# Patient Record
Sex: Female | Born: 1990 | Race: White | Hispanic: No | Marital: Single | Smoking: Never smoker
Health system: Southern US, Community
[De-identification: ages and names within clinical notes are randomized; demographics above are authoritative.]

## PROBLEM LIST (undated history)

## (undated) DIAGNOSIS — F419 Anxiety disorder, unspecified: Secondary | ICD-10-CM

## (undated) DIAGNOSIS — I10 Essential (primary) hypertension: Secondary | ICD-10-CM

## (undated) DIAGNOSIS — F32A Depression, unspecified: Secondary | ICD-10-CM

## (undated) DIAGNOSIS — R87629 Unspecified abnormal cytological findings in specimens from vagina: Secondary | ICD-10-CM

## (undated) DIAGNOSIS — E079 Disorder of thyroid, unspecified: Secondary | ICD-10-CM

## (undated) DIAGNOSIS — K219 Gastro-esophageal reflux disease without esophagitis: Secondary | ICD-10-CM

## (undated) HISTORY — DX: Depression, unspecified: F32.A

## (undated) HISTORY — PX: LEG SURGERY: SHX1003

## (undated) HISTORY — PX: CHOLECYSTECTOMY: SHX55

## (undated) HISTORY — DX: Unspecified abnormal cytological findings in specimens from vagina: R87.629

## (undated) HISTORY — DX: Disorder of thyroid, unspecified: E07.9

## (undated) HISTORY — PX: NO PAST SURGERIES: SHX2092

## (undated) HISTORY — DX: Anxiety disorder, unspecified: F41.9

## (undated) HISTORY — DX: Gastro-esophageal reflux disease without esophagitis: K21.9

---
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
## 2008-06-27 ENCOUNTER — Emergency Department (HOSPITAL_COMMUNITY): Admission: EM | Admit: 2008-06-27 | Discharge: 2008-06-27 | Payer: Self-pay | Admitting: Emergency Medicine

## 2009-02-10 ENCOUNTER — Other Ambulatory Visit: Admission: RE | Admit: 2009-02-10 | Discharge: 2009-02-10 | Payer: Self-pay | Admitting: Unknown Physician Specialty

## 2009-09-16 ENCOUNTER — Emergency Department (HOSPITAL_COMMUNITY): Admission: EM | Admit: 2009-09-16 | Discharge: 2009-09-16 | Payer: Self-pay | Admitting: Emergency Medicine

## 2009-12-19 ENCOUNTER — Emergency Department (HOSPITAL_COMMUNITY): Admission: EM | Admit: 2009-12-19 | Discharge: 2009-12-19 | Payer: Self-pay | Admitting: Emergency Medicine

## 2010-04-05 DIAGNOSIS — F909 Attention-deficit hyperactivity disorder, unspecified type: Secondary | ICD-10-CM | POA: Insufficient documentation

## 2010-04-05 DIAGNOSIS — F319 Bipolar disorder, unspecified: Secondary | ICD-10-CM | POA: Insufficient documentation

## 2010-04-06 LAB — URINALYSIS, ROUTINE W REFLEX MICROSCOPIC
Bilirubin Urine: NEGATIVE
Ketones, ur: NEGATIVE mg/dL
Nitrite: NEGATIVE
Specific Gravity, Urine: >1.03 — ABNORMAL HIGH (ref 1.005–1.030)
pH: 5.5 (ref 5.0–8.0)

## 2010-04-06 LAB — GLUCOSE, CAPILLARY: Glucose-Capillary: 87 mg/dL (ref 70–99)

## 2010-04-06 LAB — URINE MICROSCOPIC-ADD ON

## 2010-05-03 LAB — COMPREHENSIVE METABOLIC PANEL
BUN: 7 mg/dL (ref 6–23)
Chloride: 107 mEq/L (ref 96–112)
GFR calc non Af Amer: 55 mL/min — ABNORMAL LOW (ref >60)
Glucose, Bld: 126 mg/dL — ABNORMAL HIGH (ref 70–99)
Potassium: 3 mEq/L — ABNORMAL LOW (ref 3.5–5.1)
Total Bilirubin: 0.5 mg/dL (ref 0.3–1.2)

## 2010-05-03 LAB — URINALYSIS, ROUTINE W REFLEX MICROSCOPIC
Glucose, UA: NEGATIVE mg/dL
Nitrite: NEGATIVE
pH: 5.5 (ref 5.0–8.0)

## 2010-05-03 LAB — DIFFERENTIAL
Basophils Relative: 0 % (ref 0–1)
Eosinophils Relative: 1 % (ref 0–5)
Lymphs Abs: 1 10*3/uL (ref 0.7–4.0)
Monocytes Absolute: 0.3 10*3/uL (ref 0.1–1.0)
Monocytes Relative: 4 % (ref 3–12)

## 2010-05-03 LAB — CBC
Hemoglobin: 13.9 g/dL (ref 12.0–15.0)
Platelets: 210 10*3/uL (ref 150–400)
RBC: 4.61 MIL/uL (ref 3.87–5.11)
RDW: 13.1 % (ref 11.5–15.5)

## 2010-05-03 LAB — URINE MICROSCOPIC-ADD ON

## 2015-11-02 DIAGNOSIS — L989 Disorder of the skin and subcutaneous tissue, unspecified: Secondary | ICD-10-CM | POA: Insufficient documentation

## 2015-11-02 DIAGNOSIS — M722 Plantar fascial fibromatosis: Secondary | ICD-10-CM

## 2015-11-02 HISTORY — DX: Plantar fascial fibromatosis: M72.2

## 2017-05-01 DIAGNOSIS — E039 Hypothyroidism, unspecified: Secondary | ICD-10-CM | POA: Insufficient documentation

## 2017-11-18 ENCOUNTER — Emergency Department (HOSPITAL_COMMUNITY)
Admission: EM | Admit: 2017-11-18 | Discharge: 2017-11-18 | Disposition: A | Discharge status: Home / Self Care | Payer: Medicare Other | Attending: Emergency Medicine | Admitting: Emergency Medicine

## 2017-11-18 ENCOUNTER — Encounter (HOSPITAL_COMMUNITY): Payer: Self-pay | Admitting: *Deleted

## 2017-11-18 ENCOUNTER — Other Ambulatory Visit: Payer: Self-pay

## 2017-11-18 DIAGNOSIS — M5431 Sciatica, right side: Secondary | ICD-10-CM | POA: Insufficient documentation

## 2017-11-18 DIAGNOSIS — M545 Low back pain: Secondary | ICD-10-CM | POA: Diagnosis present

## 2017-11-18 MED ORDER — METHOCARBAMOL 500 MG PO TABS: 500.0000 mg | ORAL_TABLET | Freq: Two times a day (BID) | ORAL | 0 refills | Status: DC

## 2017-11-18 MED ORDER — DICLOFENAC SODIUM 50 MG PO TBEC: 50.0000 mg | DELAYED_RELEASE_TABLET | Freq: Two times a day (BID) | ORAL | 0 refills | Status: DC

## 2017-11-18 MED ORDER — PREDNISONE 10 MG PO TABS: ORAL_TABLET | ORAL | 0 refills | Status: DC

## 2017-11-18 NOTE — ED Triage Notes (Addendum)
Pt c/o lower back pain that radiates down into buttocks and legs. Pt c/o numbness and tingling in right leg. Symptoms started 4 weeks ago. Denies injury and urinary symptoms. Pt was seen at Urgent Care x 1 week ago with unknown diagnosis.

## 2017-11-18 NOTE — Discharge Instructions (Addendum)
Return if any problems.

## 2017-11-18 NOTE — ED Provider Notes (Signed)
[facility] EMERGENCY DEPARTMENT Provider Note   CSN: [id number] Arrival date & time: 11/18/17  1757     History   Chief Complaint Chief Complaint  Patient presents with  . Back Pain    HPI Madison Mcgrath is a 27 y.o. female.  The history is provided by the patient. No language interpreter was used.  Back Pain   This is a new problem. Episode onset: 4 weeks. The problem occurs constantly. The problem has been rapidly worsening. The pain is associated with no known injury. The pain is present in the lumbar spine. The pain does not radiate. The pain is moderate. She has tried nothing for the symptoms. The treatment provided moderate relief.    History reviewed. No pertinent past medical history.  There are no active problems to display for this patient.   Past Surgical History:  Procedure Laterality Date  . CHOLECYSTECTOMY       OB History   None      Home Medications    Prior to Admission medications   Not on File    Family History No family history on file.  Social History Social History   Tobacco Use  . Smoking status: Never Smoker  . Smokeless tobacco: Never Used  Substance Use Topics  . Alcohol use: Never    Frequency: Never  . Drug use: Never     Allergies   Patient has no known allergies.   Review of Systems Review of Systems  Musculoskeletal: Positive for back pain.  All other systems reviewed and are negative.    Physical Exam Updated Vital Signs BP (!) 147/90   Pulse (!) 107   Temp 98.3 F (36.8 C) (Oral)   Resp 18   Ht 5\' 5"  (1.651 m)   Wt (!) 151.5 kg   SpO2 98%   BMI 55.58 kg/m   Physical Exam  Constitutional: She appears well-developed and well-nourished.  Eyes: Pupils are equal, round, and reactive to light.  Neck: Normal range of motion.  Cardiovascular: Normal rate.  Pulmonary/Chest: Effort normal.  Abdominal: Soft.  Musculoskeletal: Normal range of motion.  Diffusely tender ls spine,  Pain with movement, dtr's  normal. Tender sciatic notch  Neurological: She is alert.  Skin: Skin is warm.  Psychiatric: She has a normal mood and affect.  Nursing note and vitals reviewed.    ED Treatments / Results  Labs (all labs ordered are listed, but only abnormal results are displayed) Labs Reviewed - No data to display  EKG None  Radiology No results found.  Procedures Procedures (including critical care time)  Medications Ordered in ED Medications - No data to display   Initial Impression / Assessment and Plan / ED Course  I have reviewed the triage vital signs and the nursing notes.  Pertinent labs & imaging results that were available during my care of the patient were reviewed by me and considered in my medical decision making (see chart for details).    MDm  I will try prednisone, robaxin and voltaren.  Pt advised to follow up if symptoms persist.   Final Clinical Impressions(s) / ED Diagnoses   Final diagnoses:  Sciatica of right side    ED Discharge Orders         Ordered    predniSONE (DELTASONE) 10 MG tablet     11/18/17 1905    methocarbamol (ROBAXIN) 500 MG tablet  2 times daily     11/18/17 1905    diclofenac (VOLTAREN) 50  MG EC tablet  2 times daily     11/18/17 1905        An After Visit Summary was printed and given to the patient.   Elson Areas, [place] 11/19/17 1536    Donnetta Hutching, MD 11/19/17 [id number]

## 2017-11-22 ENCOUNTER — Ambulatory Visit (INDEPENDENT_AMBULATORY_CARE_PROVIDER_SITE_OTHER): Payer: Medicare Other | Admitting: Obstetrics and Gynecology

## 2017-11-22 ENCOUNTER — Encounter: Payer: Self-pay | Admitting: Obstetrics and Gynecology

## 2017-11-22 DIAGNOSIS — F529 Unspecified sexual dysfunction not due to a substance or known physiological condition: Secondary | ICD-10-CM

## 2017-11-22 HISTORY — DX: Unspecified sexual dysfunction not due to a substance or known physiological condition: F52.9
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
## 2017-11-22 NOTE — Patient Instructions (Signed)

## 2018-07-31 ENCOUNTER — Other Ambulatory Visit: Payer: Self-pay

## 2018-07-31 ENCOUNTER — Emergency Department (HOSPITAL_COMMUNITY)
Admission: EM | Admit: 2018-07-31 | Discharge: 2018-08-01 | Disposition: A | Discharge status: Home / Self Care | Payer: 59 | Attending: Emergency Medicine | Admitting: Emergency Medicine

## 2018-07-31 ENCOUNTER — Encounter (HOSPITAL_COMMUNITY): Payer: Self-pay | Admitting: *Deleted

## 2018-07-31 DIAGNOSIS — Z79899 Other long term (current) drug therapy: Secondary | ICD-10-CM | POA: Diagnosis not present

## 2018-07-31 DIAGNOSIS — E039 Hypothyroidism, unspecified: Secondary | ICD-10-CM | POA: Diagnosis not present

## 2018-07-31 DIAGNOSIS — M791 Myalgia, unspecified site: Secondary | ICD-10-CM | POA: Insufficient documentation

## 2018-07-31 DIAGNOSIS — R531 Weakness: Secondary | ICD-10-CM | POA: Diagnosis present

## 2018-07-31 DIAGNOSIS — I10 Essential (primary) hypertension: Secondary | ICD-10-CM | POA: Diagnosis not present

## 2018-07-31 DIAGNOSIS — R079 Chest pain, unspecified: Secondary | ICD-10-CM | POA: Insufficient documentation

## 2018-07-31 HISTORY — DX: Essential (primary) hypertension: I10

## 2018-07-31 LAB — URINALYSIS, ROUTINE W REFLEX MICROSCOPIC
Bilirubin Urine: NEGATIVE
Glucose, UA: NEGATIVE mg/dL
Hgb urine dipstick: NEGATIVE
Ketones, ur: NEGATIVE mg/dL
Nitrite: NEGATIVE
Protein, ur: NEGATIVE mg/dL
Specific Gravity, Urine: 1.031 — ABNORMAL HIGH (ref 1.005–1.030)
pH: 5 (ref 5.0–8.0)

## 2018-07-31 LAB — CBC
HCT: 44.8 % (ref 36.0–46.0)
Hemoglobin: 13.9 g/dL (ref 12.0–15.0)
MCH: 26.4 pg (ref 26.0–34.0)
MCHC: 31 g/dL (ref 30.0–36.0)
MCV: 85.2 fL (ref 80.0–100.0)
Platelets: 280 10*3/uL (ref 150–400)
RBC: 5.26 MIL/uL — ABNORMAL HIGH (ref 3.87–5.11)
RDW: 13.7 % (ref 11.5–15.5)
WBC: 11.5 10*3/uL — ABNORMAL HIGH (ref 4.0–10.5)
nRBC: 0 % (ref 0.0–0.2)

## 2018-07-31 LAB — BASIC METABOLIC PANEL
Anion gap: 10 (ref 5–15)
BUN: 11 mg/dL (ref 6–20)
CO2: 22 mmol/L (ref 22–32)
Calcium: 9.2 mg/dL (ref 8.9–10.3)
Chloride: 108 mmol/L (ref 98–111)
Creatinine, Ser: 0.77 mg/dL (ref 0.44–1.00)
GFR calc Af Amer: >60 mL/min (ref >60)
GFR calc non Af Amer: >60 mL/min (ref >60)
Glucose, Bld: 123 mg/dL — ABNORMAL HIGH (ref 70–99)
Potassium: 3.9 mmol/L (ref 3.5–5.1)
Sodium: 140 mmol/L (ref 135–145)

## 2018-07-31 LAB — CK: Total CK: 98 U/L (ref 38–234)

## 2018-07-31 LAB — CBG MONITORING, ED: Glucose-Capillary: 116 mg/dL — ABNORMAL HIGH (ref 70–99)

## 2018-07-31 LAB — POC URINE PREG, ED: Preg Test, Ur: NEGATIVE

## 2018-07-31 MED ORDER — SODIUM CHLORIDE 0.9 % IV BOLUS
1000.0000 mL | Freq: Once | INTRAVENOUS | Status: AC
  Administered 2018-07-31: 1000 mL via INTRAVENOUS

## 2018-07-31 MED ORDER — SODIUM CHLORIDE 0.9% FLUSH: 3.0000 mL | Freq: Once | INTRAVENOUS | Status: DC

## 2018-07-31 NOTE — ED Triage Notes (Signed)
Pt states she has been moving into a new place for the last 2 days and she has not been feeling well; pt states the place she is living does not have air conditioning and she feels like she may be dehydrated; pt also c/o chest pain

## 2018-08-01 ENCOUNTER — Emergency Department (HOSPITAL_COMMUNITY): Payer: 59

## 2018-08-01 DIAGNOSIS — R531 Weakness: Secondary | ICD-10-CM | POA: Diagnosis not present

## 2018-08-01 LAB — TROPONIN I (HIGH SENSITIVITY)
Troponin I (High Sensitivity): <2 ng/L (ref <18)
Troponin I (High Sensitivity): <2 ng/L (ref <18)

## 2018-08-01 MED ORDER — DICLOFENAC SODIUM 75 MG PO TBEC: 75.0000 mg | DELAYED_RELEASE_TABLET | Freq: Two times a day (BID) | ORAL | 0 refills | Status: DC

## 2018-08-01 MED ORDER — KETOROLAC TROMETHAMINE 30 MG/ML IJ SOLN
15.0000 mg | Freq: Once | INTRAMUSCULAR | Status: AC
  Administered 2018-08-01: 15 mg via INTRAVENOUS
  Filled 2018-08-01: qty 1

## 2018-08-01 NOTE — ED Provider Notes (Signed)
[facility] EMERGENCY DEPARTMENT Provider Note   CSN: [id number] Arrival date & time: 07/31/18  1858    History   Chief Complaint Chief Complaint  Patient presents with  . Weakness    HPI Madison Mcgrath is a 28 y.o. female with a history of HTN and hypothyroidism presenting with suspicion of dehydration or heat illness. She is in the process of moving into a new home and has been more active than normal, having to go up and down flights of steps multiple times in a hot home that is not air conditioned.  This is more exercise than she is used to and feels dehydrated and weak.  She has been sweating all day and reports decreased urination but her urine has not been especially dark.  She also developed mild sternal pressure in her chest while trying to rest this evening. Her pain is not pleuritic. She denies sob, cough, fevers, no n/v.  She does report bilateral leg pain, probably from exertion.       Weakness Associated symptoms: arthralgias   Associated symptoms: no shortness of breath     Past Medical History:  Diagnosis Date  . Hypertension   . Thyroid disease     Patient Active Problem List   Diagnosis Date Noted  . 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 CHOLECYSTECTOMY       OB History    Gravida  0   Para  0   Term  0   Preterm  0   AB  0   Living  0     SAB  0   TAB  0   Ectopic  0   Multiple  0   Live Births  0            Home Medications    Prior to Admission medications   Medication Sig Start Date End Date Taking? Authorizing Provider  amLODipine (NORVASC) 5 MG tablet TAKE 1 TABLET BY MOUTH EVERY DAY 02/14/17   [provider]  diclofenac (VOLTAREN) 75 MG EC tablet Take 1 tablet (75 mg total) by mouth 2 (two) times daily. 08/01/18   Burgess AmorIdol, Tamsin Nader, PA-C  lamoTRIgine (LAMICTAL) 200 MG tablet TK 1 T PO  DAILY 11/10/17   [provider]  levothyroxine (SYNTHROID,  LEVOTHROID) 50 MCG tablet Take by mouth. 03/09/17   [provider]  traZODone (DESYREL) 150 MG tablet Take by mouth.    [provider]    Family History History reviewed. No pertinent family history.  Social History Social History   Tobacco Use  . Smoking status: Never Smoker  . Smokeless tobacco: Never Used  Substance Use Topics  . Alcohol use: Never    Frequency: Never  . Drug use: Never     Allergies   Bee venom   Review of Systems Review of Systems  Constitutional: Positive for diaphoresis and fatigue.  HENT: Negative.   Respiratory: Positive for chest tightness. Negative for shortness of breath and wheezing.   Gastrointestinal: Negative.   Genitourinary: Positive for decreased urine volume.  Musculoskeletal: Positive for arthralgias.  Neurological: Positive for weakness.     Physical Exam Updated Vital Signs BP 126/73   Pulse 86   Temp 98.2 F (36.8 C) (Oral)   Resp 18   Ht 5\' 5"  (1.651 m)   Wt (!) 158.8 kg   SpO2 100%   BMI 58.24 kg/m   Physical Exam Vitals signs  and nursing note reviewed.  Constitutional:      Appearance: She is well-developed. She is obese.  HENT:     Head: Normocephalic and atraumatic.     Mouth/Throat:     Mouth: Mucous membranes are dry.  Eyes:     Conjunctiva/sclera: Conjunctivae normal.  Neck:     Musculoskeletal: Normal range of motion.  Cardiovascular:     Rate and Rhythm: Normal rate and regular rhythm.     Heart sounds: Normal heart sounds.  Pulmonary:     Effort: Pulmonary effort is normal.     Breath sounds: Normal breath sounds. No wheezing.  Abdominal:     General: Bowel sounds are normal.     Palpations: Abdomen is soft.     Tenderness: There is no abdominal tenderness.  Musculoskeletal: Normal range of motion.  Skin:    General: Skin is warm and dry.  Neurological:     Mental Status: She is alert.      ED Treatments / Results  Labs (all labs ordered are listed, but only  abnormal results are displayed) Labs Reviewed  BASIC METABOLIC PANEL - Abnormal; Notable for the following components:      Result Value   Glucose, Bld 123 (*)    All other components within normal limits  CBC - Abnormal; Notable for the following components:   WBC 11.5 (*)    RBC 5.26 (*)    All other components within normal limits  URINALYSIS, ROUTINE W REFLEX MICROSCOPIC - Abnormal; Notable for the following components:   APPearance HAZY (*)    Specific Gravity, Urine 1.031 (*)    Leukocytes,Ua TRACE (*)    Bacteria, UA RARE (*)    All other components within normal limits  CBG MONITORING, ED - Abnormal; Notable for the following components:   Glucose-Capillary 116 (*)    All other components within normal limits  CK  TROPONIN I (HIGH SENSITIVITY)  TROPONIN I (HIGH SENSITIVITY)  POC URINE PREG, ED    EKG EKG Interpretation  Date/Time:  Tuesday July 31 2018 19:46:03 EDT Ventricular Rate:  88 PR Interval:  142 QRS Duration: 84 QT Interval:  376 QTC Calculation: 454 R Axis:   102 Text Interpretation:  Normal sinus rhythm Rightward axis Borderline ECG When compared with ECG of 06/22/1994, No significant change was found Confirmed by Delora Fuel ([id number]) on 08/01/2018 12:21:41 AM   Radiology Dg Chest Portable 1 View  Result Date: 08/01/2018 CLINICAL DATA:  Chest pain. EXAM: PORTABLE CHEST 1 VIEW COMPARISON:  None. FINDINGS: The heart size is enlarged. There is no pneumothorax. No large pleural effusion. There is mild vascular congestion without overt pulmonary edema. There is no acute osseous abnormality. IMPRESSION: 1. No acute cardiopulmonary process. 2. Mild cardiomegaly. Electronically Signed   By: Constance Holster M.D.   On: 08/01/2018 01:00    Procedures Procedures (including critical care time)  Medications Ordered in ED Medications  sodium chloride flush (NS) 0.9 % injection 3 mL (3 mLs Intravenous Not Given 07/31/18 2231)  sodium chloride 0.9 % bolus 1,000 mL (0  mLs Intravenous Stopped 08/01/18 0156)  ketorolac (TORADOL) 30 MG/ML injection 15 mg (15 mg Intravenous Given 08/01/18 0016)     Initial Impression / Assessment and Plan / ED Course  I have reviewed the triage vital signs and the nursing notes.  Pertinent labs & imaging results that were available during my care of the patient were reviewed by me and considered in my medical decision making (see chart  for details).        Pt with fatigue, chest pressure of unclear etiology, bilateral leg pain suspected from overuse. No sob, no pleuritic sx, doubt PE, VSS.  Ekg, cxr and delta trops low.  She is scheduled to see a new pcp in 2 weeks. Advised rest, increased fluid intake. She asked for diclofenac for her leg pain which she has used in the past for orthopedic pain and has been helpful.  She was given toradol here with relief of her leg pain.   Final Clinical Impressions(s) / ED Diagnoses   Final diagnoses:  Generalized weakness  Muscular aches  Chest pain, unspecified type    ED Discharge Orders         Ordered    diclofenac (VOLTAREN) 75 MG EC tablet  2 times daily     08/01/18 0249           Burgess Amordol, Maycol Hoying, PA-C 08/01/18 0251    Sabas SousBero, Michael M, MD 08/06/18 [phone]

## 2018-08-01 NOTE — Discharge Instructions (Signed)
Your lab tests, chest xray and ekg are stable tonight.  Followup with your doctor as planned.  Rest and drink plenty of fluids.  You may take the diclofenac for your leg pain.

## 2018-08-14 ENCOUNTER — Emergency Department (HOSPITAL_COMMUNITY)
Admission: EM | Admit: 2018-08-14 | Discharge: 2018-08-14 | Disposition: A | Discharge status: Home / Self Care | Payer: 59 | Attending: Emergency Medicine | Admitting: Emergency Medicine

## 2018-08-14 ENCOUNTER — Emergency Department (HOSPITAL_COMMUNITY): Payer: 59

## 2018-08-14 ENCOUNTER — Encounter (HOSPITAL_COMMUNITY): Payer: Self-pay | Admitting: *Deleted

## 2018-08-14 ENCOUNTER — Other Ambulatory Visit: Payer: Self-pay

## 2018-08-14 DIAGNOSIS — S46911A Strain of unspecified muscle, fascia and tendon at shoulder and upper arm level, right arm, initial encounter: Secondary | ICD-10-CM | POA: Insufficient documentation

## 2018-08-14 DIAGNOSIS — S76919A Strain of unspecified muscles, fascia and tendons at thigh level, unspecified thigh, initial encounter: Secondary | ICD-10-CM | POA: Insufficient documentation

## 2018-08-14 DIAGNOSIS — Y999 Unspecified external cause status: Secondary | ICD-10-CM | POA: Diagnosis not present

## 2018-08-14 DIAGNOSIS — I10 Essential (primary) hypertension: Secondary | ICD-10-CM | POA: Diagnosis not present

## 2018-08-14 DIAGNOSIS — W010XXA Fall on same level from slipping, tripping and stumbling without subsequent striking against object, initial encounter: Secondary | ICD-10-CM | POA: Insufficient documentation

## 2018-08-14 DIAGNOSIS — Y9372 Activity, wrestling: Secondary | ICD-10-CM | POA: Insufficient documentation

## 2018-08-14 DIAGNOSIS — Y92009 Unspecified place in unspecified non-institutional (private) residence as the place of occurrence of the external cause: Secondary | ICD-10-CM | POA: Insufficient documentation

## 2018-08-14 DIAGNOSIS — Z79899 Other long term (current) drug therapy: Secondary | ICD-10-CM | POA: Diagnosis not present

## 2018-08-14 DIAGNOSIS — S4991XA Unspecified injury of right shoulder and upper arm, initial encounter: Secondary | ICD-10-CM | POA: Diagnosis present

## 2018-08-14 LAB — POC URINE PREG, ED: Preg Test, Ur: NEGATIVE

## 2018-08-14 MED ORDER — DICLOFENAC SODIUM 75 MG PO TBEC: 75.0000 mg | DELAYED_RELEASE_TABLET | Freq: Two times a day (BID) | ORAL | 0 refills | Status: DC

## 2018-08-14 MED ORDER — CYCLOBENZAPRINE HCL 10 MG PO TABS: 10.0000 mg | ORAL_TABLET | Freq: Three times a day (TID) | ORAL | 0 refills | Status: DC

## 2018-08-14 NOTE — Discharge Instructions (Addendum)
Your x-rays are negative for any acute bony injury or dislocation.  I suspect you have strained some muscles which should improve with time.  You may apply a heating pad to your sites of pain for 20 minutes several times daily.  Use the medications prescribed for muscle spasm and anti-inflammatory pain relief.  Plan to follow-up with your new primary doctor this week as you have scheduled.  Your blood pressure is elevated today and may be secondary to this painful condition, however make sure you are taking your blood pressure medicine daily.
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
## 2018-08-14 NOTE — ED Notes (Signed)
Patient transported to X-ray 
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
## 2018-08-14 NOTE — ED Notes (Signed)
Patient back from x-ray 

## 2018-08-24 ENCOUNTER — Telehealth: Payer: Self-pay

## 2018-08-24 NOTE — Telephone Encounter (Signed)
Proficient Referral - Left Message.  

## 2018-09-03 ENCOUNTER — Telehealth: Payer: Self-pay | Admitting: Women's Health

## 2018-09-03 NOTE — Telephone Encounter (Signed)

## 2018-09-04 ENCOUNTER — Encounter: Payer: 59 | Admitting: Women's Health

## 2018-10-04 ENCOUNTER — Telehealth (INDEPENDENT_AMBULATORY_CARE_PROVIDER_SITE_OTHER): Payer: Self-pay

## 2018-10-05 ENCOUNTER — Ambulatory Visit (HOSPITAL_COMMUNITY): Payer: 59 | Admitting: Psychiatry

## 2018-10-08 NOTE — Telephone Encounter (Signed)
Message send & reviewed.

## 2018-10-12 ENCOUNTER — Telehealth (INDEPENDENT_AMBULATORY_CARE_PROVIDER_SITE_OTHER): Payer: Self-pay

## 2018-10-15 ENCOUNTER — Telehealth: Payer: Self-pay | Admitting: Women's Health

## 2018-10-15 NOTE — Telephone Encounter (Signed)
COMPLETED

## 2018-10-15 NOTE — Telephone Encounter (Signed)
Called patient regarding appointment and the following message was left: ° ° °We have you scheduled for an upcoming appointment at our office. At this time, patients are encouraged to come alone to their visits whenever possible, however, a support person, over age 30, may accompany you to your appointment if assistance is needed for safety or care concerns. Otherwise, support persons should remain outside until the visit is complete.  ° °We ask if you have had any exposure to anyone suspected or confirmed of having COVID-19 or if you are experiencing any of the following, to call and reschedule your appointment: fever, cough, shortness of breath, muscle pain, diarrhea, rash, vomiting, abdominal pain, red eye, weakness, bruising, bleeding, joint pain, or a severe headache.  ° °Please know we will ask you these questions or similar questions when you arrive for your appointment and again it’s how we are keeping everyone safe.   ° °Also,to keep you safe, please use the provided hand sanitizer when you enter the office. We are asking everyone in the office to wear a mask to help prevent the spread of °germs. If you have a mask of your own, please wear it to your appointment, if not, we are happy to provide one for you. ° °Thank you for understanding and your cooperation.  ° ° °[facility] Staff ° ° ° ° ° °

## 2018-10-16 ENCOUNTER — Other Ambulatory Visit (HOSPITAL_COMMUNITY)
Admission: RE | Admit: 2018-10-16 | Discharge: 2018-10-16 | Disposition: A | Discharge status: Home / Self Care | Payer: 59 | Source: Ambulatory Visit | Attending: Obstetrics & Gynecology | Admitting: Obstetrics & Gynecology

## 2018-10-16 ENCOUNTER — Ambulatory Visit (INDEPENDENT_AMBULATORY_CARE_PROVIDER_SITE_OTHER): Payer: 59 | Admitting: Women's Health

## 2018-10-16 ENCOUNTER — Encounter: Payer: 59 | Admitting: Women's Health

## 2018-10-16 ENCOUNTER — Other Ambulatory Visit: Payer: Self-pay

## 2018-10-16 ENCOUNTER — Encounter: Payer: Self-pay | Admitting: Women's Health

## 2018-10-16 VITALS — Ht 65.0 in | Wt 364.0 lb

## 2018-10-16 DIAGNOSIS — Z01419 Encounter for gynecological examination (general) (routine) without abnormal findings: Secondary | ICD-10-CM | POA: Diagnosis not present

## 2018-10-16 DIAGNOSIS — R37 Sexual dysfunction, unspecified: Secondary | ICD-10-CM

## 2018-10-16 DIAGNOSIS — Z62810 Personal history of physical and sexual abuse in childhood: Secondary | ICD-10-CM
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
## 2018-10-16 NOTE — Patient Instructions (Signed)
[email or web address] [phone]

## 2018-10-17 LAB — CYTOLOGY - PAP
Chlamydia: NEGATIVE
Diagnosis: NEGATIVE
High risk HPV: NEGATIVE
Molecular Disclaimer: 56
Molecular Disclaimer: DETECTED
Molecular Disclaimer: NEGATIVE
Molecular Disclaimer: NORMAL
Neisseria Gonorrhea: NEGATIVE

## 2018-10-18 LAB — CERVICOVAGINAL ANCILLARY ONLY
Chlamydia: NEGATIVE
HPV: NOT DETECTED
Neisseria Gonorrhea: NEGATIVE

## 2018-11-08 ENCOUNTER — Ambulatory Visit (INDEPENDENT_AMBULATORY_CARE_PROVIDER_SITE_OTHER): Payer: 59 | Admitting: Internal Medicine

## 2018-11-30 ENCOUNTER — Telehealth: Payer: 59 | Admitting: Nurse Practitioner

## 2018-11-30 DIAGNOSIS — R059 Cough, unspecified: Secondary | ICD-10-CM

## 2018-11-30 DIAGNOSIS — R05 Cough: Secondary | ICD-10-CM

## 2018-11-30 MED ORDER — BENZONATATE 100 MG PO CAPS: 100.0000 mg | ORAL_CAPSULE | Freq: Three times a day (TID) | ORAL | 0 refills | Status: DC | PRN

## 2018-11-30 NOTE — Progress Notes (Signed)

## 2019-02-05 ENCOUNTER — Encounter (HOSPITAL_COMMUNITY): Payer: Self-pay | Admitting: Emergency Medicine

## 2019-02-05 ENCOUNTER — Other Ambulatory Visit: Payer: Self-pay

## 2019-02-05 ENCOUNTER — Emergency Department (HOSPITAL_COMMUNITY)
Admission: EM | Admit: 2019-02-05 | Discharge: 2019-02-05 | Disposition: A | Discharge status: Home / Self Care | Payer: 59 | Attending: Emergency Medicine | Admitting: Emergency Medicine

## 2019-02-05 ENCOUNTER — Telehealth: Payer: Self-pay | Admitting: *Deleted

## 2019-02-05 DIAGNOSIS — I10 Essential (primary) hypertension: Secondary | ICD-10-CM | POA: Diagnosis not present

## 2019-02-05 DIAGNOSIS — N939 Abnormal uterine and vaginal bleeding, unspecified: Secondary | ICD-10-CM

## 2019-02-05 DIAGNOSIS — Z79899 Other long term (current) drug therapy: Secondary | ICD-10-CM | POA: Insufficient documentation

## 2019-02-05 LAB — CBC WITH DIFFERENTIAL/PLATELET
Abs Immature Granulocytes: 0.06 10*3/uL (ref 0.00–0.07)
Basophils Absolute: 0.1 10*3/uL (ref 0.0–0.1)
Basophils Relative: 1 %
Eosinophils Absolute: 0 10*3/uL (ref 0.0–0.5)
Eosinophils Relative: 0 %
HCT: 48.2 % — ABNORMAL HIGH (ref 36.0–46.0)
Hemoglobin: 14.6 g/dL (ref 12.0–15.0)
Immature Granulocytes: 1 %
Lymphocytes Relative: 34 %
Lymphs Abs: 4.1 10*3/uL — ABNORMAL HIGH (ref 0.7–4.0)
MCH: 26.5 pg (ref 26.0–34.0)
MCHC: 30.3 g/dL (ref 30.0–36.0)
MCV: 87.6 fL (ref 80.0–100.0)
Monocytes Absolute: 0.6 10*3/uL (ref 0.1–1.0)
Monocytes Relative: 5 %
Neutro Abs: 7.3 10*3/uL (ref 1.7–7.7)
Neutrophils Relative %: 59 %
Platelets: 318 10*3/uL (ref 150–400)
RBC: 5.5 MIL/uL — ABNORMAL HIGH (ref 3.87–5.11)
RDW: 13.3 % (ref 11.5–15.5)
WBC: 12.1 10*3/uL — ABNORMAL HIGH (ref 4.0–10.5)
nRBC: 0 % (ref 0.0–0.2)

## 2019-02-05 LAB — BASIC METABOLIC PANEL
Anion gap: 11 (ref 5–15)
BUN: 13 mg/dL (ref 6–20)
CO2: 24 mmol/L (ref 22–32)
Calcium: 9.4 mg/dL (ref 8.9–10.3)
Chloride: 105 mmol/L (ref 98–111)
Creatinine, Ser: 0.66 mg/dL (ref 0.44–1.00)
GFR calc Af Amer: >60 mL/min (ref >60)
GFR calc non Af Amer: >60 mL/min (ref >60)
Glucose, Bld: 118 mg/dL — ABNORMAL HIGH (ref 70–99)
Potassium: 3.8 mmol/L (ref 3.5–5.1)
Sodium: 140 mmol/L (ref 135–145)

## 2019-02-05 LAB — PREGNANCY, URINE: Preg Test, Ur: NEGATIVE

## 2019-02-05 NOTE — ED Notes (Signed)
Out of bed to br 

## 2019-02-05 NOTE — ED Notes (Signed)
Pt reports she has had a vag bleed that is heavier than usual   Implant BC x 1 year   Reports has not had money for pads  Nor has she had any luck calling her GYN provider stating she has left messages but no return call   Here for eval or her heavy vag bleeding

## 2019-02-05 NOTE — Telephone Encounter (Signed)
Tried to return call. [phone]#, mailbox was full; 214#, voice mail not set up. JSY

## 2019-02-05 NOTE — ED Notes (Signed)
JI in to assess 

## 2019-02-05 NOTE — Telephone Encounter (Signed)
Patient left message that she has normally had a light period but now has a heavy period and would like to discuss. Please call.

## 2019-02-05 NOTE — ED Triage Notes (Addendum)
Pt reports period started on 1/9. Pt reports this period is heavier than usual and has "several clots." pt denies abd pain but reports general malaise and fatigue. Pt reports has implant for birth control.

## 2019-02-05 NOTE — ED Provider Notes (Signed)
[facility] EMERGENCY DEPARTMENT Provider Note   CSN: [id number] Arrival date & time: 02/05/19  1748     History Chief Complaint  Patient presents with  . Vaginal Bleeding    Madison Mcgrath is a 29 y.o. female with a history of hypertension and thyroid disease presenting with a heavier than normal.  Including passage of blood clots which started 2 mornings ago.  She has a Nexplanon for birth control and states she typically has very light if any.,  Stating in the past 6 months she has only had 1 or 2 episodes of transient spotting without a true period.  She denies abdominal or pelvic pain or cramping, also denies dizziness or weakness.  She reports having to change her pad every 1-2 hours for the past 12 hours.  [facility], states she attempted to call but has not heard back.  She has had no treatments prior to arrival for her symptoms.  She denies possibility of pregnancy, reports she is not sexually active.  Denies vaginal discharge.  The history is provided by the patient.       Past Medical History:  Diagnosis Date  . Hypertension   . Thyroid disease     Patient Active Problem List   Diagnosis Date Noted  . 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 CHOLECYSTECTOMY       OB History    Gravida  0   Para  0   Term  0   Preterm  0   AB  0   Living  0     SAB  0   TAB  0   Ectopic  0   Multiple  0   Live Births  0           No family history on file.  Social History   Tobacco Use  . Smoking status: Never Smoker  . Smokeless tobacco: Never Used  Substance Use Topics  . Alcohol use: Never  . Drug use: Never    Home Medications Prior to Admission medications   Medication Sig Start Date End Date Taking? Authorizing Provider  amLODipine (NORVASC) 5 MG tablet TAKE 1 TABLET BY MOUTH EVERY DAY 02/14/17   [provider]  benzonatate (TESSALON PERLES) 100 MG  capsule Take 1 capsule (100 mg total) by mouth 3 (three) times daily as needed. 11/30/18   Daphine Deutscher Mary-Margaret, FNP  cyclobenzaprine (FLEXERIL) 10 MG tablet Take 1 tablet (10 mg total) by mouth 3 (three) times daily. 08/14/18   Burgess Amor, PA-C  diclofenac (VOLTAREN) 75 MG EC tablet Take 1 tablet (75 mg total) by mouth 2 (two) times daily. 08/14/18   Burgess Amor, PA-C  famotidine (PEPCID) 20 MG tablet  08/16/18   [provider]  lamoTRIgine (LAMICTAL) 200 MG tablet TK 1 T PO  DAILY 11/10/17   [provider]  levothyroxine (SYNTHROID, LEVOTHROID) 50 MCG tablet Take by mouth. 03/09/17   [provider]  traZODone (DESYREL) 150 MG tablet Take by mouth.    [provider]  VRAYLAR capsule  08/08/18   [provider]    Allergies    Bee venom  Review of Systems   Review of Systems  Constitutional: Negative for fever.  HENT: Negative for congestion and sore throat.   Eyes: Negative.   Respiratory: Negative for chest tightness and shortness of breath.   Cardiovascular: Negative for chest pain.  Gastrointestinal:  Negative for abdominal pain and nausea.  Genitourinary: Positive for vaginal bleeding. Negative for pelvic pain, vaginal discharge and vaginal pain.  Musculoskeletal: Negative for arthralgias, joint swelling and neck pain.  Skin: Negative.  Negative for rash and wound.  Neurological: Negative for dizziness, weakness, light-headedness, numbness and headaches.  Psychiatric/Behavioral: Negative.     Physical Exam Updated Vital Signs BP (!) 146/93 (BP Location: Right Wrist)   Pulse 98   Temp 98.9 F (37.2 C) (Oral)   Resp 16   Ht 5\' 5"  (1.651 m)   Wt (!) 158.8 kg   LMP 02/02/2019   SpO2 100%   BMI 58.24 kg/m   Physical Exam Vitals and nursing note reviewed. Exam conducted with a chaperone present.  Constitutional:      Appearance: She is well-developed. She is obese.  HENT:     Head: Normocephalic and atraumatic.  Eyes:      Conjunctiva/sclera: Conjunctivae normal.  Cardiovascular:     Rate and Rhythm: Normal rate and regular rhythm.     Heart sounds: Normal heart sounds.  Pulmonary:     Effort: Pulmonary effort is normal.     Breath sounds: Normal breath sounds. No wheezing.  Abdominal:     General: Bowel sounds are normal.     Palpations: Abdomen is soft.     Tenderness: There is no abdominal tenderness.  Genitourinary:    General: Normal vulva.     Vagina: Normal. No signs of injury.     Cervix: No cervical motion tenderness, discharge or erythema.     Uterus: Normal. Not tender.      Adnexa: Right adnexa normal and left adnexa normal.       Right: No tenderness.         Left: No tenderness.       Comments: Blood and small clot noted at cervical os.  Musculoskeletal:        General: Normal range of motion.     Cervical back: Normal range of motion.  Skin:    General: Skin is warm and dry.  Neurological:     Mental Status: She is alert.     ED Results / Procedures / Treatments   Labs (all labs ordered are listed, but only abnormal results are displayed) Labs Reviewed  CBC WITH DIFFERENTIAL/PLATELET - Abnormal; Notable for the following components:      Result Value   WBC 12.1 (*)    RBC 5.50 (*)    HCT 48.2 (*)    Lymphs Abs 4.1 (*)    All other components within normal limits  BASIC METABOLIC PANEL - Abnormal; Notable for the following components:   Glucose, Bld 118 (*)    All other components within normal limits  PREGNANCY, URINE    EKG None  Radiology No results found.  Procedures Procedures (including critical care time)  Medications Ordered in ED Medications - No data to display  ED Course  I have reviewed the triage vital signs and the nursing notes.  Pertinent labs & imaging results that were available during my care of the patient were reviewed by me and considered in my medical decision making (see chart for details).    MDM Rules/Calculators/A&P                       Pt with abnormal vaginal bleeding 3 days duration with normal hgb level and no weakness or dizziness which is reassuring.  Patient is probably simply having a heavier  than normal menses  Given her multiple months of missed cycles.  She is not pregnant.  No risk factors for STDs.  She was encouraged a follow-up conversation with her gynecologist if her symptoms persist beyond 7 days.  At this point there is no indication for intervention as she is asymptomatic and not anemic.  She was given strict return precautions however if symptoms worsen or she develops weakness or dizziness.  Final Clinical Impression(s) / ED Diagnoses Final diagnoses:  Abnormal uterine bleeding    Rx / DC Orders ED Discharge Orders    None       Victoriano Lain 02/07/19 0114    Maia Plan, MD 02/07/19 1252

## 2019-02-06 ENCOUNTER — Encounter: Payer: Self-pay | Admitting: Radiology

## 2019-02-08 ENCOUNTER — Encounter: Payer: Self-pay | Admitting: Radiology

## 2019-02-11 ENCOUNTER — Ambulatory Visit: Payer: 59 | Admitting: Orthopedic Surgery

## 2019-02-12 ENCOUNTER — Ambulatory Visit: Payer: 59 | Admitting: Orthopedic Surgery

## 2019-02-14 ENCOUNTER — Ambulatory Visit: Payer: 59 | Admitting: Obstetrics & Gynecology

## 2019-02-15 ENCOUNTER — Other Ambulatory Visit: Payer: Self-pay

## 2019-02-15 ENCOUNTER — Encounter: Payer: Self-pay | Admitting: Orthopedic Surgery

## 2019-02-15 ENCOUNTER — Ambulatory Visit (INDEPENDENT_AMBULATORY_CARE_PROVIDER_SITE_OTHER): Payer: 59 | Admitting: Orthopedic Surgery

## 2019-02-15 ENCOUNTER — Ambulatory Visit: Payer: 59

## 2019-02-15 VITALS — BP 115/78 | HR 92 | Temp 97.5°F | Ht 65.0 in | Wt 355.0 lb

## 2019-02-15 DIAGNOSIS — M25561 Pain in right knee: Secondary | ICD-10-CM

## 2019-02-15 DIAGNOSIS — M5136 Other intervertebral disc degeneration, lumbar region: Secondary | ICD-10-CM

## 2019-02-15 DIAGNOSIS — G8929 Other chronic pain: Secondary | ICD-10-CM

## 2019-02-15 DIAGNOSIS — M25562 Pain in left knee: Secondary | ICD-10-CM

## 2019-02-15 NOTE — Patient Instructions (Signed)
Madison Mcgrath we have 3 issues here 1 is your weight which are working on  1 is your back is causing her legs to give out and the other problem is that your knees have arthritis  Take your diclofenac daily  Physical therapy  No surgery needed

## 2019-02-15 NOTE — Progress Notes (Addendum)
Madison Mcgrath  02/15/2019  Body mass index is 59.08 kg/m.   HISTORY SECTION :  Chief Complaint  Patient presents with  . Knee Pain    Bilateral knee pain.   29 year old female with hypothyroidism morbid obesity presents for evaluation of bilateral knee pain and complaints of her knees popping out.  She says this began when she was a child and she has had trouble with them ever since.  She does complain of increased pain with weightbearing activities and she has difficulty with the legs giving out from under her  She does take diclofenac on an occasional basis but does not take it every day and she believes that she has had therapy on her knee once a long time ago   Review of Systems  Cardiovascular: Positive for claudication.  Gastrointestinal: Positive for heartburn.  Musculoskeletal: Positive for joint pain and myalgias.  Endo/Heme/Allergies: Bruises/bleeds easily.  All other systems reviewed and are negative.    has a past medical history of Hypertension and Thyroid disease.   Past Surgical History:  Procedure Laterality Date  . CHOLECYSTECTOMY      Body mass index is 59.08 kg/m.   Allergies  Allergen Reactions  . Bee Venom      Current Outpatient Medications:  .  amLODipine (NORVASC) 5 MG tablet, TAKE 1 TABLET BY MOUTH EVERY DAY, Disp: , Rfl:  .  benzonatate (TESSALON PERLES) 100 MG capsule, Take 1 capsule (100 mg total) by mouth 3 (three) times daily as needed., Disp: 20 capsule, Rfl: 0 .  cyclobenzaprine (FLEXERIL) 10 MG tablet, Take 1 tablet (10 mg total) by mouth 3 (three) times daily., Disp: 15 tablet, Rfl: 0 .  diclofenac (VOLTAREN) 75 MG EC tablet, Take 1 tablet (75 mg total) by mouth 2 (two) times daily., Disp: 14 tablet, Rfl: 0 .  famotidine (PEPCID) 20 MG tablet, , Disp: , Rfl:  .  lamoTRIgine (LAMICTAL) 200 MG tablet, TK 1 T PO  DAILY, Disp: , Rfl: 2 .  levothyroxine (SYNTHROID, LEVOTHROID) 50 MCG tablet, Take by mouth., Disp: , Rfl:  .  losartan  (COZAAR) 50 MG tablet, Take 50 mg by mouth daily., Disp: , Rfl:  .  traZODone (DESYREL) 150 MG tablet, Take by mouth., Disp: , Rfl:  .  Vitamin D, Ergocalciferol, (DRISDOL) 1.25 MG (50000 UNIT) CAPS capsule, Take 50,000 Units by mouth 2 (two) times a week., Disp: , Rfl:  .  VRAYLAR capsule, , Disp: , Rfl:    PHYSICAL EXAM SECTION: 1) BP 115/78   Pulse 92   Temp (!) 97.5 F (36.4 C)   Ht 5\' 5"  (1.651 m)   Wt (!) 355 lb (161 kg)   LMP 02/02/2019   BMI 59.08 kg/m   Body mass index is 59.08 kg/m. General appearance: Well-developed morbid obesity no gross deformities  2) Cardiovascular normal pulse and perfusion , normal color   3) Neurologically deep tendon reflexes are equal and normal, no sensation loss or deficits no pathologic reflexes  4) Psychological: Awake alert and oriented x3 mood and affect normal  5) Skin no lacerations or ulcerations no nodularity no palpable masses, no erythema or nodularity  6) Musculoskeletal:   Procedure right knee left knee  Right knee first Tenderness in the lateral compartment neurovascularly intact skin normal flexion arc 120 degrees limited by the size of the posterior adipose tissue in the thigh no instability in any of the 4 ligaments muscle tone normal  Left knee tenderness medial and lateral compartment neurovascular  intact skin normal flexion arc similar with limitations caused by adipose tissue no instability in any of the ligaments muscle tone normal  Lumbar exam tenderness in the thoracic spine and lumbar spine, definitely more lumbar spine 10   MEDICAL DECISION MAKING  A.  Encounter Diagnoses  Name Primary?  . Chronic pain of both knees Yes  . DDD (degenerative disc disease), lumbar     B. DATA ANALYSED:  IMAGING: Independent interpretation of images: No outside films  X-rays of the right and left knee show valgus alignment with osteoarthritis mild joint osteophytes and narrowing of the lateral compartment   Orders:  Physical therapy  Outside records reviewed: Dr. Margo Aye made the referral he sent over her notes she does have hypothyroidism she had a cholecystectomy she has been on diclofenac and Fioricet.  He is working on her morbid obesity.  C. MANAGEMENT   I do not think surgery is necessary especially with her weight.  Recommend weight loss coordinate with primary care  Physical therapy lumbar spine and knees  Take her diclofenac daily  Follow-up as needed  Summary problem #1 osteoarthritis right knee, chronic with progression exacerbation Problem #2 osteoarthritis left knee chronic with exacerbation  Lower back pain problem #3 chronic with exacerbation  Outside records reviewed internal x-rays  Physical therapy No orders of the defined types were placed in this encounter.     Fuller Canada, MD  02/15/2019 11:21 AM

## 2019-02-26 ENCOUNTER — Encounter: Payer: Self-pay | Admitting: Obstetrics & Gynecology

## 2019-02-26 ENCOUNTER — Other Ambulatory Visit: Payer: Self-pay

## 2019-02-26 ENCOUNTER — Ambulatory Visit (INDEPENDENT_AMBULATORY_CARE_PROVIDER_SITE_OTHER): Payer: 59 | Admitting: Obstetrics & Gynecology

## 2019-02-26 VITALS — BP 143/91 | HR 94 | Ht 65.0 in | Wt 361.8 lb

## 2019-02-26 DIAGNOSIS — Z975 Presence of (intrauterine) contraceptive device: Secondary | ICD-10-CM | POA: Diagnosis not present

## 2019-02-26 DIAGNOSIS — N921 Excessive and frequent menstruation with irregular cycle: Secondary | ICD-10-CM | POA: Diagnosis not present

## 2019-02-26 MED ORDER — MEGESTROL ACETATE 40 MG PO TABS: ORAL_TABLET | ORAL | 3 refills | Status: DC

## 2019-02-26 NOTE — Progress Notes (Signed)
Chief Complaint  Patient presents with  . Menorrhagia    has a nexplanon      29 y.o. G0P0000 Patient's last menstrual period was 02/02/2019. The current method of family planning is nexplanon .  Outpatient Encounter Medications as of 02/26/2019  Medication Sig  . benzonatate (TESSALON PERLES) 100 MG capsule Take 1 capsule (100 mg total) by mouth 3 (three) times daily as needed.  . cyclobenzaprine (FLEXERIL) 10 MG tablet Take 1 tablet (10 mg total) by mouth 3 (three) times daily.  . diclofenac (VOLTAREN) 75 MG EC tablet Take 1 tablet (75 mg total) by mouth 2 (two) times daily.  Marland Kitchen etonogestrel (NEXPLANON) 68 MG IMPL implant 1 each by Subdermal route once.  . famotidine (PEPCID) 20 MG tablet   . lamoTRIgine (LAMICTAL) 200 MG tablet TK 1 T PO  DAILY  . levothyroxine (SYNTHROID, LEVOTHROID) 50 MCG tablet Take by mouth.  . losartan (COZAAR) 50 MG tablet Take 50 mg by mouth daily.  . traZODone (DESYREL) 150 MG tablet Take by mouth.  . Vitamin D, Ergocalciferol, (DRISDOL) 1.25 MG (50000 UNIT) CAPS capsule Take 50,000 Units by mouth 2 (two) times a week.  Marland Kitchen VRAYLAR capsule 3 mg.   . megestrol (MEGACE) 40 MG tablet 3 tablets a day for 5 days, 2 tablets a day for 5 days then 1 tablet daily  . [DISCONTINUED] amLODipine (NORVASC) 5 MG tablet TAKE 1 TABLET BY MOUTH EVERY DAY   No facility-administered encounter medications on file as of 02/26/2019.    Subjective Pt has been on nexplanon for almost 2 years and has minimal bleeding She went 6 months without any bleeding then bled super heavy for 8 days Soiled clothes sheets upholstory in car No cramping at all Has never done this nexplanon due to come out 4/22 Normal Pap etc Past Medical History:  Diagnosis Date  . Hypertension   . Thyroid disease     Past Surgical History:  Procedure Laterality Date  . CHOLECYSTECTOMY      OB History    Gravida  0   Para  0   Term  0   Preterm  0   AB  0   Living  0     SAB  0    TAB  0   Ectopic  0   Multiple  0   Live Births  0           Allergies  Allergen Reactions  . Bee Venom     Social History   Socioeconomic History  . Marital status: Single    Spouse name: Not on file  . Number of children: Not on file  . Years of education: Not on file  . Highest education level: Not on file  Occupational History  . Not on file  Tobacco Use  . Smoking status: Never Smoker  . Smokeless tobacco: Never Used  Substance and Sexual Activity  . Alcohol use: Never  . Drug use: Never  . Sexual activity: Yes    Birth control/protection: Implant  Other Topics Concern  . Not on file  Social History Narrative  . Not on file   Social Determinants of Health   Financial Resource Strain:   . Difficulty of Paying Living Expenses: Not on file  Food Insecurity:   . Worried About Charity fundraiser in the Last Year: Not on file  . Ran Out of Food in the Last Year: Not on file  Transportation Needs:   .  Lack of Transportation (Medical): Not on file  . Lack of Transportation (Non-Medical): Not on file  Physical Activity:   . Days of Exercise per Week: Not on file  . Minutes of Exercise per Session: Not on file  Stress:   . Feeling of Stress : Not on file  Social Connections:   . Frequency of Communication with Friends and Family: Not on file  . Frequency of Social Gatherings with Friends and Family: Not on file  . Attends Religious Services: Not on file  . Active Member of Clubs or Organizations: Not on file  . Attends Banker Meetings: Not on file  . Marital Status: Not on file    Family History  Problem Relation Age of Onset  . High blood pressure Mother   . Osteoporosis Mother   . Cancer Maternal Grandmother   . Diabetes Maternal Grandfather     Medications:       Current Outpatient Medications:  .  benzonatate (TESSALON PERLES) 100 MG capsule, Take 1 capsule (100 mg total) by mouth 3 (three) times daily as needed., Disp: 20  capsule, Rfl: 0 .  cyclobenzaprine (FLEXERIL) 10 MG tablet, Take 1 tablet (10 mg total) by mouth 3 (three) times daily., Disp: 15 tablet, Rfl: 0 .  diclofenac (VOLTAREN) 75 MG EC tablet, Take 1 tablet (75 mg total) by mouth 2 (two) times daily., Disp: 14 tablet, Rfl: 0 .  etonogestrel (NEXPLANON) 68 MG IMPL implant, 1 each by Subdermal route once., Disp: , Rfl:  .  famotidine (PEPCID) 20 MG tablet, , Disp: , Rfl:  .  lamoTRIgine (LAMICTAL) 200 MG tablet, TK 1 T PO  DAILY, Disp: , Rfl: 2 .  levothyroxine (SYNTHROID, LEVOTHROID) 50 MCG tablet, Take by mouth., Disp: , Rfl:  .  losartan (COZAAR) 50 MG tablet, Take 50 mg by mouth daily., Disp: , Rfl:  .  traZODone (DESYREL) 150 MG tablet, Take by mouth., Disp: , Rfl:  .  Vitamin D, Ergocalciferol, (DRISDOL) 1.25 MG (50000 UNIT) CAPS capsule, Take 50,000 Units by mouth 2 (two) times a week., Disp: , Rfl:  .  VRAYLAR capsule, 3 mg. , Disp: , Rfl:  .  megestrol (MEGACE) 40 MG tablet, 3 tablets a day for 5 days, 2 tablets a day for 5 days then 1 tablet daily, Disp: 45 tablet, Rfl: 3  Objective Blood pressure (!) 143/91, pulse 94, height 5\' 5"  (1.651 m), weight (!) 361 lb 12.8 oz (164.1 kg), last menstrual period 02/02/2019.  Gen obese female NAD  Pertinent ROS No burning with urination, frequency or urgency No nausea, vomiting or diarrhea Nor fever chills or other constitutional symptoms   Labs or studies Reviewed her labs from ED visit    Impression Diagnoses this Encounter::   ICD-10-CM   1. Menorrhagia bleeding episode on Nexplanon  N92.1    Z97.5     Established relevant diagnosis(es):   Plan/Recommendations: Meds ordered this encounter  Medications  . megestrol (MEGACE) 40 MG tablet    Sig: 3 tablets a day for 5 days, 2 tablets a day for 5 days then 1 tablet daily    Dispense:  45 tablet    Refill:  3    Labs or Scans Ordered: No orders of the defined types were placed in this encounter.   Management:: Megestrol  algorithm if needed if happens again  Follow up No follow-ups on file.        Face to face time:  20 minutes  Greater  than 50% of the visit time was spent in counseling and coordination of care with the patient.  The summary and outline of the counseling and care coordination is summarized in the note above.   All questions were answered.

## 2019-03-01 ENCOUNTER — Ambulatory Visit (HOSPITAL_COMMUNITY): Payer: 59

## 2019-03-05 ENCOUNTER — Ambulatory Visit (HOSPITAL_COMMUNITY): Payer: 59 | Admitting: Physical Therapy

## 2019-03-05 ENCOUNTER — Telehealth (HOSPITAL_COMMUNITY): Payer: Self-pay | Admitting: Physical Therapy

## 2019-03-05 NOTE — Telephone Encounter (Signed)
pt stated that she has to cancel this appt due to her mom is her only transportation and she is not feeling well. Per pt she will call back to r/s.

## 2019-03-27 ENCOUNTER — Ambulatory Visit: Payer: 59 | Admitting: Orthopedic Surgery

## 2019-03-27 ENCOUNTER — Encounter: Payer: Self-pay | Admitting: Orthopedic Surgery

## 2019-08-02 DIAGNOSIS — I10 Essential (primary) hypertension: Secondary | ICD-10-CM | POA: Insufficient documentation

## 2019-08-02 DIAGNOSIS — O10919 Unspecified pre-existing hypertension complicating pregnancy, unspecified trimester: Secondary | ICD-10-CM | POA: Insufficient documentation

## 2020-01-01 ENCOUNTER — Ambulatory Visit: Payer: 59 | Admitting: Adult Health

## 2020-01-25 NOTE — L&D Delivery Note (Addendum)
LABOR COURSE Madison Mcgrath is a 30 y.o. G1P0000 at [redacted]w[redacted]d who presented on 11/12 for IOL due to [facility] and A2/B GDM. She was GBS positive and received adequate PCN prophylaxis. She received cytotec x3 and a foley balloon was placed. Pitocin was started and AROM was performed. IUPC and FSE were placed for more accurate monitoring.   Delivery Note Patient was complete and pushing. Head delivered spontaneously. No nuchal cord present. Shoulder and body delivered in usual fashion, body was in transverse position in her pelvis. At 1100 a viable and healthy female was delivered via Vaginal, Spontaneous (Presentation: vertex; direct OA ). Infant without spontaneous cry, so code APGAR was called, cord was clamped immediately, and baby was brought over to the warmer for resuscitation. Cord blood drawn and sample for cord gas was obtained. Placenta delivered spontaneously with gentle cord traction. Appears intact. Fundus firm with massage and Pitocin. Prophylactic TXA was given due to high risk for PPH. Labia, perineum, vagina, and cervix inspected with bilateral labial tears, one of which was not hemostatic and required repair. Meanwhile, baby was successfully resuscitated and returned to mother's abdomen for skin-to-skin.     APGAR: 4, 9; weight pending.   Cord: 3VC with the following complications: n/a.   Cord pH: Insufficient sample, unable to assess  Anesthesia: Epidural and lidocaine Episiotomy: None Lacerations: bilateral labial, hemostatic on the L but non-hemostatic on the R and required repair Suture Repair: 3.0 vicryl Est. Blood Loss (mL): 200  Mom to postpartum.  Baby to Couplet care / Skin to Skin.  Reeves Forth, MD 12/07/20 12:01 PM    ATTESTATION  I was present, gloved, and supervising throughout the delivery and agree with above documentation in the resident's note. Laceration repair started with myself supervising, however completed with Dr. Das/Faber.   Allayne Stack, DO OB Fellow   [facility] [employer] [facility] (Faculty Practice) 12/07/2020, 1:12 PM

## 2020-02-27 ENCOUNTER — Ambulatory Visit: Payer: Medicare PPO | Admitting: Advanced Practice Midwife

## 2020-03-16 ENCOUNTER — Ambulatory Visit: Payer: 59 | Admitting: Advanced Practice Midwife

## 2020-03-26 ENCOUNTER — Ambulatory Visit: Payer: 59 | Admitting: Advanced Practice Midwife

## 2020-04-02 ENCOUNTER — Ambulatory Visit: Payer: 59 | Admitting: Obstetrics & Gynecology

## 2020-04-06 ENCOUNTER — Emergency Department (HOSPITAL_COMMUNITY)
Admission: EM | Admit: 2020-04-06 | Discharge: 2020-04-06 | Disposition: A | Discharge status: Home / Self Care | Payer: 59 | Attending: Emergency Medicine | Admitting: Emergency Medicine

## 2020-04-06 ENCOUNTER — Emergency Department (HOSPITAL_COMMUNITY): Payer: 59

## 2020-04-06 ENCOUNTER — Telehealth: Payer: Self-pay

## 2020-04-06 ENCOUNTER — Other Ambulatory Visit: Payer: Self-pay

## 2020-04-06 ENCOUNTER — Encounter (HOSPITAL_COMMUNITY): Payer: Self-pay | Admitting: *Deleted

## 2020-04-06 ENCOUNTER — Telehealth: Payer: Self-pay | Admitting: Adult Health

## 2020-04-06 DIAGNOSIS — Z3A00 Weeks of gestation of pregnancy not specified: Secondary | ICD-10-CM | POA: Diagnosis not present

## 2020-04-06 DIAGNOSIS — E039 Hypothyroidism, unspecified: Secondary | ICD-10-CM | POA: Insufficient documentation

## 2020-04-06 DIAGNOSIS — Z79899 Other long term (current) drug therapy: Secondary | ICD-10-CM | POA: Insufficient documentation

## 2020-04-06 DIAGNOSIS — O26899 Other specified pregnancy related conditions, unspecified trimester: Secondary | ICD-10-CM | POA: Diagnosis not present

## 2020-04-06 DIAGNOSIS — O26859 Spotting complicating pregnancy, unspecified trimester: Secondary | ICD-10-CM | POA: Diagnosis not present

## 2020-04-06 DIAGNOSIS — Z3201 Encounter for pregnancy test, result positive: Secondary | ICD-10-CM

## 2020-04-06 DIAGNOSIS — O4690 Antepartum hemorrhage, unspecified, unspecified trimester: Secondary | ICD-10-CM

## 2020-04-06 LAB — URINALYSIS, ROUTINE W REFLEX MICROSCOPIC
Bilirubin Urine: NEGATIVE
Glucose, UA: NEGATIVE mg/dL
Ketones, ur: NEGATIVE mg/dL
Nitrite: NEGATIVE
Protein, ur: 30 mg/dL — AB
Specific Gravity, Urine: 1.02 (ref 1.005–1.030)
WBC, UA: >50 WBC/hpf — ABNORMAL HIGH (ref 0–5)
pH: 6 (ref 5.0–8.0)

## 2020-04-06 LAB — CBC
HCT: 49.2 % — ABNORMAL HIGH (ref 36.0–46.0)
Hemoglobin: 15.3 g/dL — ABNORMAL HIGH (ref 12.0–15.0)
MCH: 26.3 pg (ref 26.0–34.0)
MCHC: 31.1 g/dL (ref 30.0–36.0)
MCV: 84.7 fL (ref 80.0–100.0)
Platelets: 280 10*3/uL (ref 150–400)
RBC: 5.81 MIL/uL — ABNORMAL HIGH (ref 3.87–5.11)
RDW: 14.2 % (ref 11.5–15.5)
WBC: 11.2 10*3/uL — ABNORMAL HIGH (ref 4.0–10.5)
nRBC: 0 % (ref 0.0–0.2)

## 2020-04-06 LAB — ABO/RH: ABO/RH(D): O POS

## 2020-04-06 LAB — HCG, QUANTITATIVE, PREGNANCY: hCG, Beta Chain, Quant, S: 282 m[IU]/mL — ABNORMAL HIGH (ref <5)

## 2020-04-06 LAB — POC URINE PREG, ED: Preg Test, Ur: POSITIVE — AB

## 2020-04-06 MED ORDER — CEFPODOXIME PROXETIL 100 MG PO TABS: 100.0000 mg | ORAL_TABLET | Freq: Two times a day (BID) | ORAL | 0 refills | Status: AC

## 2020-04-06 NOTE — Discharge Instructions (Addendum)
Please call [facility] to schedule appointment for reevaluation in the next few days for repeat quantitative hCG and continued evaluation and management.  Your quantitative hCG is consistent with [redacted] weeks gestation.  There was no intrauterine pregnancy visualized on ultrasound because it is simply too early.  They did not see any findings concerning for ectopic pregnancy at this time.  Your urine was concerning for possible UTI.  Please take the prescribed Cefpodoxime twice daily for the next 5 days.  Return to the ED or seek immediate medical attention should you experience any fevers, severe abdominal pain, worsening vaginal bleeding, or any other new or worsening symptoms.

## 2020-04-06 NOTE — Telephone Encounter (Signed)
Patient called stating that she took a home preg test and it was positive and then she went to the bathroom she was lightly bleeding, but later that day she passed clot and it hurt and now she is not bleeding anymore. Pt would like to know what to do she is confused. Please contact pt

## 2020-04-06 NOTE — ED Provider Notes (Signed)
[facility] EMERGENCY DEPARTMENT Provider Note   CSN: [id number] Arrival date & time: 04/06/20  1017     History Chief Complaint  Patient presents with  . Vaginal Bleeding    Madison Mcgrath is a 30 y.o. female with no relevant past medical history presents the ED with a 1 day history of light vaginal bleeding and light lower abdominal cramping.  She states that she is in a committed relationship with her boyfriend and that her last menses is unknown, but she thinks maybe last month.  She states that she had taken a urine pregnancy last week which was negative, but she took one yesterday that was positive.  She has never been pregnant before and when she noticed her light vaginal bleeding and cramping this morning, she called [facility] and left a message, but has not yet heard back.  She states that she had tried to schedule appointment with them in the past, but would miss them due to transportation issues.  She does have a primary care provider locally, Nita Sells.    She states this morning when she goes to the bathroom she notices a pinkish discoloration to her urine/discharge.  She has also experienced a few episodes of bright red vaginal bleeding.  The bleeding is light, less than her typical menses.  No passage of clots.  She does endorse some lower abdominal cramping, but no severe pain symptoms.  She denies any nausea or emesis.  States that her periods are regular.  No fevers, chills, chest pain or shortness of breath, unilateral extremity swelling or edema, or any other symptoms.  She is in a committed relationship and denies any dyspareunia.  Denies concern for STI.  HPI     Past Medical History:  Diagnosis Date  . Hypertension   . Thyroid disease     Patient Active Problem List   Diagnosis Date Noted  . Sexual dysfunction, psychological 11/22/2017  . Other specified hypothyroidism 05/01/2017  . Morbid obesity with BMI of 50.0-59.9, adult (HCC) 11/02/2015  . Plantar  fasciitis 11/02/2015  . Skin lesion of back 11/02/2015  . Attention deficit hyperactivity disorder (ADHD) 04/05/2010  . Bipolar I disorder, single manic episode (HCC) 04/05/2010    Past Surgical History:  Procedure Laterality Date  . CHOLECYSTECTOMY       OB History    Gravida  1   Para  0   Term  0   Preterm  0   AB  0   Living  0     SAB  0   IAB  0   Ectopic  0   Multiple  0   Live Births  0           Family History  Problem Relation Age of Onset  . High blood pressure Mother   . Osteoporosis Mother   . Cancer Maternal Grandmother   . Diabetes Maternal Grandfather     Social History   Tobacco Use  . Smoking status: Never Smoker  . Smokeless tobacco: Never Used  Vaping Use  . Vaping Use: Never used  Substance Use Topics  . Alcohol use: Never  . Drug use: Never    Home Medications Prior to Admission medications   Medication Sig Start Date End Date Taking? Authorizing Provider  cefpodoxime (VANTIN) 100 MG tablet Take 1 tablet (100 mg total) by mouth 2 (two) times daily for 5 days. 04/06/20 04/11/20 Yes Green, Sharion Settler, PA-C  benzonatate (TESSALON PERLES) 100  MG capsule Take 1 capsule (100 mg total) by mouth 3 (three) times daily as needed. Patient not taking: Reported on 04/06/2020 11/30/18   Bennie PieriniMartin, Mary-Margaret, FNP  cyclobenzaprine (FLEXERIL) 10 MG tablet Take 1 tablet (10 mg total) by mouth 3 (three) times daily. 08/14/18   Burgess AmorIdol, Julie, PA-C  diclofenac (VOLTAREN) 75 MG EC tablet Take 1 tablet (75 mg total) by mouth 2 (two) times daily. Patient not taking: Reported on 04/06/2020 08/14/18   Burgess AmorIdol, Julie, PA-C  etonogestrel (NEXPLANON) 68 MG IMPL implant 1 each by Subdermal route once.    [provider]  famotidine (PEPCID) 20 MG tablet  08/16/18   [provider]  lamoTRIgine (LAMICTAL) 200 MG tablet TK 1 T PO  DAILY 11/10/17   [provider]  levothyroxine (SYNTHROID, LEVOTHROID) 50 MCG tablet Take by mouth. 03/09/17    [provider]  losartan (COZAAR) 50 MG tablet Take 50 mg by mouth daily. 12/28/18   [provider]  megestrol (MEGACE) 40 MG tablet 3 tablets a day for 5 days, 2 tablets a day for 5 days then 1 tablet daily 02/26/19   Lazaro ArmsEure, Luther H, MD  traZODone (DESYREL) 150 MG tablet Take by mouth.    [provider]  Vitamin D, Ergocalciferol, (DRISDOL) 1.25 MG (50000 UNIT) CAPS capsule Take 50,000 Units by mouth 2 (two) times a week. 11/08/18   [provider]  VRAYLAR capsule 3 mg.  08/08/18   [provider]    Allergies    Bee venom  Review of Systems   Review of Systems  All other systems reviewed and are negative.   Physical Exam Updated Vital Signs BP (!) 159/113 (BP Location: Right Arm)   Pulse 87   Temp 98.7 F (37.1 C) (Oral)   Resp 16   Ht 5\' 5"  (1.651 m)   Wt (!) 165.1 kg   LMP  (LMP Unknown)   SpO2 99%   BMI 60.57 kg/m   Physical Exam Vitals and nursing note reviewed. Exam conducted with a chaperone present.  Constitutional:      General: She is not in acute distress.    Appearance: She is not toxic-appearing.  HENT:     Head: Normocephalic and atraumatic.  Eyes:     General: No scleral icterus.    Conjunctiva/sclera: Conjunctivae normal.  Cardiovascular:     Rate and Rhythm: Normal rate.     Pulses: Normal pulses.  Pulmonary:     Effort: Pulmonary effort is normal.  Abdominal:     General: Abdomen is flat. There is no distension.     Palpations: Abdomen is soft.     Tenderness: There is no guarding.     Comments: Protuberant abdomen.  Soft, nondistended.  No peritoneal signs.  Very mild tenderness appreciated over suprapubic region.  No guarding.  No TTP elsewhere.  No overlying skin changes.  Skin:    General: Skin is dry.  Neurological:     Mental Status: She is alert and oriented to person, place, and time.     GCS: GCS eye subscore is 4. GCS verbal subscore is 5. GCS motor subscore is 6.  Psychiatric:         Mood and Affect: Mood normal.        Behavior: Behavior normal.        Thought Content: Thought content normal.     ED Results / Procedures / Treatments   Labs (all labs ordered are listed, but only abnormal results  are displayed) Labs Reviewed  CBC - Abnormal; Notable for the following components:      Result Value   WBC 11.2 (*)    RBC 5.81 (*)    Hemoglobin 15.3 (*)    HCT 49.2 (*)    All other components within normal limits  HCG, QUANTITATIVE, PREGNANCY - Abnormal; Notable for the following components:   hCG, Beta Chain, Quant, S 282 (*)    All other components within normal limits  URINALYSIS, ROUTINE W REFLEX MICROSCOPIC - Abnormal; Notable for the following components:   Color, Urine AMBER (*)    APPearance CLOUDY (*)    Hgb urine dipstick LARGE (*)    Protein, ur 30 (*)    Leukocytes,Ua LARGE (*)    WBC, UA >50 (*)    Bacteria, UA FEW (*)    All other components within normal limits  POC URINE PREG, ED - Abnormal; Notable for the following components:   Preg Test, Ur POSITIVE (*)    All other components within normal limits  URINE CULTURE  ABO/RH    EKG None  Radiology US OB Comp < 14 Wks  Result Date: 04/06/2020 CLINICAL DATA:  Cramping and vaginal bleeding. Positive pregnancy test. EXAM: OBSTETRIC <14 WK [place] AND TRANSVAGINAL OB US TECHNIQUE: Both transabdominal and transvaginal ultrasound examinations were performed for complete evaluation of the gestation as well as the maternal uterus, adnexal regions, and pelvic cul-de-sac. Transvaginal technique was performed to assess early pregnancy. COMPARISON:  None. FINDINGS: Intrauterine gestational sac: None Yolk sac:  N/A Embryo:  N/A Cardiac Activity: N/A Heart Rate: N/A bpm Subchorionic hemorrhage:  N/A Maternal uterus/adnexae: The endometrium measures a maximum of 12 mm. Small nabothian cysts are noted at the cervix. The ovaries are normal. No adnexal mass. No free pelvic fluid collections. IMPRESSION: 1. No  intrauterine gestational sac is identified. 2. The ovaries appear normal.  No adnexal mass. Electronically Signed   By: Rudie Meyer M.D.   On: 04/06/2020 14:05    Procedures Procedures   Medications Ordered in ED Medications - No data to display  ED Course  I have reviewed the triage vital signs and the nursing notes.  Pertinent labs & imaging results that were available during my care of the patient were reviewed by me and considered in my medical decision making (see chart for details).    MDM Rules/Calculators/A&P                          Madison Mcgrath was evaluated in Emergency Department on 04/06/2020 for the symptoms described in the history of present illness. She was evaluated in the context of the global COVID-19 pandemic, which necessitated consideration that the patient might be at risk for infection with the SARS-CoV-2 virus that causes COVID-19. Institutional protocols and algorithms that pertain to the evaluation of patients at risk for COVID-19 are in a state of rapid change based on information released by regulatory bodies including the CDC and federal and state organizations. These policies and algorithms were followed during the patient's care in the ED.  I personally reviewed patient's medical chart and all notes from triage and staff during today's encounter. I have also ordered and reviewed all labs and imaging that I felt to be medically necessary in the evaluation of this patient's complaints and with consideration of their physical exam. If needed, translation services were available and utilized.   Labs Urine pregnancy: POSITIVE CBC: Mild leukocytosis to 11.2,  but consistent with baseline.  Suspect that she is also mildly hemoconcentrated. UA: Evidence of infection with >50 WBC. While nitrite negative and only few bacteria, including squamous epithelium, will treat. Urine culture added on.  Beta-hCG quantitative: 282.  Suspect 4w gestation. ABO/Rh: O+.  Rhogam not  warranted even if it is not her first pregnancy.  Imaging US OB transvaginal < 14 weeks: The ovaries appear normal.  No adnexal masses.  No visualized intrauterine gestational sac.  Patient reports scant bleeding, however her bleeding is not significant or heavier than her typical menses.  No passage of large clots or tissue.  Patient also notes that she is sexually active as recently as 48 hours ago which may have precipitated her mild bleeding.  She also endorses mild suprapubic cramping discomfort, however no significant pain symptoms.  My physical exam was reassuring.  No significant abdominal tenderness or peritoneal signs.  Patient declined pelvic exam which I feel is okay given that she denies any significant discharge or concern for STI.  I also have lower suspicion for ectopic given reassuring ultrasound.  No adnexal or ovarian inflammatory changes.  She denies any passage of large tissue or clots and only describes a "spotting".  I feel as though outpatient follow-up with her OB/GYN in the next few days for repeat quantitative hCG to ensure upward trend is appropriate.  [facility] has finally reached back out to her and she was simply waiting on positive pregnancy to confirm appointment with them.  Will prescribe cefpodoxime 100 mg BID x 5 days to cover her for UTI.    ED return precautions discussed.  Patient voices understanding and is agreeable to plan.     Final Clinical Impression(s) / ED Diagnoses Final diagnoses:  Vaginal bleeding in pregnancy    Rx / DC Orders ED Discharge Orders         Ordered    cefpodoxime (VANTIN) 100 MG tablet  2 times daily        04/06/20 1508           Lorelee New, PA-C 04/06/20 1508    Vanetta Mulders, MD 04/14/20 [phone]

## 2020-04-06 NOTE — Telephone Encounter (Signed)
Left message that I put in order for [facility], not sure if miscarriage or not

## 2020-04-06 NOTE — ED Triage Notes (Signed)
Pt with vaginal bleeding starting this morning, had a recent positive pregnancy test.  Pt states she had a large blood clot pass this morning and with bright red bleeding only with wiping.

## 2020-04-06 NOTE — Telephone Encounter (Signed)
Pt called stating that she took a home test last week and it was negative and then took one on Sunday and it was positive, she went to the bathroom and had som bright red blood and had a blood clot in the toilet the size of a quarter. She stated she is concerned because she has never had a positive home test before, she also stated that there is blood each time she wipes but does not have a heavy flow.

## 2020-04-06 NOTE — Addendum Note (Signed)
Addended by: Cyril Mourning A on: 04/06/2020 01:48 PM   Modules accepted: Orders

## 2020-04-06 NOTE — Telephone Encounter (Signed)
Pt went to ED with vaginal bleeding and pregnancy was confirmed and she was told to f/u with our office on HCG levels , pt has an appointment on 04/15/20 with Dr. Charlotta Newton

## 2020-04-08 LAB — URINE CULTURE

## 2020-04-09 ENCOUNTER — Telehealth: Payer: Self-pay | Admitting: Obstetrics & Gynecology

## 2020-04-09 ENCOUNTER — Telehealth: Payer: Self-pay | Admitting: Internal Medicine

## 2020-04-09 DIAGNOSIS — Z3201 Encounter for pregnancy test, result positive: Secondary | ICD-10-CM

## 2020-04-09 LAB — BETA HCG QUANT (REF LAB): hCG Quant: 693 m[IU]/mL

## 2020-04-09 NOTE — Telephone Encounter (Signed)
PT would like to know if the results of her lab work is back and what the results are.

## 2020-04-09 NOTE — Telephone Encounter (Signed)
Pt aware that [facility] is rising, will recheck in am

## 2020-04-09 NOTE — Telephone Encounter (Signed)
Corrected:PT would like to know the results to her lab work.

## 2020-04-09 NOTE — Telephone Encounter (Signed)
Encounter under Dr. Nita Sells. Closing this encounter. JSY

## 2020-04-11 LAB — BETA HCG QUANT (REF LAB): hCG Quant: 1484 m[IU]/mL

## 2020-04-15 ENCOUNTER — Ambulatory Visit: Payer: 59 | Admitting: Obstetrics & Gynecology

## 2020-04-15 ENCOUNTER — Encounter: Payer: Self-pay | Admitting: Obstetrics & Gynecology

## 2020-04-15 ENCOUNTER — Other Ambulatory Visit: Payer: Self-pay

## 2020-04-15 VITALS — Ht 65.0 in | Wt 360.6 lb

## 2020-04-15 DIAGNOSIS — Z3201 Encounter for pregnancy test, result positive: Secondary | ICD-10-CM

## 2020-04-15 DIAGNOSIS — N926 Irregular menstruation, unspecified: Secondary | ICD-10-CM

## 2020-04-15 LAB — POCT URINE PREGNANCY: Preg Test, Ur: POSITIVE — AB

## 2020-04-15 NOTE — Progress Notes (Signed)
   ER Follow up Visit Patient name: Madison Mcgrath MRN [id number]  Date of birth: 08-31-1990 Chief Complaint:   Possible Pregnancy (Has had 1+ UPT @ home)  History of Present Illness:   Madison Mcgrath is a 30 y.o. G1P0000 at unknown gestation being seen today for establishing care for new OB.  Initially, she desired to discuss contraceptive options, but recently found out she was pregnant.  hcG levels reviewed- appropriate rise in pregnancy Based on questionable LMP of around first week of Jan- possible ~ 11wks     Reports no acute complaints today.  Initially had some implantation bleeding, which has now resolved.  Denies nausea/vomiting.    No LMP recorded (lmp unknown). Patient is pregnant.  No flowsheet data found.   Review of Systems:   Pertinent items are noted in HPI Denies fever/chills, dizziness, headaches, visual disturbances, fatigue, shortness of breath, chest pain, abdominal pain, vomiting, bowel movements, urination, or intercourse unless otherwise stated above.  Pertinent History Reviewed:  Reviewed past medical,surgical, social, obstetrical and family history.  Reviewed problem list, medications and allergies. Physical Assessment:   Vitals:   04/15/20 1006  Weight: (!) 360 lb 9.6 oz (163.6 kg)  Height: 5\' 5"  (1.651 m)  Body mass index is 60.01 kg/m.       Physical Examination:   General appearance: alert, well appearing, and in no distress  Psych: mood appropriate, normal affect  Skin: warm & dry   Cardiovascular: normal heart rate noted  Respiratory: normal respiratory effort, no distress  Abdomen: obese  Pelvic: deferred  Extremities: no edema   Chaperone: N/A    Assessment & Plan:  1) Pregnancy -reviewed plan for initial OB visit with RN to review lab work, visits and overall expectations -next visit for OB due to unknown gestational age -discussed [place] -Reviewed miscarriage precautions and encouraged pt to call with any acute  concerns  2) Obesity- BMI 60 -discussed weight gain in pregnancy  Orders Placed This Encounter  Procedures  . POCT urine pregnancy    Return in about 1 week (around 04/22/2020) for Next available Initial OB RN visit and 04/24/2020 for dating.   [place], DO Attending Obstetrician & Gynecologist, [facility] for [facility]

## 2020-04-28 ENCOUNTER — Other Ambulatory Visit: Payer: Self-pay | Admitting: Obstetrics & Gynecology

## 2020-04-28 DIAGNOSIS — O3680X0 Pregnancy with inconclusive fetal viability, not applicable or unspecified: Secondary | ICD-10-CM

## 2020-04-29 ENCOUNTER — Ambulatory Visit (INDEPENDENT_AMBULATORY_CARE_PROVIDER_SITE_OTHER): Payer: 59

## 2020-04-29 ENCOUNTER — Other Ambulatory Visit: Payer: Self-pay

## 2020-04-29 DIAGNOSIS — O3680X0 Pregnancy with inconclusive fetal viability, not applicable or unspecified: Secondary | ICD-10-CM

## 2020-04-30 ENCOUNTER — Telehealth: Payer: Self-pay | Admitting: Women's Health

## 2020-04-30 ENCOUNTER — Other Ambulatory Visit: Payer: 59

## 2020-04-30 MED ORDER — LABETALOL HCL 100 MG PO TABS: 100.0000 mg | ORAL_TABLET | Freq: Two times a day (BID) | ORAL | 3 refills | Status: DC

## 2020-04-30 NOTE — Telephone Encounter (Signed)
Patient wants to speak with a nurse to discuss which medications are safe to take during her pregnancy. (Thyroid & Blood pressure pills). Clinical staff will follow up with patient.

## 2020-04-30 NOTE — Telephone Encounter (Signed)
Stop losartan and will rx labetalol 100 mg bid, rx sent to [facility]

## 2020-04-30 NOTE — Telephone Encounter (Signed)
Pt is 7 weeks 5 days pregnant. Pt is on Levothyroxine 50 mcg and Losartan 50 mg. I spoke with JAG. Thyroid med is safe to continue but BP med needs to be changed. Pt voiced understanding. JSY

## 2020-05-01 ENCOUNTER — Telehealth: Payer: Self-pay | Admitting: Advanced Practice Midwife

## 2020-05-01 NOTE — Telephone Encounter (Signed)
Pt noticed some spotting last night and this am. No pain. No more spotting. Pt did have a BM last night and this am. I advised that can be normal after sex or a BM. Can also be implantation spotting. Pt was advised to not have sex for 7 days from the last time she wipes any color. Pt is constipated and was advised can eat 3 prunes daily or drink prune juice. Also, drink lots of fluids to keep self well hydrated. Pt voiced understanding. JSY

## 2020-05-01 NOTE — Telephone Encounter (Signed)
Pt called, concerned that she had some pink blood while wiping last night & early this morning but none now & no pain Is this normal?  Pt is [redacted]w[redacted]d pregnant  Please advise & notify pt

## 2020-05-04 ENCOUNTER — Other Ambulatory Visit (INDEPENDENT_AMBULATORY_CARE_PROVIDER_SITE_OTHER): Payer: 59 | Admitting: *Deleted

## 2020-05-04 ENCOUNTER — Other Ambulatory Visit: Payer: Self-pay

## 2020-05-04 ENCOUNTER — Encounter: Payer: Self-pay | Admitting: *Deleted

## 2020-05-04 VITALS — Ht 65.0 in | Wt 363.6 lb

## 2020-05-04 DIAGNOSIS — R300 Dysuria: Secondary | ICD-10-CM

## 2020-05-04 LAB — POCT URINALYSIS DIPSTICK OB
Blood, UA: NEGATIVE
Glucose, UA: NEGATIVE
Ketones, UA: NEGATIVE
Nitrite, UA: NEGATIVE
POC,PROTEIN,UA: NEGATIVE

## 2020-05-04 MED ORDER — CEPHALEXIN 500 MG PO CAPS: 500.0000 mg | ORAL_CAPSULE | Freq: Four times a day (QID) | ORAL | 0 refills | Status: DC

## 2020-05-04 NOTE — Addendum Note (Signed)
Addended by: Shawna Clamp R on: 05/04/2020 12:43 PM   Modules accepted: Orders

## 2020-05-04 NOTE — Addendum Note (Signed)
Addended by: Colen Darling on: 05/04/2020 12:19 PM   Modules accepted: Orders

## 2020-05-04 NOTE — Progress Notes (Addendum)
   NURSE VISIT- UTI SYMPTOMS   SUBJECTIVE:  Madison Mcgrath is a 30 y.o. G49P0000 female here for UTI symptoms. She is [redacted]w[redacted]d pregnant. She reports burning with urination.  OBJECTIVE:  Ht 5\' 5"  (1.651 m)   Wt (!) 363 lb 9.6 oz (164.9 kg)   LMP  (LMP Unknown)   BMI 60.51 kg/m   Appears well, in no apparent distress  Results for orders placed or performed in visit on 05/04/20 (from the past 24 hour(s))  POC Urinalysis Dipstick OB   Collection Time: 05/04/20 12:06 PM  Result Value Ref Range   Color, UA     Clarity, UA     Glucose, UA Negative Negative   Bilirubin, UA     Ketones, UA neg    Spec Grav, UA     Blood, UA neg    pH, UA     POC,PROTEIN,UA Negative Negative, Trace, Small (1+), Moderate (2+), Large (3+), 4+   Urobilinogen, UA     Nitrite, UA neg    Leukocytes, UA Trace (A) Negative   Appearance     Odor      ASSESSMENT: Pregnancy [redacted]w[redacted]d with UTI symptoms and negative nitrites  PLAN: Discussed with [redacted]w[redacted]d, CNM, [place]   Rx sent by provider today: Yes Urine culture sent Call or return to clinic prn if these symptoms worsen or fail to improve as anticipated. Follow-up: as scheduled   [facility]  05/04/2020 12:12 PM   Chart reviewed for nurse visit. Agree with plan of care. Rx keflex, send cx.  07/04/2020, Cheral Marker 05/04/2020 12:42 PM

## 2020-05-06 LAB — URINE CULTURE

## 2020-06-03 ENCOUNTER — Other Ambulatory Visit: Payer: Self-pay | Admitting: Obstetrics & Gynecology

## 2020-06-03 DIAGNOSIS — Z3682 Encounter for antenatal screening for nuchal translucency: Secondary | ICD-10-CM

## 2020-06-04 ENCOUNTER — Ambulatory Visit: Payer: 59 | Admitting: *Deleted

## 2020-06-04 ENCOUNTER — Ambulatory Visit (INDEPENDENT_AMBULATORY_CARE_PROVIDER_SITE_OTHER): Payer: 59

## 2020-06-04 ENCOUNTER — Other Ambulatory Visit: Payer: 59

## 2020-06-04 ENCOUNTER — Other Ambulatory Visit: Payer: Self-pay

## 2020-06-04 ENCOUNTER — Encounter: Payer: 59 | Admitting: Women's Health

## 2020-06-04 DIAGNOSIS — Z3402 Encounter for supervision of normal first pregnancy, second trimester: Secondary | ICD-10-CM

## 2020-06-04 DIAGNOSIS — Z3A12 12 weeks gestation of pregnancy: Secondary | ICD-10-CM | POA: Diagnosis not present

## 2020-06-04 DIAGNOSIS — O10011 Pre-existing essential hypertension complicating pregnancy, first trimester: Secondary | ICD-10-CM

## 2020-06-04 DIAGNOSIS — Z3401 Encounter for supervision of normal first pregnancy, first trimester: Secondary | ICD-10-CM

## 2020-06-04 DIAGNOSIS — Z3A13 13 weeks gestation of pregnancy: Secondary | ICD-10-CM

## 2020-06-04 DIAGNOSIS — O0990 Supervision of high risk pregnancy, unspecified, unspecified trimester: Secondary | ICD-10-CM | POA: Insufficient documentation

## 2020-06-04 DIAGNOSIS — Z3682 Encounter for antenatal screening for nuchal translucency: Secondary | ICD-10-CM | POA: Diagnosis not present

## 2020-06-04 DIAGNOSIS — Z6841 Body Mass Index (BMI) 40.0 and over, adult: Secondary | ICD-10-CM

## 2020-06-04 DIAGNOSIS — Z3400 Encounter for supervision of normal first pregnancy, unspecified trimester: Secondary | ICD-10-CM | POA: Insufficient documentation

## 2020-06-04 NOTE — Progress Notes (Signed)
[place] 12+5 wks,measurements c/w dates,crl 61.58 mm,fhr 159 bpm,NB present,NT 1.4 mm,normal ovaries,limited view because of pt body habitus

## 2020-06-08 ENCOUNTER — Telehealth: Payer: Self-pay | Admitting: *Deleted

## 2020-06-08 NOTE — Telephone Encounter (Signed)
Patient made aware A1C almost in the diabetes range and will need to do early sugar test at her next visit.  Advised nothing to eat or drink after midnight.  Pt verbalized understanding with all other questions answered.

## 2020-06-09 LAB — OBSTETRIC PANEL, INCLUDING HIV
Antibody Screen: NEGATIVE
Basophils Absolute: 0 10*3/uL (ref 0.0–0.2)
Basos: 0 %
EOS (ABSOLUTE): 0 10*3/uL (ref 0.0–0.4)
Eos: 0 %
HIV Screen 4th Generation wRfx: NONREACTIVE
Hematocrit: 42.3 % (ref 34.0–46.6)
Hemoglobin: 13.6 g/dL (ref 11.1–15.9)
Hepatitis B Surface Ag: NEGATIVE
Immature Grans (Abs): 0.1 10*3/uL (ref 0.0–0.1)
Immature Granulocytes: 1 %
Lymphocytes Absolute: 3 10*3/uL (ref 0.7–3.1)
Lymphs: 25 %
MCH: 26.4 pg — ABNORMAL LOW (ref 26.6–33.0)
MCHC: 32.2 g/dL (ref 31.5–35.7)
MCV: 82 fL (ref 79–97)
Monocytes Absolute: 0.5 10*3/uL (ref 0.1–0.9)
Monocytes: 4 %
Neutrophils Absolute: 8.4 10*3/uL — ABNORMAL HIGH (ref 1.4–7.0)
Neutrophils: 70 %
Platelets: 259 10*3/uL (ref 150–450)
RBC: 5.16 x10E6/uL (ref 3.77–5.28)
RDW: 13.5 % (ref 11.7–15.4)
RPR Ser Ql: NONREACTIVE
Rh Factor: POSITIVE
Rubella Antibodies, IGG: 5.44 index (ref >0.99)
WBC: 12 10*3/uL — ABNORMAL HIGH (ref 3.4–10.8)

## 2020-06-09 LAB — INTEGRATED 1
Crown Rump Length: 61.6 mm
Gest. Age on Collection Date: 12.4 weeks
Maternal Age at EDD: 32.9 yr
Nuchal Translucency (NT): 1.4 mm
Number of Fetuses: 1
PAPP-A Value: 106.9 ng/mL
Weight: 360 [lb_av]

## 2020-06-09 LAB — HEPATITIS C ANTIBODY: Hep C Virus Ab: <0.1 s/co ratio (ref 0.0–0.9)

## 2020-06-09 LAB — HEMOGLOBIN A1C
Est. average glucose Bld gHb Est-mCnc: 137 mg/dL
Hgb A1c MFr Bld: 6.4 % — ABNORMAL HIGH (ref 4.8–5.6)

## 2020-06-10 ENCOUNTER — Telehealth: Payer: Self-pay | Admitting: Women's Health

## 2020-06-10 ENCOUNTER — Other Ambulatory Visit: Payer: Self-pay | Admitting: Advanced Practice Midwife

## 2020-06-10 MED ORDER — ONDANSETRON 4 MG PO TBDP: 4.0000 mg | ORAL_TABLET | Freq: Three times a day (TID) | ORAL | 1 refills | Status: DC | PRN

## 2020-06-10 NOTE — Telephone Encounter (Signed)
Left message @ 11:37 am. JSY ?

## 2020-06-10 NOTE — Telephone Encounter (Signed)
Pt called and wanted someone to go over her lab results

## 2020-06-10 NOTE — Telephone Encounter (Signed)
Pt aware her results look ok; will have integrated results once 2nd IT is done. Pt states she is having a lot of vomiting. Wants to try something to help. Thanks!! JSY

## 2020-06-13 ENCOUNTER — Encounter (HOSPITAL_COMMUNITY): Payer: Self-pay | Admitting: *Deleted

## 2020-06-13 ENCOUNTER — Emergency Department (HOSPITAL_COMMUNITY)
Admission: EM | Admit: 2020-06-13 | Discharge: 2020-06-13 | Disposition: A | Discharge status: Home / Self Care | Payer: 59 | Attending: Emergency Medicine | Admitting: Emergency Medicine

## 2020-06-13 ENCOUNTER — Other Ambulatory Visit: Payer: Self-pay

## 2020-06-13 DIAGNOSIS — E039 Hypothyroidism, unspecified: Secondary | ICD-10-CM | POA: Insufficient documentation

## 2020-06-13 DIAGNOSIS — N939 Abnormal uterine and vaginal bleeding, unspecified: Secondary | ICD-10-CM

## 2020-06-13 DIAGNOSIS — Z3A15 15 weeks gestation of pregnancy: Secondary | ICD-10-CM | POA: Insufficient documentation

## 2020-06-13 DIAGNOSIS — Z79899 Other long term (current) drug therapy: Secondary | ICD-10-CM | POA: Diagnosis not present

## 2020-06-13 DIAGNOSIS — O200 Threatened abortion: Secondary | ICD-10-CM | POA: Insufficient documentation

## 2020-06-13 DIAGNOSIS — I10 Essential (primary) hypertension: Secondary | ICD-10-CM | POA: Insufficient documentation

## 2020-06-13 LAB — URINALYSIS, ROUTINE W REFLEX MICROSCOPIC
Bilirubin Urine: NEGATIVE
Glucose, UA: NEGATIVE mg/dL
Ketones, ur: 20 mg/dL — AB
Nitrite: NEGATIVE
Protein, ur: NEGATIVE mg/dL
Specific Gravity, Urine: 1.023 (ref 1.005–1.030)
pH: 5 (ref 5.0–8.0)

## 2020-06-13 LAB — HCG, QUANTITATIVE, PREGNANCY: hCG, Beta Chain, Quant, S: 36904 m[IU]/mL — ABNORMAL HIGH (ref <5)

## 2020-06-13 LAB — COMPREHENSIVE METABOLIC PANEL
ALT: 16 U/L (ref 0–44)
AST: 20 U/L (ref 15–41)
Albumin: 3.5 g/dL (ref 3.5–5.0)
Alkaline Phosphatase: 52 U/L (ref 38–126)
Anion gap: 8 (ref 5–15)
BUN: 7 mg/dL (ref 6–20)
CO2: 25 mmol/L (ref 22–32)
Calcium: 9.4 mg/dL (ref 8.9–10.3)
Chloride: 104 mmol/L (ref 98–111)
Creatinine, Ser: 0.55 mg/dL (ref 0.44–1.00)
GFR, Estimated: >60 mL/min (ref >60)
Glucose, Bld: 128 mg/dL — ABNORMAL HIGH (ref 70–99)
Potassium: 4.1 mmol/L (ref 3.5–5.1)
Sodium: 137 mmol/L (ref 135–145)
Total Bilirubin: 0.3 mg/dL (ref 0.3–1.2)
Total Protein: 6.6 g/dL (ref 6.5–8.1)

## 2020-06-13 LAB — CBC WITH DIFFERENTIAL/PLATELET
Abs Immature Granulocytes: 0.08 10*3/uL — ABNORMAL HIGH (ref 0.00–0.07)
Basophils Absolute: 0 10*3/uL (ref 0.0–0.1)
Basophils Relative: 0 %
Eosinophils Absolute: 0 10*3/uL (ref 0.0–0.5)
Eosinophils Relative: 0 %
HCT: 40.8 % (ref 36.0–46.0)
Hemoglobin: 12.8 g/dL (ref 12.0–15.0)
Immature Granulocytes: 1 %
Lymphocytes Relative: 24 %
Lymphs Abs: 2.8 10*3/uL (ref 0.7–4.0)
MCH: 26.7 pg (ref 26.0–34.0)
MCHC: 31.4 g/dL (ref 30.0–36.0)
MCV: 85 fL (ref 80.0–100.0)
Monocytes Absolute: 0.5 10*3/uL (ref 0.1–1.0)
Monocytes Relative: 4 %
Neutro Abs: 8 10*3/uL — ABNORMAL HIGH (ref 1.7–7.7)
Neutrophils Relative %: 71 %
Platelets: 239 10*3/uL (ref 150–400)
RBC: 4.8 MIL/uL (ref 3.87–5.11)
RDW: 13.8 % (ref 11.5–15.5)
WBC: 11.3 10*3/uL — ABNORMAL HIGH (ref 4.0–10.5)
nRBC: 0 % (ref 0.0–0.2)

## 2020-06-13 NOTE — ED Triage Notes (Signed)
States she went to the bathroom and noticed bright red blood when she wiped and some blood in the toilet; pt states she has had some minor lower abdominal cramping

## 2020-06-13 NOTE — ED Provider Notes (Signed)
[facility] EMERGENCY DEPARTMENT Provider Note   CSN: [id number] Arrival date & time: 06/13/20  1436     History Chief Complaint  Patient presents with  . Vaginal Bleeding    [redacted] weeks pregnant     Madison Mcgrath is a 30 y.o. female.  Patient states that she is [redacted] weeks pregnant and she has been having abdominal cramping and bleeding today.  The pain seems to have  subsided now  The history is provided by the patient and medical records. No language interpreter was used.  Vaginal Bleeding Quality:  Bright red Severity:  Moderate Onset quality:  Sudden Progression:  Improving Chronicity:  New Possible pregnancy: yes   Context: not after intercourse   Relieved by:  Nothing Worsened by:  Nothing Ineffective treatments:  None tried Associated symptoms: no abdominal pain, no back pain and no fatigue   Risk factors: no bleeding disorder        Past Medical History:  Diagnosis Date  . Hypertension   . Thyroid disease     Patient Active Problem List   Diagnosis Date Noted  . Supervision of normal first pregnancy 06/04/2020  . Essential hypertension 08/02/2019  . Sexual dysfunction, psychological 11/22/2017  . Other specified hypothyroidism 05/01/2017  . Morbid obesity with BMI of 50.0-59.9, adult (HCC) 11/02/2015  . Plantar fasciitis 11/02/2015  . Skin lesion of back 11/02/2015  . Attention deficit hyperactivity disorder (ADHD) 04/05/2010  . Bipolar I disorder, single manic episode (HCC) 04/05/2010    Past Surgical History:  Procedure Laterality Date  . CHOLECYSTECTOMY    . LEG SURGERY Left    stick removed from leg     OB History    Gravida  1   Para  0   Term  0   Preterm  0   AB  0   Living  0     SAB  0   IAB  0   Ectopic  0   Multiple  0   Live Births  0           Family History  Problem Relation Age of Onset  . High blood pressure Mother   . Osteoporosis Mother   . Cancer Maternal Grandmother   . Diabetes Maternal Grandfather    . Colon cancer Paternal Grandmother   . Kidney failure Paternal Grandmother     Social History   Tobacco Use  . Smoking status: Never Smoker  . Smokeless tobacco: Never Used  Vaping Use  . Vaping Use: Former  Substance Use Topics  . Alcohol use: Not Currently  . Drug use: Never    Home Medications Prior to Admission medications   Medication Sig Start Date End Date Taking? Authorizing Provider  acetaminophen (TYLENOL) 325 MG tablet Take 650 mg by mouth every 6 (six) hours as needed.   Yes [provider]  labetalol (NORMODYNE) 100 MG tablet Take 1 tablet (100 mg total) by mouth 2 (two) times daily. Patient taking differently: Take 100 mg by mouth daily. 04/30/20  Yes Adline Potter, NP  levothyroxine (SYNTHROID) 88 MCG tablet Take 88 mcg by mouth every morning. 05/06/20  Yes [provider]  ondansetron (ZOFRAN ODT) 4 MG disintegrating tablet Take 1 tablet (4 mg total) by mouth every 8 (eight) hours as needed for nausea or vomiting. 06/10/20  Yes Arabella Merles, CNM  Prenatal Vit-Fe Fumarate-FA (PRENATAL VITAMIN PO) Take 1 tablet by mouth every morning.   Yes [provider]  cephALEXin (  KEFLEX) 500 MG capsule Take 1 capsule (500 mg total) by mouth 4 (four) times daily. X 7 days 05/04/20   Cheral Marker, CNM    Allergies    Bee venom  Review of Systems   Review of Systems  Constitutional: Negative for appetite change and fatigue.  HENT: Negative for congestion, ear discharge and sinus pressure.   Eyes: Negative for discharge.  Respiratory: Negative for cough.   Cardiovascular: Negative for chest pain.  Gastrointestinal: Negative for abdominal pain and diarrhea.  Genitourinary: Positive for vaginal bleeding. Negative for frequency and hematuria.  Musculoskeletal: Negative for back pain.  Skin: Negative for rash.  Neurological: Negative for seizures and headaches.  Psychiatric/Behavioral: Negative for hallucinations.    Physical  Exam Updated Vital Signs BP (!) 185/120   Pulse (!) 112   Temp 98.8 F (37.1 C) (Oral)   Resp 20   Ht 5\' 5"  (1.651 m)   Wt (!) 165.1 kg   LMP  (LMP Unknown)   SpO2 96%   BMI 60.57 kg/m   Physical Exam Vitals and nursing note reviewed.  Constitutional:      Appearance: She is well-developed.  HENT:     Head: Normocephalic.     Nose: Nose normal.  Eyes:     General: No scleral icterus.    Conjunctiva/sclera: Conjunctivae normal.  Neck:     Thyroid: No thyromegaly.  Cardiovascular:     Rate and Rhythm: Normal rate and regular rhythm.     Heart sounds: No murmur heard. No friction rub. No gallop.   Pulmonary:     Breath sounds: No stridor. No wheezing or rales.  Chest:     Chest wall: No tenderness.  Abdominal:     General: There is no distension.     Tenderness: There is no abdominal tenderness. There is no rebound.  Musculoskeletal:        General: Normal range of motion.     Cervical back: Neck supple.  Lymphadenopathy:     Cervical: No cervical adenopathy.  Skin:    Findings: No erythema or rash.  Neurological:     Mental Status: She is oriented to person, place, and time.     Motor: No abnormal muscle tone.     Coordination: Coordination normal.  Psychiatric:        Behavior: Behavior normal.     ED Results / Procedures / Treatments   Labs (all labs ordered are listed, but only abnormal results are displayed) Labs Reviewed  CBC WITH DIFFERENTIAL/PLATELET - Abnormal; Notable for the following components:      Result Value   WBC 11.3 (*)    Neutro Abs 8.0 (*)    Abs Immature Granulocytes 0.08 (*)    All other components within normal limits  COMPREHENSIVE METABOLIC PANEL - Abnormal; Notable for the following components:   Glucose, Bld 128 (*)    All other components within normal limits  HCG, QUANTITATIVE, PREGNANCY - Abnormal; Notable for the following components:   hCG, Beta Chain, Quant, S 36,904 (*)    All other components within normal limits   URINALYSIS, ROUTINE W REFLEX MICROSCOPIC - Abnormal; Notable for the following components:   APPearance HAZY (*)    Hgb urine dipstick LARGE (*)    Ketones, ur 20 (*)    Leukocytes,Ua MODERATE (*)    Bacteria, UA RARE (*)    All other components within normal limits  URINE CULTURE    EKG None  Radiology No results found.  Procedures Procedures   Medications Ordered in ED Medications - No data to display  ED Course  I have reviewed the triage vital signs and the nursing notes.  Pertinent labs & imaging results that were available during my care of the patient were reviewed by me and considered in my medical decision making (see chart for details).    MDM Rules/Calculators/A&P                         Patient with threatened miscarriage.  She will follow-up Monday with her doctor and if she gets worse over the weekend she will go to [facility].  Urinalysis has been cultured Final Clinical Impression(s) / ED Diagnoses Final diagnoses:  None    Rx / DC Orders ED Discharge Orders    None       Bethann Berkshire, MD 06/13/20 1758

## 2020-06-13 NOTE — Discharge Instructions (Addendum)
Call your OB/GYN Monday morning to get seen on Monday.  If you start having severe bleeding or pain over the weekend then you should go to [facility] in [place]

## 2020-06-15 ENCOUNTER — Other Ambulatory Visit (INDEPENDENT_AMBULATORY_CARE_PROVIDER_SITE_OTHER): Payer: 59 | Admitting: *Deleted

## 2020-06-15 ENCOUNTER — Other Ambulatory Visit (HOSPITAL_COMMUNITY)
Admission: RE | Admit: 2020-06-15 | Discharge: 2020-06-15 | Disposition: A | Discharge status: Home / Self Care | Payer: 59 | Source: Ambulatory Visit | Attending: Obstetrics & Gynecology | Admitting: Obstetrics & Gynecology

## 2020-06-15 ENCOUNTER — Encounter: Payer: Self-pay | Admitting: *Deleted

## 2020-06-15 ENCOUNTER — Other Ambulatory Visit: Payer: Self-pay

## 2020-06-15 DIAGNOSIS — N898 Other specified noninflammatory disorders of vagina: Secondary | ICD-10-CM

## 2020-06-15 DIAGNOSIS — R319 Hematuria, unspecified: Secondary | ICD-10-CM | POA: Diagnosis not present

## 2020-06-15 LAB — POCT URINALYSIS DIPSTICK OB
Glucose, UA: NEGATIVE
Ketones, UA: NEGATIVE
Nitrite, UA: NEGATIVE
POC,PROTEIN,UA: NEGATIVE

## 2020-06-15 LAB — URINE CULTURE

## 2020-06-15 NOTE — Progress Notes (Addendum)
   NURSE VISIT- UTI SYMPTOMS   SUBJECTIVE:  Madison Mcgrath is a 30 y.o. G70P0000 female here for UTI symptoms. She is [redacted]w[redacted]d pregnant. She reports flank pain bilaterally and blood in her urine.  She notices the bleeding only after using the bathroom but has not noticed any today. She is having some mild cramping as well but states she is not really drinking any water.  OBJECTIVE:  LMP  (LMP Unknown)   Appears well, in no apparent distress  Results for orders placed or performed in visit on 06/15/20 (from the past 24 hour(s))  POC Urinalysis Dipstick OB   Collection Time: 06/15/20 12:55 PM  Result Value Ref Range   Color, UA     Clarity, UA     Glucose, UA Negative Negative   Bilirubin, UA     Ketones, UA neg    Spec Grav, UA     Blood, UA moderate    pH, UA     POC,PROTEIN,UA Negative Negative, Trace, Small (1+), Moderate (2+), Large (3+), 4+   Urobilinogen, UA     Nitrite, UA neg    Leukocytes, UA Small (1+) (A) Negative   Appearance     Odor      ASSESSMENT: Pregnancy [redacted]w[redacted]d with UTI symptoms and negative nitrites  PLAN: Discussed with Joellyn Haff, CNM, [facility]   Rx sent by provider today: No Urine culture sent Call or return to clinic prn if these symptoms worsen or fail to improve as anticipated. Follow-up: as scheduled   Jobe Marker  06/15/2020 5:15 PM   Chart reviewed for nurse visit. Agree with plan of care.  Cheral Marker, [place] 06/16/2020 9:32 AM

## 2020-06-16 ENCOUNTER — Other Ambulatory Visit: Payer: Self-pay | Admitting: Women's Health

## 2020-06-16 LAB — CERVICOVAGINAL ANCILLARY ONLY
Bacterial Vaginitis (gardnerella): POSITIVE — AB
Candida Glabrata: NEGATIVE
Candida Vaginitis: POSITIVE — AB
Chlamydia: NEGATIVE
Comment: NEGATIVE
Comment: NEGATIVE
Comment: NEGATIVE
Comment: NEGATIVE
Comment: NEGATIVE
Comment: NORMAL
Neisseria Gonorrhea: NEGATIVE
Trichomonas: NEGATIVE

## 2020-06-16 MED ORDER — METRONIDAZOLE 500 MG PO TABS: 500.0000 mg | ORAL_TABLET | Freq: Two times a day (BID) | ORAL | 0 refills | Status: DC

## 2020-06-17 ENCOUNTER — Encounter: Payer: Self-pay | Admitting: Emergency Medicine

## 2020-06-17 ENCOUNTER — Ambulatory Visit
Admission: EM | Admit: 2020-06-17 | Discharge: 2020-06-17 | Disposition: A | Discharge status: Home / Self Care | Payer: 59

## 2020-06-17 ENCOUNTER — Other Ambulatory Visit: Payer: Self-pay

## 2020-06-17 DIAGNOSIS — M25562 Pain in left knee: Secondary | ICD-10-CM | POA: Diagnosis not present

## 2020-06-17 DIAGNOSIS — S8392XA Sprain of unspecified site of left knee, initial encounter: Secondary | ICD-10-CM

## 2020-06-17 LAB — URINE CULTURE

## 2020-06-17 NOTE — ED Provider Notes (Signed)
[facility]    CSN: [id number] Arrival date & time: 06/17/20  1122      History   Chief Complaint No chief complaint on file.   HPI Madison Mcgrath is a 30 y.o. female.   Reports left knee pain since yesterday.  Reports that pain is worse with weightbearing and activity.  Denies known injury.  Reports that she is [redacted] weeks pregnant.  [facility]  Has not attempted OTC treatment.  Denies previous symptoms.  Denies numbness, tingling, radiating pain, loss of strength, chills, body aches, other symptoms.  ROS per HPI  The history is provided by the patient.    Past Medical History:  Diagnosis Date  . Hypertension   . Thyroid disease     Patient Active Problem List   Diagnosis Date Noted  . Supervision of normal first pregnancy 06/04/2020  . Essential hypertension 08/02/2019  . Sexual dysfunction, psychological 11/22/2017  . Other specified hypothyroidism 05/01/2017  . Morbid obesity with BMI of 50.0-59.9, adult (HCC) 11/02/2015  . Plantar fasciitis 11/02/2015  . Skin lesion of back 11/02/2015  . Attention deficit hyperactivity disorder (ADHD) 04/05/2010  . Bipolar I disorder, single manic episode (HCC) 04/05/2010    Past Surgical History:  Procedure Laterality Date  . CHOLECYSTECTOMY    . LEG SURGERY Left    stick removed from leg    OB History    Gravida  1   Para  0   Term  0   Preterm  0   AB  0   Living  0     SAB  0   IAB  0   Ectopic  0   Multiple  0   Live Births  0            Home Medications    Prior to Admission medications   Medication Sig Start Date End Date Taking? Authorizing Provider  metroNIDAZOLE (FLAGYL) 500 MG tablet Take 1 tablet (500 mg total) by mouth 2 (two) times daily. 06/16/20   Cheral Marker, CNM  acetaminophen (TYLENOL) 325 MG tablet Take 650 mg by mouth every 6 (six) hours as needed.    [provider]  cephALEXin (KEFLEX) 500 MG capsule Take 1 capsule (500  mg total) by mouth 4 (four) times daily. X 7 days 05/04/20   Cheral Marker, CNM  labetalol (NORMODYNE) 100 MG tablet Take 1 tablet (100 mg total) by mouth 2 (two) times daily. Patient taking differently: Take 100 mg by mouth daily. 04/30/20   Adline Potter, NP  levothyroxine (SYNTHROID) 88 MCG tablet Take 88 mcg by mouth every morning. 05/06/20   [provider]  ondansetron (ZOFRAN ODT) 4 MG disintegrating tablet Take 1 tablet (4 mg total) by mouth every 8 (eight) hours as needed for nausea or vomiting. 06/10/20   Arabella Merles, CNM  Prenatal Vit-Fe Fumarate-FA (PRENATAL VITAMIN PO) Take 1 tablet by mouth every morning.    [provider]    Family History Family History  Problem Relation Age of Onset  . High blood pressure Mother   . Osteoporosis Mother   . Cancer Maternal Grandmother   . Diabetes Maternal Grandfather   . Colon cancer Paternal Grandmother   . Kidney failure Paternal Grandmother     Social History Social History   Tobacco Use  . Smoking status: Never Smoker  . Smokeless tobacco: Never Used  Vaping Use  . Vaping Use: Former  Substance  Use Topics  . Alcohol use: Not Currently  . Drug use: Never     Allergies   Bee venom   Review of Systems Review of Systems   Physical Exam Triage Vital Signs ED Triage Vitals  Enc Vitals Group     BP 06/17/20 1130 137/78     Pulse Rate 06/17/20 1130 (!) 107     Resp 06/17/20 1130 18     Temp 06/17/20 1130 99.1 F (37.3 C)     Temp Source 06/17/20 1130 Oral     SpO2 06/17/20 1130 98 %     Weight --      Height --      Head Circumference --      Peak Flow --      Pain Score 06/17/20 1132 5     Pain Loc --      Pain Edu? --      Excl. in GC? --    No data found.  Updated Vital Signs BP 137/78   Pulse (!) 107   Temp 99.1 F (37.3 C) (Oral)   Resp 18   LMP  (LMP Unknown)   SpO2 98%   Visual Acuity Right Eye Distance:   Left Eye Distance:   Bilateral Distance:    Right  Eye Near:   Left Eye Near:    Bilateral Near:     Physical Exam Vitals and nursing note reviewed.  Constitutional:      General: She is not in acute distress.    Appearance: Normal appearance. She is well-developed. She is obese. She is not ill-appearing.  HENT:     Head: Normocephalic and atraumatic.     Nose: Nose normal.     Mouth/Throat:     Mouth: Mucous membranes are moist.     Pharynx: Oropharynx is clear.  Eyes:     Extraocular Movements: Extraocular movements intact.     Conjunctiva/sclera: Conjunctivae normal.     Pupils: Pupils are equal, round, and reactive to light.  Cardiovascular:     Rate and Rhythm: Normal rate and regular rhythm.  Pulmonary:     Effort: Pulmonary effort is normal.  Musculoskeletal:        General: Swelling and tenderness (Mild swelling and tenderness noted to medial aspect of the left knee, no erythema, no heat noted) present. No deformity or signs of injury.     Cervical back: Normal range of motion and neck supple.     Right lower leg: No edema.     Left lower leg: No edema.  Skin:    General: Skin is warm and dry.     Capillary Refill: Capillary refill takes less than 2 seconds.  Neurological:     General: No focal deficit present.     Mental Status: She is alert and oriented to person, place, and time.  Psychiatric:        Mood and Affect: Mood normal.        Behavior: Behavior normal.        Thought Content: Thought content normal.      UC Treatments / Results  Labs (all labs ordered are listed, but only abnormal results are displayed) Labs Reviewed - No data to display  EKG   Radiology No results found.  Procedures Procedures (including critical care time)  Medications Ordered in UC Medications - No data to display  Initial Impression / Assessment and Plan / UC Course  I have reviewed the triage vital signs and the  nursing notes.  Pertinent labs & imaging results that were available during my care of the patient  were reviewed by me and considered in my medical decision making (see chart for details).    Left knee pain Left knee sprain  Ace wrap applied in office Wear this when you are up and active May use ice to the area, elevate, rest May take Tylenol as needed for pain Follow up with this office or with primary care if symptoms are persisting.  Follow up in the ER for high fever, trouble swallowing, trouble breathing, other concerning symptoms.   Final Clinical Impressions(s) / UC Diagnoses   Final diagnoses:  Acute pain of left knee  Sprain of left knee, unspecified ligament, initial encounter     Discharge Instructions     You may take tylenol for pain  We have placed an ace wrap to your knee. Wear this when you are up and active.  Keep the area wrapped, you may use ice, elevation, rest.  Follow up with orthopedics if symptoms are persisting     ED Prescriptions    None     PDMP not reviewed this encounter.   Moshe Cipro, NP 06/17/20 1531

## 2020-06-17 NOTE — Discharge Instructions (Addendum)
You may take tylenol for pain  We have placed an ace wrap to your knee. Wear this when you are up and active.  Keep the area wrapped, you may use ice, elevation, rest.  Follow up with orthopedics if symptoms are persisting

## 2020-06-17 NOTE — ED Triage Notes (Signed)
Left knee pain since yesterday.  Hurts to bend knee and when standing straight.  No known injury.

## 2020-06-18 ENCOUNTER — Ambulatory Visit: Payer: 59 | Admitting: *Deleted

## 2020-06-18 ENCOUNTER — Ambulatory Visit (INDEPENDENT_AMBULATORY_CARE_PROVIDER_SITE_OTHER): Payer: 59 | Admitting: Women's Health

## 2020-06-18 ENCOUNTER — Other Ambulatory Visit: Payer: Self-pay

## 2020-06-18 ENCOUNTER — Telehealth: Payer: Self-pay | Admitting: *Deleted

## 2020-06-18 ENCOUNTER — Encounter: Payer: Self-pay | Admitting: Women's Health

## 2020-06-18 ENCOUNTER — Other Ambulatory Visit: Payer: 59

## 2020-06-18 VITALS — BP 155/96 | HR 114 | Wt 364.0 lb

## 2020-06-18 DIAGNOSIS — Z3A14 14 weeks gestation of pregnancy: Secondary | ICD-10-CM | POA: Diagnosis not present

## 2020-06-18 DIAGNOSIS — O0992 Supervision of high risk pregnancy, unspecified, second trimester: Secondary | ICD-10-CM

## 2020-06-18 DIAGNOSIS — Z131 Encounter for screening for diabetes mellitus: Secondary | ICD-10-CM

## 2020-06-18 DIAGNOSIS — O10919 Unspecified pre-existing hypertension complicating pregnancy, unspecified trimester: Secondary | ICD-10-CM | POA: Diagnosis not present

## 2020-06-18 DIAGNOSIS — E039 Hypothyroidism, unspecified: Secondary | ICD-10-CM

## 2020-06-18 DIAGNOSIS — Z363 Encounter for antenatal screening for malformations: Secondary | ICD-10-CM

## 2020-06-18 DIAGNOSIS — Z3402 Encounter for supervision of normal first pregnancy, second trimester: Secondary | ICD-10-CM

## 2020-06-18 MED ORDER — ASPIRIN 81 MG PO TBEC: 162.0000 mg | DELAYED_RELEASE_TABLET | Freq: Every day | ORAL | 2 refills | Status: DC

## 2020-06-18 MED ORDER — DOXYLAMINE-PYRIDOXINE 10-10 MG PO TBEC: DELAYED_RELEASE_TABLET | ORAL | 6 refills | Status: DC

## 2020-06-18 NOTE — Telephone Encounter (Signed)
Pt's insurance does not cover Diclegis. I spoke with Selena Batten B. Pt can take Zofran every morning or she can try Unisom and Vit B6. Pt will take Zofran every morning. JSY

## 2020-06-18 NOTE — Progress Notes (Signed)
INITIAL OBSTETRICAL VISIT Patient name: Madison Mcgrath MRN [id number]  Date of birth: 1990/08/31 Chief Complaint:   Initial Prenatal Visit (Hard time eating)  History of Present Illness:   Madison Mcgrath is a 30 y.o. G46P0000 Caucasian female at [redacted]w[redacted]d by [place] at 7 weeks with an Estimated Date of Delivery: 12/12/20 being seen today for her initial obstetrical visit.   Her obstetrical history is significant for primigravida.    Today she reports hard time eating, some things stay down, some come back up. Some nausea, but most of time just hits quickly. Sometimes vomits after coughing. CHTN on labetalol 100mg  BID, hasn't taken today Hypothyroidism on synthroid Dep/anx/bipolar- no meds, doing well Depression screen [facility] 2/9 06/18/2020  Decreased Interest 0  Down, Depressed, Hopeless 1  PHQ - 2 Score 1  Altered sleeping 0  Tired, decreased energy 1  Change in appetite 1  Feeling bad or failure about yourself  0  Trouble concentrating 0  Moving slowly or fidgety/restless 0  Suicidal thoughts 0  PHQ-9 Score 3    No LMP recorded (lmp unknown). Patient is pregnant. Last pap 10/16/18. Results were: NILM w/ HRHPV negative Review of Systems:   Pertinent items are noted in HPI Denies cramping/contractions, leakage of fluid, vaginal bleeding, abnormal vaginal discharge w/ itching/odor/irritation, headaches, visual changes, shortness of breath, chest pain, abdominal pain, severe nausea/vomiting, or problems with urination or bowel movements unless otherwise stated above.  Pertinent History Reviewed:  Reviewed past medical,surgical, social, obstetrical and family history.  Reviewed problem list, medications and allergies. OB History  Gravida Para Term Preterm AB Living  1 0 0 0 0 0  SAB IAB Ectopic Multiple Live Births  0 0 0 0 0    # Outcome Date GA Lbr Len/2nd Weight Sex Delivery Anes PTL Lv  1 Current            Physical Assessment:   Vitals:   06/18/20 0924  BP: (!) 155/96  Pulse: (!) 114   Weight: (!) 364 lb (165.1 kg)  Body mass index is 60.57 kg/m.       Physical Examination:  General appearance - well appearing, and in no distress  Mental status - alert, oriented to person, place, and time  Psych:  She has a normal mood and affect  Skin - warm and dry, normal color, no suspicious lesions noted  Chest - effort normal, all lung fields clear to auscultation bilaterally  Heart - normal rate and regular rhythm  Abdomen - soft, nontender  Extremities:  No swelling or varicosities noted  Thin prep pap is not done  Chaperone: N/A    Informal TA u/s: +FCA and active fetus  No results found for this or any previous visit (from the past 24 hour(s)).  Assessment & Plan:  1) High-Risk Pregnancy G1P0000 at [redacted]w[redacted]d with an Estimated Date of Delivery: 12/12/20   2) Initial OB visit  3) CHTN> on Labetalol 100mg  BID, hasn't taken today. Start ASA 162mg , baseline labs today  4) Hypothyroidism> on synthroid 12/14/20, check labs today  5) Dep/anx/Bipolar> no meds, doing well  6) Pre-diabetes> A1C 6.4 on 5/12, doing 2hr GTT today  7) Nausea> rx diclegis  Meds:  Meds ordered this encounter  Medications  . aspirin 81 MG EC tablet    Sig: Take 2 tablets (162 mg total) by mouth daily. Swallow whole.    Dispense:  180 tablet    Refill:  2    Order Specific Question:  Supervising Provider    Answer:   Duane Lope H [[id number]]  . Doxylamine-Pyridoxine (DICLEGIS) 10-10 MG TBEC    Sig: 2 tabs q hs, if sx persist add 1 tab q am on day 3, if sx persist add 1 tab q afternoon on day 4    Dispense:  100 tablet    Refill:  6    Order Specific Question:   Supervising Provider    Answer:   Duane Lope H [[id number]]    Initial labs obtained Continue prenatal vitamins Reviewed n/v relief measures and warning s/s to report Reviewed recommended weight gain based on pre-gravid BMI Encouraged well-balanced diet Genetic & carrier screening discussed: requests Panorama and NT/IT, declines Horizon  14  Ultrasound discussed; fetal survey: requested CCNC completed> form faxed if has or is planning to apply for medicaid The nature of [facility] [employer] with multiple MDs and other Advanced Practice Providers was explained to patient; also emphasized that fellows, residents, and students are part of our team.  Follow-up: Return in about 4 weeks (around 07/16/2020) for HROB, NO:[id number], 2nd IT, MD or CNM, in person.   Orders Placed This Encounter  Procedures  . US OB Comp + 14 Wk  . Pain Management Screening Profile (10S)  . Protein / creatinine ratio, urine  . Hgb Fractionation Cascade  . TSH  . T3  . T4, free    Cheral Marker CNM, [facility] 06/18/2020 12:52 PM

## 2020-06-18 NOTE — Patient Instructions (Signed)
Madison Mcgrath, I greatly value your feedback.  If you receive a survey following your visit with [place] today, we appreciate you taking the time to fill it out.  Thanks, Joellyn Haff, CNM, WHNP-BC  [facility] ([address] [place], [place] [address]) Entrance C, located off of [address] valet parking  Go to [email or web address] to register for FREE online childbirth classes  [place] Pediatricians/Family Doctors:  [facility] [phone]            [facility] [phone]                 [facility] [phone] (usually not accepting new patients unless you have family there already, you are always welcome to call and ask)       [facility] Department [phone]       [facility] Pediatricians/Family Doctors:   [facility]: [phone]  [facility]: [phone]  [facility]: [phone]  [facility] Doctors:   [facility]: [phone]   [facility]: [phone]  [facility] Doctors:  [facility]: [phone]    Home Blood Pressure Monitoring for Patients   Your provider has recommended that you check your blood pressure (BP) at least once a week at home. If you do not have a blood pressure cuff at home, one will be provided for you. Contact your provider if you have not received your monitor within 1 week.   Helpful Tips for Accurate Home Blood Pressure Checks  . Don't smoke, exercise, or drink caffeine 30 minutes before checking your BP . Use the restroom before checking your BP (a full bladder can raise your pressure) . Relax in a comfortable upright chair . Feet on the ground . Left arm resting comfortably on a flat surface at the level of your heart . Legs uncrossed . Back supported . Sit quietly and don't talk . Place the cuff on your bare arm . Adjust snuggly, so that  only two fingertips can fit between your skin and the top of the cuff . Check 2 readings separated by at least one minute . Keep a log of your BP readings . For a visual, please reference this diagram: http://ccnc.care/bpdiagram  Provider Name: [facility]     Phone: [phone]  Zone 1: ALL CLEAR  Continue to monitor your symptoms:  . BP reading is less than 140 (top number) or less than 90 (bottom number)  . No right upper stomach pain . No headaches or seeing spots . No feeling nauseated or throwing up . No swelling in face and hands  Zone 2: CAUTION Call your doctor's office for any of the following:  . BP reading is greater than 140 (top number) or greater than 90 (bottom number)  . Stomach pain under your ribs in the middle or right side . Headaches or seeing spots . Feeling nauseated or throwing up . Swelling in face and hands  Zone 3: EMERGENCY  Seek immediate medical care if you have any of the following:  . BP reading is greater than160 (top number) or greater than 110 (bottom number) . Severe headaches not improving with Tylenol . Serious difficulty catching your breath . Any worsening symptoms from Zone 2     Second Trimester of Pregnancy The second trimester is from week 14 through week 27 (months 4 through 6). The second trimester is often a time when you feel your  best. Your body has adjusted to being pregnant, and you begin to feel better physically. Usually, morning sickness has lessened or quit completely, you may have more energy, and you may have an increase in appetite. The second trimester is also a time when the fetus is growing rapidly. At the end of the sixth month, the fetus is about 9 inches long and weighs about 1 pounds. You will likely begin to feel the baby move (quickening) between 16 and 20 weeks of pregnancy. Body changes during your second trimester Your body continues to go through many changes during your second trimester. The changes  vary from woman to woman.  Your weight will continue to increase. You will notice your lower abdomen bulging out.  You may begin to get stretch marks on your hips, abdomen, and breasts.  You may develop headaches that can be relieved by medicines. The medicines should be approved by your health care provider.  You may urinate more often because the fetus is pressing on your bladder.  You may develop or continue to have heartburn as a result of your pregnancy.  You may develop constipation because certain hormones are causing the muscles that push waste through your intestines to slow down.  You may develop hemorrhoids or swollen, bulging veins (varicose veins).  You may have back pain. This is caused by: ? Weight gain. ? Pregnancy hormones that are relaxing the joints in your pelvis. ? A shift in weight and the muscles that support your balance.  Your breasts will continue to grow and they will continue to become tender.  Your gums may bleed and may be sensitive to brushing and flossing.  Dark spots or blotches (chloasma, mask of pregnancy) may develop on your face. This will likely fade after the baby is born.  A dark line from your belly button to the pubic area (linea nigra) may appear. This will likely fade after the baby is born.  You may have changes in your hair. These can include thickening of your hair, rapid growth, and changes in texture. Some women also have hair loss during or after pregnancy, or hair that feels dry or thin. Your hair will most likely return to normal after your baby is born.  What to expect at prenatal visits During a routine prenatal visit:  You will be weighed to make sure you and the fetus are growing normally.  Your blood pressure will be taken.  Your abdomen will be measured to track your baby's growth.  The fetal heartbeat will be listened to.  Any test results from the previous visit will be discussed.  Your health care provider may  ask you:  How you are feeling.  If you are feeling the baby move.  If you have had any abnormal symptoms, such as leaking fluid, bleeding, severe headaches, or abdominal cramping.  If you are using any tobacco products, including cigarettes, chewing tobacco, and electronic cigarettes.  If you have any questions.  Other tests that may be performed during your second trimester include:  Blood tests that check for: ? Low iron levels (anemia). ? High blood sugar that affects pregnant women (gestational diabetes) between 79 and 28 weeks. ? Rh antibodies. This is to check for a protein on red blood cells (Rh factor).  Urine tests to check for infections, diabetes, or protein in the urine.  An ultrasound to confirm the proper growth and development of the baby.  An amniocentesis to check for possible genetic problems.  Fetal  screens for spina bifida and Down syndrome.  HIV (human immunodeficiency virus) testing. Routine prenatal testing includes screening for HIV, unless you choose not to have this test.  Follow these instructions at home: Medicines  Follow your health care provider's instructions regarding medicine use. Specific medicines may be either safe or unsafe to take during pregnancy.  Take a prenatal vitamin that contains at least 600 micrograms (mcg) of folic acid.  If you develop constipation, try taking a stool softener if your health care provider approves. Eating and drinking  Eat a balanced diet that includes fresh fruits and vegetables, whole grains, good sources of protein such as meat, eggs, or tofu, and low-fat dairy. Your health care provider will help you determine the amount of weight gain that is right for you.  Avoid raw meat and uncooked cheese. These carry germs that can cause birth defects in the baby.  If you have low calcium intake from food, talk to your health care provider about whether you should take a daily calcium supplement.  Limit foods  that are high in fat and processed sugars, such as fried and sweet foods.  To prevent constipation: ? Drink enough fluid to keep your urine clear or pale yellow. ? Eat foods that are high in fiber, such as fresh fruits and vegetables, whole grains, and beans. Activity  Exercise only as directed by your health care provider. Most women can continue their usual exercise routine during pregnancy. Try to exercise for 30 minutes at least 5 days a week. Stop exercising if you experience uterine contractions.  Avoid heavy lifting, wear low heel shoes, and practice good posture.  A sexual relationship may be continued unless your health care provider directs you otherwise. Relieving pain and discomfort  Wear a good support bra to prevent discomfort from breast tenderness.  Take warm sitz baths to soothe any pain or discomfort caused by hemorrhoids. Use hemorrhoid cream if your health care provider approves.  Rest with your legs elevated if you have leg cramps or low back pain.  If you develop varicose veins, wear support hose. Elevate your feet for 15 minutes, 3-4 times a day. Limit salt in your diet. Prenatal Care  Write down your questions. Take them to your prenatal visits.  Keep all your prenatal visits as told by your health care provider. This is important. Safety  Wear your seat belt at all times when driving.  Make a list of emergency phone numbers, including numbers for family, friends, the hospital, and police and fire departments. General instructions  Ask your health care provider for a referral to a local prenatal education class. Begin classes no later than the beginning of month 6 of your pregnancy.  Ask for help if you have counseling or nutritional needs during pregnancy. Your health care provider can offer advice or refer you to specialists for help with various needs.  Do not use hot tubs, steam rooms, or saunas.  Do not douche or use tampons or scented sanitary  pads.  Do not cross your legs for long periods of time.  Avoid cat litter boxes and soil used by cats. These carry germs that can cause birth defects in the baby and possibly loss of the fetus by miscarriage or stillbirth.  Avoid all smoking, herbs, alcohol, and unprescribed drugs. Chemicals in these products can affect the formation and growth of the baby.  Do not use any products that contain nicotine or tobacco, such as cigarettes and e-cigarettes. If you need help  quitting, ask your health care provider.  Visit your dentist if you have not gone yet during your pregnancy. Use a soft toothbrush to brush your teeth and be gentle when you floss. Contact a health care provider if:  You have dizziness.  You have mild pelvic cramps, pelvic pressure, or nagging pain in the abdominal area.  You have persistent nausea, vomiting, or diarrhea.  You have a bad smelling vaginal discharge.  You have pain when you urinate. Get help right away if:  You have a fever.  You are leaking fluid from your vagina.  You have spotting or bleeding from your vagina.  You have severe abdominal cramping or pain.  You have rapid weight gain or weight loss.  You have shortness of breath with chest pain.  You notice sudden or extreme swelling of your face, hands, ankles, feet, or legs.  You have not felt your baby move in over an hour.  You have severe headaches that do not go away when you take medicine.  You have vision changes. Summary  The second trimester is from week 14 through week 27 (months 4 through 6). It is also a time when the fetus is growing rapidly.  Your body goes through many changes during pregnancy. The changes vary from woman to woman.  Avoid all smoking, herbs, alcohol, and unprescribed drugs. These chemicals affect the formation and growth your baby.  Do not use any tobacco products, such as cigarettes, chewing tobacco, and e-cigarettes. If you need help quitting, ask your  health care provider.  Contact your health care provider if you have any questions. Keep all prenatal visits as told by your health care provider. This is important. This information is not intended to replace advice given to you by your health care provider. Make sure you discuss any questions you have with your health care provider. Document Released: 01/04/2001 Document Revised: 06/18/2015 Document Reviewed: 03/13/2012 Elsevier Interactive Patient Education  2017 [employer].

## 2020-06-19 ENCOUNTER — Other Ambulatory Visit: Payer: Self-pay | Admitting: *Deleted

## 2020-06-19 ENCOUNTER — Encounter: Payer: Self-pay | Admitting: Women's Health

## 2020-06-19 DIAGNOSIS — O24410 Gestational diabetes mellitus in pregnancy, diet controlled: Secondary | ICD-10-CM

## 2020-06-19 DIAGNOSIS — Z8632 Personal history of gestational diabetes: Secondary | ICD-10-CM | POA: Insufficient documentation

## 2020-06-19 DIAGNOSIS — O24419 Gestational diabetes mellitus in pregnancy, unspecified control: Secondary | ICD-10-CM | POA: Insufficient documentation

## 2020-06-19 LAB — PROTEIN / CREATININE RATIO, URINE
Creatinine, Urine: 160 mg/dL
Protein, Ur: 6.6 mg/dL
Protein/Creat Ratio: 41 mg/g creat (ref 0–200)

## 2020-06-19 LAB — GLUCOSE TOLERANCE, 2 HOURS W/ 1HR
Glucose, 1 hour: 238 mg/dL — ABNORMAL HIGH (ref 65–179)
Glucose, 2 hour: 189 mg/dL — ABNORMAL HIGH (ref 65–152)
Glucose, Fasting: 133 mg/dL — ABNORMAL HIGH (ref 65–91)

## 2020-06-19 LAB — MED LIST OPTION NOT SELECTED

## 2020-06-19 LAB — PMP SCREEN PROFILE (10S), URINE
Amphetamine Scrn, Ur: NEGATIVE ng/mL
BARBITURATE SCREEN URINE: NEGATIVE ng/mL
BENZODIAZEPINE SCREEN, URINE: NEGATIVE ng/mL
CANNABINOIDS UR QL SCN: NEGATIVE ng/mL
Cocaine (Metab) Scrn, Ur: NEGATIVE ng/mL
Creatinine(Crt), U: 177.9 mg/dL (ref 20.0–300.0)
Methadone Screen, Urine: NEGATIVE ng/mL
OXYCODONE+OXYMORPHONE UR QL SCN: NEGATIVE ng/mL
Opiate Scrn, Ur: NEGATIVE ng/mL
Ph of Urine: 5.5 (ref 4.5–8.9)
Phencyclidine Qn, Ur: NEGATIVE ng/mL
Propoxyphene Scrn, Ur: NEGATIVE ng/mL

## 2020-06-19 LAB — T3: T3, Total: 335 ng/dL — ABNORMAL HIGH (ref 71–180)

## 2020-06-19 LAB — TSH: TSH: 2.27 u[IU]/mL (ref 0.450–4.500)

## 2020-06-19 LAB — T4, FREE: Free T4: 0.82 ng/dL (ref 0.82–1.77)

## 2020-06-19 MED ORDER — GLUCOSE BLOOD VI STRP: ORAL_STRIP | 12 refills | Status: DC

## 2020-06-19 MED ORDER — ONETOUCH VERIO W/DEVICE KIT: PACK | 0 refills | Status: DC

## 2020-06-19 MED ORDER — ONETOUCH ULTRASOFT LANCETS MISC: 12 refills | Status: DC

## 2020-06-23 ENCOUNTER — Telehealth: Payer: Self-pay

## 2020-06-23 LAB — HGB FRACTIONATION CASCADE
Hgb A2: 2.4 % (ref 1.8–3.2)
Hgb A: 97.6 % (ref 96.4–98.8)
Hgb F: 0 % (ref 0.0–2.0)
Hgb S: 0 %

## 2020-06-24 ENCOUNTER — Other Ambulatory Visit: Payer: Self-pay | Admitting: *Deleted

## 2020-06-24 MED ORDER — ONETOUCH DELICA PLUS LANCET30G MISC: 12 refills | Status: DC

## 2020-06-24 NOTE — Addendum Note (Signed)
Addended by: Moss Mc on: 06/24/2020 11:20 AM   Modules accepted: Orders

## 2020-06-29 ENCOUNTER — Other Ambulatory Visit: Payer: Self-pay

## 2020-06-29 ENCOUNTER — Other Ambulatory Visit (INDEPENDENT_AMBULATORY_CARE_PROVIDER_SITE_OTHER): Payer: 59

## 2020-06-29 DIAGNOSIS — O0992 Supervision of high risk pregnancy, unspecified, second trimester: Secondary | ICD-10-CM

## 2020-06-29 NOTE — Progress Notes (Signed)
   NURSE VISIT- [facility]  SUBJECTIVE:  Madison Mcgrath is a 30 y.o. G110P0000 female here for [facility] NIPT redraw due to insufficient DNA reported. She is [redacted]w[redacted]d pregnant.   OBJECTIVE:  Appears well, in no apparent distress  Blood work drawn from right [facility] without difficulty. 1 attempt(s).   ASSESSMENT: Pregnancy [redacted]w[redacted]d [facility] NIPT here for redraw.  PLAN: [facility] portal information given and instructed patient how to access results Follow-up as scheduled  Jobe Marker  06/29/2020 10:04 AM

## 2020-07-01 ENCOUNTER — Ambulatory Visit: Payer: 59

## 2020-07-08 ENCOUNTER — Encounter: Payer: 59 | Attending: Advanced Practice Midwife | Admitting: Registered"

## 2020-07-08 ENCOUNTER — Encounter: Payer: Self-pay | Admitting: Registered"

## 2020-07-08 ENCOUNTER — Other Ambulatory Visit: Payer: Self-pay

## 2020-07-08 DIAGNOSIS — O24419 Gestational diabetes mellitus in pregnancy, unspecified control: Secondary | ICD-10-CM

## 2020-07-08 NOTE — Progress Notes (Signed)
Patient was seen on 07/08/20 for Gestational Diabetes self-management class at [facility]. The following learning objectives were met by the patient during this course:  States the definition of Gestational Diabetes States why dietary management is important in controlling blood glucose Describes the effects each nutrient has on blood glucose levels Demonstrates ability to create a balanced meal plan Demonstrates carbohydrate counting  States when to check blood glucose levels Demonstrates proper blood glucose monitoring techniques States the effect of stress and exercise on blood glucose levels States the importance of limiting caffeine and abstaining from alcohol and smoking  Blood glucose monitor given: Patient has meter and is checking blood sugar prior to class  Patient instructed to monitor glucose levels: FBS: 60 - <95; 1 hour: <140; 2 hour: <120  Patient received handouts: Nutrition Diabetes and Pregnancy, including carb counting list  Patient will be seen for follow-up as needed. 

## 2020-07-09 ENCOUNTER — Other Ambulatory Visit: Payer: 59

## 2020-07-16 ENCOUNTER — Other Ambulatory Visit: Payer: Self-pay

## 2020-07-16 ENCOUNTER — Ambulatory Visit (INDEPENDENT_AMBULATORY_CARE_PROVIDER_SITE_OTHER): Payer: 59 | Admitting: Obstetrics & Gynecology

## 2020-07-16 ENCOUNTER — Encounter: Payer: Self-pay | Admitting: Obstetrics & Gynecology

## 2020-07-16 ENCOUNTER — Ambulatory Visit (INDEPENDENT_AMBULATORY_CARE_PROVIDER_SITE_OTHER): Payer: 59

## 2020-07-16 VITALS — BP 128/68 | HR 94 | Wt 354.0 lb

## 2020-07-16 DIAGNOSIS — Z1379 Encounter for other screening for genetic and chromosomal anomalies: Secondary | ICD-10-CM

## 2020-07-16 DIAGNOSIS — Z3402 Encounter for supervision of normal first pregnancy, second trimester: Secondary | ICD-10-CM

## 2020-07-16 DIAGNOSIS — Z3A18 18 weeks gestation of pregnancy: Secondary | ICD-10-CM

## 2020-07-16 DIAGNOSIS — O24419 Gestational diabetes mellitus in pregnancy, unspecified control: Secondary | ICD-10-CM

## 2020-07-16 DIAGNOSIS — Z363 Encounter for antenatal screening for malformations: Secondary | ICD-10-CM | POA: Diagnosis not present

## 2020-07-16 DIAGNOSIS — O24319 Unspecified pre-existing diabetes mellitus in pregnancy, unspecified trimester: Secondary | ICD-10-CM

## 2020-07-16 DIAGNOSIS — O0992 Supervision of high risk pregnancy, unspecified, second trimester: Secondary | ICD-10-CM | POA: Diagnosis not present

## 2020-07-16 MED ORDER — METFORMIN HCL 500 MG PO TABS: 500.0000 mg | ORAL_TABLET | Freq: Two times a day (BID) | ORAL | 4 refills | Status: DC

## 2020-07-16 NOTE — Progress Notes (Signed)
 HIGH-RISK PREGNANCY VISIT Patient name: Madison Mcgrath MRN [id number]  Date of birth: 11/27/1990 Chief Complaint:   Routine Prenatal Visit  History of Present Illness:   Madison Mcgrath is a 30 y.o. G1P0000 female at [redacted]w[redacted]d with an Estimated Date of Delivery: 12/12/20 being seen today for ongoing management of a high-risk pregnancy complicated by:  -cHTN On Labetalol 100mg bid, well controlled ASA 162mg daily  -Class B DM Reviewed sugar log- all sugars elevates fastings mostly high 110s and 2hr postprandial 140-160.  Occasional sugars within range  -Morbid obesity  -Hypothyroid. -synthroid 88mcg daily -last check 06/18/2020, [] recheck with 26wk lab work  Today she reports  breast tenderness- no masses or discharge .   Contractions: Not present. Vag. Bleeding: None.  Movement: Present. denies leaking of fluid.   Depression screen PHQ 2/9 07/08/2020 06/18/2020  Decreased Interest 0 0  Down, Depressed, Hopeless 0 1  PHQ - 2 Score 0 1  Altered sleeping - 0  Tired, decreased energy - 1  Change in appetite - 1  Feeling bad or failure about yourself  - 0  Trouble concentrating - 0  Moving slowly or fidgety/restless - 0  Suicidal thoughts - 0  PHQ-9 Score - 3     Current Outpatient Medications  Medication Instructions   acetaminophen (TYLENOL) 650 mg, Oral, Every 6 hours PRN   aspirin 162 mg, Oral, Daily, Swallow whole.   Blood Glucose Monitoring Suppl (ONETOUCH VERIO) w/Device KIT Use as directed to check blood sugar 4 times daily   Doxylamine-Pyridoxine (DICLEGIS) 10-10 MG TBEC 2 tabs q hs, if sx persist add 1 tab q am on day 3, if sx persist add 1 tab q afternoon on day 4   famotidine (PEPCID) 40 mg, Oral, Daily   glucose blood test strip Use as instructed   labetalol (NORMODYNE) 100 mg, Oral, 2 times daily   Lancets (ONETOUCH DELICA PLUS LANCET30G) MISC Use to check blood sugar 4 times daily   levothyroxine (SYNTHROID) 88 mcg, Oral, Every morning   metroNIDAZOLE (FLAGYL) 500 mg,  Oral, 2 times daily   ondansetron (ZOFRAN ODT) 4 mg, Oral, Every 8 hours PRN   Prenatal Vit-Fe Fumarate-FA (PRENATAL VITAMIN PO) 1 tablet, Oral, Every morning     Review of Systems:   Pertinent items are noted in HPI Denies abnormal vaginal discharge w/ itching/odor/irritation, headaches, visual changes, shortness of breath, chest pain, abdominal pain, severe nausea/vomiting, or problems with urination or bowel movements unless otherwise stated above. Pertinent History Reviewed:  Reviewed past medical,surgical, social, obstetrical and family history.  Reviewed problem list, medications and allergies. Physical Assessment:   Vitals:   07/16/20 1154  BP: 128/68  Pulse: 94  Weight: (!) 354 lb (160.6 kg)  Body mass index is 58.91 kg/m.           Physical Examination:   General appearance: alert, well appearing, and in no distress  Mental status: alert, oriented to person, place, and time  Skin: warm & dry   Extremities: Edema: None    Cardiovascular: normal heart rate noted  Respiratory: normal respiratory effort, no distress  Abdomen: gravid, soft, non-tender  Pelvic: Cervical exam deferred         Fetal Status: Fetal Heart Rate (bpm): 164 by US   Movement: Present    Fetal Surveillance Testing today: anatomy scan   Chaperone: N/A    No results found for this or any previous visit (from the past 24 hour(s)).   Assessment & Plan:    High-risk pregnancy: G1P0000 at 40w5dwith an Estimated Date of Delivery: 12/12/20   1) cHTN On Labetalol 1043mbid, well controlled ASA 16223maily  2)Class B DM -reviewed diet changes including getting rid of sugary drinks -plan to start metformin daily then increase to twice daily -continue sugar log, recheck in 2wks  -Morbid obesity Reviewed weight management, weight loss ok in this setting as she has improved her diet -discussed importance of healthy baby/healthy mom  -Hypothyroid. -synthroid 18m33maily -last check 06/18/2020, []  recheck with 26wk lab work  Meds: No orders of the defined types were placed in this encounter.   Labs/procedures today: Anatomy scan- incomplete, f/u in 4wks, ECHO to be scheduled  Treatment Plan:  Follow up in 2wks  Reviewed: Preterm labor symptoms and general obstetric precautions including but not limited to vaginal bleeding, contractions, leaking of fluid and fetal movement were reviewed in detail with the patient.  All questions were answered. Pt has home bp cuff. Check bp weekly, let us kKoreaw if >140/90.   Follow-up: Return in about 2 weeks (around 07/30/2020) for [place].   Future Appointments  Date Time Provider [place]6/2022 10:20 AM GrayNoreene Larsson [facility]7/2022 10:50 AM CresVevelyn PatanJoaquim LaiM [facility]    Orders Placed This Encounter  Procedures   INTEGRATED 2    JennJanyth Pupa Attending [facility]

## 2020-07-16 NOTE — Progress Notes (Signed)
[place] 18+5 wks,breech,posterior placenta gr 0,normal right ovary,left ovary not visualized,cx 3.2 cm,svp of fluid 4 cm,fhr 164 bpm,EFW 288 g 80%,limited view because of pt body habitus and fetal position,please have pt come back for a repeat anatomy scan

## 2020-07-16 NOTE — Patient Instructions (Signed)
Please start metformin- take one pill daily for one week then increase to one pill twice daily (breakfast and dinner)

## 2020-07-22 ENCOUNTER — Other Ambulatory Visit: Payer: Self-pay | Admitting: *Deleted

## 2020-07-22 DIAGNOSIS — O24419 Gestational diabetes mellitus in pregnancy, unspecified control: Secondary | ICD-10-CM

## 2020-07-22 DIAGNOSIS — O0992 Supervision of high risk pregnancy, unspecified, second trimester: Secondary | ICD-10-CM

## 2020-07-22 LAB — INTEGRATED 2
AFP MoM: 1.69
Alpha-Fetoprotein: 31.1 ng/mL
Crown Rump Length: 61.6 mm
DIA MoM: 0.79
DIA Value: 78.6 pg/mL
Estriol, Unconjugated: 2.19 ng/mL
Gest. Age on Collection Date: 12.4 weeks
Gestational Age: 18.4 weeks
Maternal Age at EDD: 32.9 yr
Nuchal Translucency (NT): 1.4 mm
Nuchal Translucency MoM: 1.02
Number of Fetuses: 1
PAPP-A MoM: 0.36
PAPP-A Value: 106.9 ng/mL
Test Results:: NEGATIVE
Weight: 360 [lb_av]
Weight: 360 [lb_av]
hCG MoM: 1.93
hCG Value: 21.7 IU/mL
uE3 MoM: 2.04

## 2020-07-29 ENCOUNTER — Encounter: Payer: Self-pay | Admitting: Nurse Practitioner

## 2020-07-29 ENCOUNTER — Other Ambulatory Visit: Payer: Self-pay

## 2020-07-29 ENCOUNTER — Ambulatory Visit (INDEPENDENT_AMBULATORY_CARE_PROVIDER_SITE_OTHER): Payer: 59 | Admitting: Nurse Practitioner

## 2020-07-29 VITALS — BP 129/85 | HR 110 | Temp 98.8°F | Ht 65.0 in | Wt 350.0 lb

## 2020-07-29 DIAGNOSIS — O10919 Unspecified pre-existing hypertension complicating pregnancy, unspecified trimester: Secondary | ICD-10-CM | POA: Diagnosis not present

## 2020-07-29 DIAGNOSIS — Z7689 Persons encountering health services in other specified circumstances: Secondary | ICD-10-CM | POA: Diagnosis not present

## 2020-07-29 DIAGNOSIS — R059 Cough, unspecified: Secondary | ICD-10-CM

## 2020-07-29 DIAGNOSIS — Z6841 Body Mass Index (BMI) 40.0 and over, adult: Secondary | ICD-10-CM

## 2020-07-29 DIAGNOSIS — E039 Hypothyroidism, unspecified: Secondary | ICD-10-CM | POA: Diagnosis not present

## 2020-07-29 DIAGNOSIS — O24419 Gestational diabetes mellitus in pregnancy, unspecified control: Secondary | ICD-10-CM | POA: Diagnosis not present

## 2020-07-29 DIAGNOSIS — O0992 Supervision of high risk pregnancy, unspecified, second trimester: Secondary | ICD-10-CM

## 2020-07-29 HISTORY — DX: Persons encountering health services in other specified circumstances: Z76.89

## 2020-07-29 NOTE — Patient Instructions (Signed)
Please have fasting labs drawn 2-3 days prior to your appointment so we can discuss the results during your office visit.  

## 2020-07-29 NOTE — Assessment & Plan Note (Signed)
BMI Readings from Last 3 Encounters:  07/29/20 58.24 kg/m  07/16/20 58.91 kg/m  06/18/20 60.57 kg/m   -lost 14 pounds since pregnancy started, well done! -will discuss medical weight management after delivery

## 2020-07-29 NOTE — Assessment & Plan Note (Addendum)
-  followed by GYN; due in mid-November 2022

## 2020-07-29 NOTE — Assessment & Plan Note (Signed)
-  likely d/t GERD given it is worse when she lies down -she will contact GYN for this; currently takes famotidine, and she will take this as often as prescribed; take tums per GYN recommendation -if no improvement after treating GERD, she should reach out to GYN

## 2020-07-29 NOTE — Progress Notes (Signed)
New Patient Office Visit  Subjective:  Patient ID: Madison Mcgrath, female    DOB: 02-Jan-1991  Age: 30 y.o. MRN: [id number]  CC:  Chief Complaint  Patient presents with   New Patient (Initial Visit)    Here to establish care. Complains of cough/vomiting that has been going on for 3 months-since becoming pregnant. Worse when lying down.     HPI Madison Mcgrath presents for new patient visit. Transferring care from Wende Neighbors, MD. Last physical was about a year ago. Last labs were drawn in May 2022.  Pt is pregnant. She is followed by [facility] for high-risk pregnancy d/t HTN, DM, morbid obesity, and hypothyroidism. Due date 12/12/20. She states she has gestational diabetes.  Today, she states that she is having a cough that is worse when lying down, and she coughs so much that she vomits. She has tried doxylamine-pyridoxine as well as zofran, but that isn't helping. She has also been taking famotidine in case this is a GERD-related cough.  She states that she has hx of bilateral knee instability. She states that 2 months ago was her last episode of knee instability. She states that her patellas rotate out of place, and her knees flex, and she has to manually put them back in place.  Past Medical History:  Diagnosis Date   Anxiety    Depression    GERD (gastroesophageal reflux disease)    Hypertension    Thyroid disease    Vaginal Pap smear, abnormal     Past Surgical History:  Procedure Laterality Date   CHOLECYSTECTOMY     LEG SURGERY Left    stick removed from leg    Family History  Problem Relation Age of Onset   High blood pressure Mother    Osteoporosis Mother    Dementia Mother    Cancer Maternal Grandmother    Diabetes Maternal Grandfather    Colon cancer Paternal Grandmother    Kidney failure Paternal Grandmother    Diabetes Maternal Uncle     Social History   Socioeconomic History   Marital status: Single    Spouse name: Not on file   Number of children:  Not on file   Years of education: Not on file   Highest education level: Not on file  Occupational History   Not on file  Tobacco Use   Smoking status: Never   Smokeless tobacco: Never  Vaping Use   Vaping Use: Former  Substance and Sexual Activity   Alcohol use: Not Currently   Drug use: Never   Sexual activity: Yes    Birth control/protection: None  Other Topics Concern   Not on file  Social History Narrative   Not on file   Social Determinants of Health   Financial Resource Strain: Low Risk    Difficulty of Paying Living Expenses: Not hard at all  Food Insecurity: No Food Insecurity   Worried About Charity fundraiser in the Last Year: Never true   Ran Out of Food in the Last Year: Never true  Transportation Needs: No Transportation Needs   Lack of Transportation (Medical): No   Lack of Transportation (Non-Medical): No  Physical Activity: Inactive   Days of Exercise per Week: 0 days   Minutes of Exercise per Session: 0 min  Stress: No Stress Concern Present   Feeling of Stress : Only a little  Social Connections: Moderately Isolated   Frequency of Communication with Friends and Family: More than three times  a week   Frequency of Social Gatherings with Friends and Family: More than three times a week   Attends Religious Services: Never   Marine scientist or Organizations: No   Attends Music therapist: Never   Marital Status: Living with partner  Intimate Partner Violence: Not At Risk   Fear of Current or Ex-Partner: No   Emotionally Abused: No   Physically Abused: No   Sexually Abused: No    ROS Review of Systems  Constitutional: Negative.   Respiratory:  Positive for cough.   Cardiovascular: Negative.   Gastrointestinal:        Indigestion/ burning in back of throat  Psychiatric/Behavioral: Negative.     Objective:   Today's Vitals: BP 129/85 (BP Location: Left Arm, Patient Position: Sitting, Cuff Size: Large)   Pulse (!) 110   Temp  98.8 F (37.1 C) (Temporal)   Ht 5' 5"  (1.651 m)   Wt (!) 350 lb (158.8 kg)   LMP  (LMP Unknown)   SpO2 98%   BMI 58.24 kg/m   Physical Exam Constitutional:      Appearance: Normal appearance. She is obese.  Cardiovascular:     Rate and Rhythm: Normal rate and regular rhythm.     Pulses: Normal pulses.     Heart sounds: Normal heart sounds.  Pulmonary:     Effort: Pulmonary effort is normal.     Breath sounds: Normal breath sounds.  Abdominal:     Comments: Palpation deferred d/t pregnancy  Neurological:     Mental Status: She is alert.  Psychiatric:        Mood and Affect: Mood normal.        Behavior: Behavior normal.        Thought Content: Thought content normal.        Judgment: Judgment normal.    Assessment & Plan:   Problem List Items Addressed This Visit   None   Outpatient Encounter Medications as of 07/29/2020  Medication Sig   acetaminophen (TYLENOL) 325 MG tablet Take 650 mg by mouth every 6 (six) hours as needed.   aspirin 81 MG EC tablet Take 2 tablets (162 mg total) by mouth daily. Swallow whole.   Blood Glucose Monitoring Suppl (ONETOUCH VERIO) w/Device KIT Use as directed to check blood sugar 4 times daily   Doxylamine-Pyridoxine (DICLEGIS) 10-10 MG TBEC 2 tabs q hs, if sx persist add 1 tab q am on day 3, if sx persist add 1 tab q afternoon on day 4 (Patient not taking: Reported on 07/16/2020)   famotidine (PEPCID) 40 MG tablet Take 40 mg by mouth daily.   glucose blood test strip Use as instructed   labetalol (NORMODYNE) 100 MG tablet Take 1 tablet (100 mg total) by mouth 2 (two) times daily. (Patient taking differently: Take 100 mg by mouth daily.)   Lancets (ONETOUCH DELICA PLUS EHMCNO70J) MISC Use to check blood sugar 4 times daily   levothyroxine (SYNTHROID) 88 MCG tablet Take 88 mcg by mouth every morning.   metFORMIN (GLUCOPHAGE) 500 MG tablet Take 1 tablet (500 mg total) by mouth 2 (two) times daily with a meal.   metroNIDAZOLE (FLAGYL) 500 MG  tablet Take 1 tablet (500 mg total) by mouth 2 (two) times daily. (Patient not taking: Reported on 07/16/2020)   ondansetron (ZOFRAN ODT) 4 MG disintegrating tablet Take 1 tablet (4 mg total) by mouth every 8 (eight) hours as needed for nausea or vomiting. (Patient not taking: Reported on  07/16/2020)   Prenatal Vit-Fe Fumarate-FA (PRENATAL VITAMIN PO) Take 1 tablet by mouth every morning.   No facility-administered encounter medications on file as of 07/29/2020.    Follow-up: No follow-ups on file.   Noreene Larsson, NP

## 2020-07-29 NOTE — Assessment & Plan Note (Signed)
-  will check labs after delivery; currently monitored by GYN

## 2020-07-29 NOTE — Assessment & Plan Note (Signed)
-  she states that she has bene taking her labetalol once per day instead of BID -BP looks good today

## 2020-07-29 NOTE — Assessment & Plan Note (Signed)
-  obtain records 

## 2020-07-29 NOTE — Assessment & Plan Note (Signed)
-  followed by GYN 

## 2020-07-30 ENCOUNTER — Ambulatory Visit (INDEPENDENT_AMBULATORY_CARE_PROVIDER_SITE_OTHER): Payer: 59 | Admitting: Advanced Practice Midwife

## 2020-07-30 VITALS — BP 138/88 | HR 103 | Wt 352.4 lb

## 2020-07-30 DIAGNOSIS — O10919 Unspecified pre-existing hypertension complicating pregnancy, unspecified trimester: Secondary | ICD-10-CM

## 2020-07-30 DIAGNOSIS — O0992 Supervision of high risk pregnancy, unspecified, second trimester: Secondary | ICD-10-CM

## 2020-07-30 DIAGNOSIS — Z1389 Encounter for screening for other disorder: Secondary | ICD-10-CM

## 2020-07-30 DIAGNOSIS — Z3A20 20 weeks gestation of pregnancy: Secondary | ICD-10-CM

## 2020-07-30 LAB — POCT URINALYSIS DIPSTICK OB
Blood, UA: NEGATIVE
Glucose, UA: NEGATIVE
Ketones, UA: NEGATIVE
Leukocytes, UA: NEGATIVE
Nitrite, UA: NEGATIVE

## 2020-07-30 MED ORDER — OMEPRAZOLE MAGNESIUM 20 MG PO TBEC: 20.0000 mg | DELAYED_RELEASE_TABLET | Freq: Every day | ORAL | 6 refills | Status: DC

## 2020-07-30 NOTE — Patient Instructions (Signed)
Madison Mcgrath, I greatly value your feedback.  If you receive a survey following your visit with [place] today, we appreciate you taking the time to fill it out.  Thanks, Cathie Beams, CNM     [facility] HAS MOVED!!! It is now [facility] at [facility] ([address] [place], [place] [address]) Entrance located off of [address] Free 24/7 valet parking   Go to [email or web address] to register for FREE online childbirth classes    Second Trimester of Pregnancy The second trimester is from week 14 through week 27 (months 4 through 6). The second trimester is often a time when you feel your best. Your body has adjusted to being pregnant, and you begin to feel better physically. Usually, morning sickness has lessened or quit completely, you may have more energy, and you may have an increase in appetite. The second trimester is also a time when the fetus is growing rapidly. At the end of the sixth month, the fetus is about 9 inches long and weighs about 1 pounds. You will likely begin to feel the baby move (quickening) between 16 and 20 weeks of pregnancy. Body changes during your second trimester Your body continues to go through many changes during your second trimester. The changes vary from woman to woman. Your weight will continue to increase. You will notice your lower abdomen bulging out. You may begin to get stretch marks on your hips, abdomen, and breasts. You may develop headaches that can be relieved by medicines. The medicines should be approved by your health care provider. You may urinate more often because the fetus is pressing on your bladder. You may develop or continue to have heartburn as a result of your pregnancy. You may develop constipation because certain hormones are causing the muscles that push waste through your intestines to slow down. You may develop hemorrhoids or swollen, bulging veins (varicose veins). You may have back pain. This is  caused by: Weight gain. Pregnancy hormones that are relaxing the joints in your pelvis. A shift in weight and the muscles that support your balance. Your breasts will continue to grow and they will continue to become tender. Your gums may bleed and may be sensitive to brushing and flossing. Dark spots or blotches (chloasma, mask of pregnancy) may develop on your face. This will likely fade after the baby is born. A dark line from your belly button to the pubic area (linea nigra) may appear. This will likely fade after the baby is born. You may have changes in your hair. These can include thickening of your hair, rapid growth, and changes in texture. Some women also have hair loss during or after pregnancy, or hair that feels dry or thin. Your hair will most likely return to normal after your baby is born.  What to expect at prenatal visits During a routine prenatal visit: You will be weighed to make sure you and the fetus are growing normally. Your blood pressure will be taken. Your abdomen will be measured to track your baby's growth. The fetal heartbeat will be listened to. Any test results from the previous visit will be discussed.  Your health care provider may ask you: How you are feeling. If you are feeling the baby move. If you have had any abnormal symptoms, such as leaking fluid, bleeding, severe headaches, or abdominal cramping. If you are using any tobacco products, including cigarettes, chewing tobacco, and electronic cigarettes. If you have any questions.  Other tests  that may be performed during your second trimester include: Blood tests that check for: Low iron levels (anemia). High blood sugar that affects pregnant women (gestational diabetes) between 54 and 28 weeks. Rh antibodies. This is to check for a protein on red blood cells (Rh factor). Urine tests to check for infections, diabetes, or protein in the urine. An ultrasound to confirm the proper growth and  development of the baby. An amniocentesis to check for possible genetic problems. Fetal screens for spina bifida and Down syndrome. HIV (human immunodeficiency virus) testing. Routine prenatal testing includes screening for HIV, unless you choose not to have this test.  Follow these instructions at home: Medicines Follow your health care provider's instructions regarding medicine use. Specific medicines may be either safe or unsafe to take during pregnancy. Take a prenatal vitamin that contains at least 600 micrograms (mcg) of folic acid. If you develop constipation, try taking a stool softener if your health care provider approves. Eating and drinking Eat a balanced diet that includes fresh fruits and vegetables, whole grains, good sources of protein such as meat, eggs, or tofu, and low-fat dairy. Your health care provider will help you determine the amount of weight gain that is right for you. Avoid raw meat and uncooked cheese. These carry germs that can cause birth defects in the baby. If you have low calcium intake from food, talk to your health care provider about whether you should take a daily calcium supplement. Limit foods that are high in fat and processed sugars, such as fried and sweet foods. To prevent constipation: Drink enough fluid to keep your urine clear or pale yellow. Eat foods that are high in fiber, such as fresh fruits and vegetables, whole grains, and beans. Activity Exercise only as directed by your health care provider. Most women can continue their usual exercise routine during pregnancy. Try to exercise for 30 minutes at least 5 days a week. Stop exercising if you experience uterine contractions. Avoid heavy lifting, wear low heel shoes, and practice good posture. A sexual relationship may be continued unless your health care provider directs you otherwise. Relieving pain and discomfort Wear a good support bra to prevent discomfort from breast tenderness. Take  warm sitz baths to soothe any pain or discomfort caused by hemorrhoids. Use hemorrhoid cream if your health care provider approves. Rest with your legs elevated if you have leg cramps or low back pain. If you develop varicose veins, wear support hose. Elevate your feet for 15 minutes, 3-4 times a day. Limit salt in your diet. Prenatal Care Write down your questions. Take them to your prenatal visits. Keep all your prenatal visits as told by your health care provider. This is important. Safety Wear your seat belt at all times when driving. Make a list of emergency phone numbers, including numbers for family, friends, the hospital, and police and fire departments. General instructions Ask your health care provider for a referral to a local prenatal education class. Begin classes no later than the beginning of month 6 of your pregnancy. Ask for help if you have counseling or nutritional needs during pregnancy. Your health care provider can offer advice or refer you to specialists for help with various needs. Do not use hot tubs, steam rooms, or saunas. Do not douche or use tampons or scented sanitary pads. Do not cross your legs for long periods of time. Avoid cat litter boxes and soil used by cats. These carry germs that can cause birth defects in the baby  and possibly loss of the fetus by miscarriage or stillbirth. Avoid all smoking, herbs, alcohol, and unprescribed drugs. Chemicals in these products can affect the formation and growth of the baby. Do not use any products that contain nicotine or tobacco, such as cigarettes and e-cigarettes. If you need help quitting, ask your health care provider. Visit your dentist if you have not gone yet during your pregnancy. Use a soft toothbrush to brush your teeth and be gentle when you floss. Contact a health care provider if: You have dizziness. You have mild pelvic cramps, pelvic pressure, or nagging pain in the abdominal area. You have persistent  nausea, vomiting, or diarrhea. You have a bad smelling vaginal discharge. You have pain when you urinate. Get help right away if: You have a fever. You are leaking fluid from your vagina. You have spotting or bleeding from your vagina. You have severe abdominal cramping or pain. You have rapid weight gain or weight loss. You have shortness of breath with chest pain. You notice sudden or extreme swelling of your face, hands, ankles, feet, or legs. You have not felt your baby move in over an hour. You have severe headaches that do not go away when you take medicine. You have vision changes. Summary The second trimester is from week 14 through week 27 (months 4 through 6). It is also a time when the fetus is growing rapidly. Your body goes through many changes during pregnancy. The changes vary from woman to woman. Avoid all smoking, herbs, alcohol, and unprescribed drugs. These chemicals affect the formation and growth your baby. Do not use any tobacco products, such as cigarettes, chewing tobacco, and e-cigarettes. If you need help quitting, ask your health care provider. Contact your health care provider if you have any questions. Keep all prenatal visits as told by your health care provider. This is important. This information is not intended to replace advice given to you by your health care provider. Make sure you discuss any questions you have with your health care provider.

## 2020-07-30 NOTE — Progress Notes (Signed)
HIGH-RISK PREGNANCY VISIT Patient name: Madison Mcgrath MRN [id number]  Date of birth: 1990-05-31 Chief Complaint:   Routine Prenatal Visit and High Risk Gestation  History of Present Illness:   Madison Mcgrath is a 30 y.o. G65P0000 female at [redacted]w[redacted]d with an Estimated Date of Delivery: 12/12/20 being seen today for ongoing management of a high-risk pregnancy complicated by  Class B DM, just started on Metformin BID; CHTN on labetalol and ASA   Established care w/PCP yesterday, labs ordered.  Lipids will probably be elevated d/t pregnancy.  Today she reports FBS 99-117 and 2 hour pp 118-147, most out of range..Only taking metformin in the am (wans't sure when to increase to BID)  Contractions: Not present. Vag. Bleeding: None.  Movement: Present. denies leaking of fluid. Has echo appt scheduled for 7/20. Review of Systems:   Pertinent items are noted in HPI Denies abnormal vaginal discharge w/ itching/odor/irritation, headaches, visual changes, shortness of breath, chest pain, abdominal pain, severe nausea/vomiting, or problems with urination or bowel movements unless otherwise stated above. Pertinent History Reviewed:  Reviewed past medical,surgical, social, obstetrical and family history.  Reviewed problem list, medications and allergies. Physical Assessment:   Vitals:   07/30/20 1038  BP: 138/88  Pulse: (!) 103  Weight: (!) 352 lb 6.4 oz (159.8 kg)  Body mass index is 58.64 kg/m.           Physical Examination:   General appearance: alert, well appearing, and in no distress  Mental status: alert, oriented to person, place, and time  Skin: warm & dry   Extremities: Edema: None    Cardiovascular: normal heart rate noted  Respiratory: normal respiratory effort, no distress  Abdomen: gravid, soft, non-tender  Pelvic: Cervical exam deferred         Fetal Status:     Movement: Present    Fetal Surveillance Testing today:bedside US + FCA ~ 150  Results for orders placed or performed in visit  on 07/30/20 (from the past 24 hour(s))  POC Urinalysis Dipstick OB   Collection Time: 07/30/20 10:40 AM  Result Value Ref Range   Color, UA     Clarity, UA     Glucose, UA Negative Negative   Bilirubin, UA     Ketones, UA neg    Spec Grav, UA     Blood, UA neg    pH, UA     POC,PROTEIN,UA Trace Negative, Trace, Small (1+), Moderate (2+), Large (3+), 4+   Urobilinogen, UA     Nitrite, UA neg    Leukocytes, UA Negative Negative   Appearance     Odor      Assessment & Plan:  1) High-risk pregnancy G1P0000 at [redacted]w[redacted]d with an Estimated Date of Delivery: 12/12/20   2) CHTN, stable Treatment plan  ASA 162mg  [ x  labetalol 100mg  BID         Growth u/s q 4wks    2x/wk testing nst/sono @ 32wks     Deliver 38-39wks (37wks or prn if poor control)____   3) Class B DM, unstable  Treatment plan QID BS, testing per [facility] guidelines.  Increase metformin to 500mg  BID (was once a day).   Meds:  Meds ordered this encounter  Medications   omeprazole (PRILOSEC OTC) 20 MG tablet    Sig: Take 1 tablet (20 mg total) by mouth daily.    Dispense:  30 tablet    Refill:  6    Order Specific Question:   Supervising Provider  Answer:   Lazaro Arms [[id number]]     Labs/procedures today:    Reviewed: Preterm labor symptoms and general obstetric precautions including but not limited to vaginal bleeding, contractions, leaking of fluid and fetal movement were reviewed in detail with the patient.  All questions were answered. Has home bp cuff.. Check bp weekly, let us know if >140/90.   Follow-up: Return for 1 week mychart visit to go over BS; 2-3 weeks HROB/efw.  Future Appointments  Date Time Provider Department Center  01/29/2021 10:00 AM Heather Roberts, NP [facility]    Orders Placed This Encounter  Procedures   POC Urinalysis Dipstick OB   Jacklyn Shell DNP, CNM 07/30/2020 11:23 AM

## 2020-08-05 ENCOUNTER — Encounter: Payer: Self-pay | Admitting: Obstetrics & Gynecology

## 2020-08-05 ENCOUNTER — Telehealth (INDEPENDENT_AMBULATORY_CARE_PROVIDER_SITE_OTHER): Payer: 59 | Admitting: Obstetrics & Gynecology

## 2020-08-05 VITALS — BP 132/71 | HR 93

## 2020-08-05 DIAGNOSIS — E039 Hypothyroidism, unspecified: Secondary | ICD-10-CM

## 2020-08-05 DIAGNOSIS — O0992 Supervision of high risk pregnancy, unspecified, second trimester: Secondary | ICD-10-CM

## 2020-08-05 DIAGNOSIS — O10919 Unspecified pre-existing hypertension complicating pregnancy, unspecified trimester: Secondary | ICD-10-CM

## 2020-08-05 DIAGNOSIS — O99282 Endocrine, nutritional and metabolic diseases complicating pregnancy, second trimester: Secondary | ICD-10-CM

## 2020-08-05 DIAGNOSIS — O24415 Gestational diabetes mellitus in pregnancy, controlled by oral hypoglycemic drugs: Secondary | ICD-10-CM

## 2020-08-05 DIAGNOSIS — Z3A21 21 weeks gestation of pregnancy: Secondary | ICD-10-CM

## 2020-08-05 DIAGNOSIS — O10912 Unspecified pre-existing hypertension complicating pregnancy, second trimester: Secondary | ICD-10-CM

## 2020-08-05 NOTE — Progress Notes (Signed)
   OBSTETRICS PRENATAL VIRTUAL VISIT ENCOUNTER NOTE  [facility]   Patient location: Home  I connected with Madison Mcgrath on 08/05/20 at 10:50 AM EDT by MyChart Video Encounter and verified that I am speaking with the correct person using two identifiers. I discussed the limitations, risks, security and privacy concerns of performing an evaluation and management service virtually and the availability of in person appointments. I also discussed with the patient that there may be a patient responsible charge related to this service. The patient expressed understanding and agreed to proceed. Subjective:  Madison Mcgrath is a 30 y.o. G1P0000 at [redacted]w[redacted]d being seen today for ongoing prenatal care.  She is currently monitored for the following issues for this high-risk pregnancy:  cHTN- on Labetalol 100mg  bid GDMA2- metformin 500mg  bid -pt occasionally forgets to check sugars and take her pm medication -Fasting 95-113 -Postprandial- 101-123  Morbid obesity Hypothyroidism  Patient reports no complaints.  Contractions: Not present. Vag. Bleeding: None.  Movement: Present. Denies any leaking of fluid.   The following portions of the patient's history were reviewed and updated as appropriate: allergies, current medications, past family history, past medical history, past social history, past surgical history and problem list.   Objective:   Vitals:   08/05/20 1119  BP: 132/71  Pulse: 93    Fetal Status:     Movement: Present     General:  Alert, oriented and cooperative. Patient is in no acute distress.  Respiratory: Normal respiratory effort, no problems with respiration noted  Mental Status: Normal mood and affect. Normal behavior. Normal judgment and thought content.  Rest of physical exam deferred due to type of encounter  Imaging: Anatomy scan 6/23 Assessment and Plan:  Pregnancy: G1P0000 at [redacted]w[redacted]d 1. Supervision of high risk pregnancy in  second trimester  -GDMA2- doing ok with metformin bid- encouraged pt to take medication regularly  Repeat anatomy scan 7/27 scheduled  ECHO already scheduled  []  continue growth scan q 4wks -cHTN- BP within normal range  On ASA daily  Continue Labetalol  []  @ 32wks- Antepartum testing- NST/BPP twice weekly -Hypothyroidism  []  recheck TSH q trimester  Preterm labor symptoms and general obstetric precautions including but not limited to vaginal bleeding, contractions, leaking of fluid and fetal movement were reviewed in detail with the patient. I discussed the assessment and treatment plan with the patient. The patient was provided an opportunity to ask questions and all were answered. The patient agreed with the plan and demonstrated an understanding of the instructions. The patient was advised to call back or seek an in-person office evaluation/go to [facility] at [facility] for any urgent or concerning symptoms. Please refer to After Visit Summary for other counseling recommendations.   I provided 15 minutes of face-to-face time during this encounter.  No follow-ups on file.  Future Appointments  Date Time Provider Department Center  08/19/2020  2:30 PM [facility] None  08/19/2020  3:10 PM [facility], DO [facility]  01/29/2021 10:00 AM [facility], NP [facility]    [place], DO [facility] for [facility], [facility]

## 2020-08-06 ENCOUNTER — Telehealth: Payer: 59 | Admitting: Advanced Practice Midwife

## 2020-08-18 ENCOUNTER — Other Ambulatory Visit: Payer: Self-pay | Admitting: Obstetrics & Gynecology

## 2020-08-18 DIAGNOSIS — Z362 Encounter for other antenatal screening follow-up: Secondary | ICD-10-CM

## 2020-08-19 ENCOUNTER — Ambulatory Visit (INDEPENDENT_AMBULATORY_CARE_PROVIDER_SITE_OTHER): Payer: 59

## 2020-08-19 ENCOUNTER — Other Ambulatory Visit: Payer: Self-pay

## 2020-08-19 ENCOUNTER — Encounter: Payer: Self-pay | Admitting: Obstetrics & Gynecology

## 2020-08-19 ENCOUNTER — Ambulatory Visit (INDEPENDENT_AMBULATORY_CARE_PROVIDER_SITE_OTHER): Payer: 59 | Admitting: Obstetrics & Gynecology

## 2020-08-19 VITALS — BP 131/72 | HR 95 | Wt 346.8 lb

## 2020-08-19 DIAGNOSIS — O0992 Supervision of high risk pregnancy, unspecified, second trimester: Secondary | ICD-10-CM

## 2020-08-19 DIAGNOSIS — O10919 Unspecified pre-existing hypertension complicating pregnancy, unspecified trimester: Secondary | ICD-10-CM

## 2020-08-19 DIAGNOSIS — Z362 Encounter for other antenatal screening follow-up: Secondary | ICD-10-CM | POA: Diagnosis not present

## 2020-08-19 DIAGNOSIS — Z3A23 23 weeks gestation of pregnancy: Secondary | ICD-10-CM | POA: Diagnosis not present

## 2020-08-19 DIAGNOSIS — O24419 Gestational diabetes mellitus in pregnancy, unspecified control: Secondary | ICD-10-CM | POA: Diagnosis not present

## 2020-08-19 DIAGNOSIS — O162 Unspecified maternal hypertension, second trimester: Secondary | ICD-10-CM

## 2020-08-19 DIAGNOSIS — K219 Gastro-esophageal reflux disease without esophagitis: Secondary | ICD-10-CM

## 2020-08-19 LAB — POCT URINALYSIS DIPSTICK OB
Blood, UA: NEGATIVE
Glucose, UA: NEGATIVE
Ketones, UA: NEGATIVE
Leukocytes, UA: NEGATIVE
Nitrite, UA: NEGATIVE
POC,PROTEIN,UA: NEGATIVE

## 2020-08-19 MED ORDER — FAMOTIDINE 20 MG PO TABS: 20.0000 mg | ORAL_TABLET | Freq: Two times a day (BID) | ORAL | 11 refills | Status: DC

## 2020-08-19 NOTE — Progress Notes (Addendum)
[place] 23+4 wks,cephalic,posterior placenta gr 0,svp of fluid 4 cm,fhr 157 bpm,EFW 591 g 34%,anatomy complete,no obvious abnormalities,limited ultrasound because of pt body habitus

## 2020-08-19 NOTE — Progress Notes (Signed)
HIGH-RISK PREGNANCY VISIT Patient name: Madison Mcgrath MRN [id number]  Date of birth: [birth year] Chief Complaint:   Routine Prenatal Visit (U/S)  History of Present Illness:   Madison Mcgrath is a 30 y.o. G31P0000 female at 39w4dwith an Estimated Date of Delivery: 12/12/20 being seen today for ongoing management of a high-risk pregnancy complicated by: -Chronic HTN- Lab 103mbid ASA 16241mialy  -GDMA2- metformin 500m28md.   Pt forgot log- she reports: Fasting per pt 99-110 2hr postprandial: 90-120 per patient  Today she reports heartburn- unable to get omeprazole due to cost.  Notes coughing and on occasion vomiting due to the reflux.  Contractions: Not present. Vag. Bleeding: None.  Movement: Present. denies leaking of fluid.   Depression screen PHQ [facility] 07/29/2020 07/08/2020 06/18/2020  Decreased Interest 0 0 0  Down, Depressed, Hopeless 1 0 1  PHQ - 2 Score 1 0 1  Altered sleeping - - 0  Tired, decreased energy - - 1  Change in appetite - - 1  Feeling bad or failure about yourself  - - 0  Trouble concentrating - - 0  Moving slowly or fidgety/restless - - 0  Suicidal thoughts - - 0  PHQ-9 Score - - 3     Current Outpatient Medications  Medication Instructions   aspirin 162 mg, Oral, Daily, Swallow whole.   Blood Glucose Monitoring Suppl (ONETOUCH VERIO) w/Device KIT Use as directed to check blood sugar 4 times daily   famotidine (PEPCID) 20 mg, Oral, 2 times daily   glucose blood test strip Use as instructed   labetalol (NORMODYNE) 100 mg, Oral, 2 times daily   lamoTRIgine (LAMICTAL) 100 mg, Oral, Daily   Lancets (ONETOUCH DELICA PLUS [id number] Use to check blood sugar 4 times daily   levothyroxine (SYNTHROID) 88 mcg, Oral, Every morning   metFORMIN (GLUCOPHAGE) 500 mg, Oral, 2 times daily with meals   omeprazole (PRILOSEC OTC) 20 mg, Oral, Daily   Prenatal Vit-Fe Fumarate-FA (PRENATAL VITAMIN PO) 1 tablet, Oral, Every morning     Review of Systems:   Pertinent items  are noted in HPI Denies abnormal vaginal discharge w/ itching/odor/irritation, headaches, visual changes, shortness of breath, chest pain, abdominal pain or problems with urination or bowel movements unless otherwise stated above. Pertinent History Reviewed:  Reviewed past medical,surgical, social, obstetrical and family history.  Reviewed problem list, medications and allergies. Physical Assessment:   Vitals:   08/19/20 1515  BP: 131/72  Pulse: 95  Weight: (!) 346 lb 12.8 oz (157.3 kg)  Body mass index is 57.71 kg/m.           Physical Examination:   General appearance: alert, well appearing, and in no distress  Mental status: alert, oriented to person, place, and time  Skin: warm & dry   Extremities: Edema: None    Cardiovascular: normal heart rate noted  Respiratory: normal respiratory effort, no distress  Abdomen: gravid, soft, non-tender  Pelvic: Cervical exam deferred         Fetal Status: Fetal Heart Rate (bpm): 157 by US  Koreaovement: Present Presentation: Vertex  Fetal Surveillance Testing today: ultrasound US 2Korea462+5,cephalic,posterior placenta gr 0,svp of fluid 4 cm,fhr 157 bpm,EFW 591 g 34%,anatomy complete,no obvious abnormalities,limited ultrasound because of pt body habitus    Chaperone: N/A    Results for orders placed or performed in visit on 08/19/20 (from the past 24 hour(s))  POC Urinalysis Dipstick OB   Collection Time: 08/19/20  3:21 PM  Result Value Ref Range   Color, UA     Clarity, UA     Glucose, UA Negative Negative   Bilirubin, UA     Ketones, UA neg    Spec Grav, UA     Blood, UA neg    pH, UA     POC,PROTEIN,UA Negative Negative, Trace, Small (1+), Moderate (2+), Large (3+), 4+   Urobilinogen, UA     Nitrite, UA neg    Leukocytes, UA Negative Negative   Appearance     Odor       Assessment & Plan:  High-risk pregnancy: G1P0000 at 62w4dwith an Estimated Date of Delivery: 12/12/20   -GDMA2- Strongly encouraged pt to bring logs For  now continue with current plan- discussed ways to help lower fasting Congratulated pt on weight loss and reassured pt that this can be expected due to her lifestyle/diet changes Discussed potential for NPH at night (prefer pen not shot)            Anatomy today, normal, AGA             ECHO completed and has f/u in August             _0  continue growth scan q 4wks -cHTN- BP within normal range             On ASA daily             Continue Labetalol             _1  @ 32wks- Antepartum testing- NST/BPP twice weekly -Hypothyroidism             _2  recheck TSH q trimester- to be done at next visit -Acid Reflux  If possible obtain OTC omeprazole  Rx for Pepcid sent in as well  Meds:  Meds ordered this encounter  Medications   famotidine (PEPCID) 20 MG tablet    Sig: Take 1 tablet (20 mg total) by mouth 2 (two) times daily.    Dispense:  60 tablet    Refill:  11    Labs/procedures today: ultrasound   Treatment Plan:  as outlined above  Reviewed: Preterm labor symptoms and general obstetric precautions including but not limited to vaginal bleeding, contractions, leaking of fluid and fetal movement were reviewed in detail with the patient.  All questions were answered. Pt has home bp cuff. Check bp weekly, let uKoreaknow if >140/90.   Follow-up: Return in about 4 weeks (around 09/16/2020) for HROB visit and growth scan.   Future Appointments  Date Time Provider DHensley 09/16/2020 11:00 AM [facility] - FTOBGYN [place]CWH-FTIMG None  09/16/2020 11:50 AM CChristin Fudge CNM [facility]-FT FTOBGYN  01/29/2021 10:00 AM GNoreene Larsson NP [facility]-RPC RPC    Orders Placed This Encounter  Procedures   POC Urinalysis Dipstick OB    JJanyth Pupa DO Attending [place] [facility]for [facility] [place]

## 2020-08-20 ENCOUNTER — Telehealth: Payer: Self-pay | Admitting: Women's Health

## 2020-08-20 NOTE — Telephone Encounter (Signed)
Patient states she has a fever, just took some Tylenol but vomited and is unsure if the medication came back up.  She is laying down on the couch and is short of breath.  Advised if she was having SOB, then would need to go to the hospital for assessment.  Pt verbalized understanding.
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
## 2020-08-22 ENCOUNTER — Emergency Department (HOSPITAL_COMMUNITY)
Admission: EM | Admit: 2020-08-22 | Discharge: 2020-08-23 | Disposition: A | Discharge status: Home / Self Care | Payer: 59 | Attending: Emergency Medicine | Admitting: Emergency Medicine

## 2020-08-22 ENCOUNTER — Other Ambulatory Visit: Payer: Self-pay

## 2020-08-22 ENCOUNTER — Encounter (HOSPITAL_COMMUNITY): Payer: Self-pay | Admitting: Emergency Medicine

## 2020-08-22 DIAGNOSIS — O132 Gestational [pregnancy-induced] hypertension without significant proteinuria, second trimester: Secondary | ICD-10-CM | POA: Insufficient documentation

## 2020-08-22 DIAGNOSIS — Z3A24 24 weeks gestation of pregnancy: Secondary | ICD-10-CM | POA: Insufficient documentation

## 2020-08-22 DIAGNOSIS — Z7982 Long term (current) use of aspirin: Secondary | ICD-10-CM | POA: Diagnosis not present

## 2020-08-22 DIAGNOSIS — O9A212 Injury, poisoning and certain other consequences of external causes complicating pregnancy, second trimester: Secondary | ICD-10-CM | POA: Diagnosis present

## 2020-08-22 DIAGNOSIS — S0083XA Contusion of other part of head, initial encounter: Secondary | ICD-10-CM | POA: Diagnosis not present

## 2020-08-22 DIAGNOSIS — O162 Unspecified maternal hypertension, second trimester: Secondary | ICD-10-CM

## 2020-08-22 DIAGNOSIS — E039 Hypothyroidism, unspecified: Secondary | ICD-10-CM | POA: Diagnosis not present

## 2020-08-22 DIAGNOSIS — Y92009 Unspecified place in unspecified non-institutional (private) residence as the place of occurrence of the external cause: Secondary | ICD-10-CM | POA: Insufficient documentation

## 2020-08-22 DIAGNOSIS — Z79899 Other long term (current) drug therapy: Secondary | ICD-10-CM | POA: Insufficient documentation

## 2020-08-22 DIAGNOSIS — Z3492 Encounter for supervision of normal pregnancy, unspecified, second trimester: Secondary | ICD-10-CM

## 2020-08-22 IMAGING — DX RIGHT SHOULDER - 2+ VIEW
3 series · 3 of 3 positions shown · non-contrast
Comparison: None.

CLINICAL DATA: 28-year-old female with right shoulder pain.

EXAM:
RIGHT SHOULDER - 2+ VIEW

[shoulder grashey]
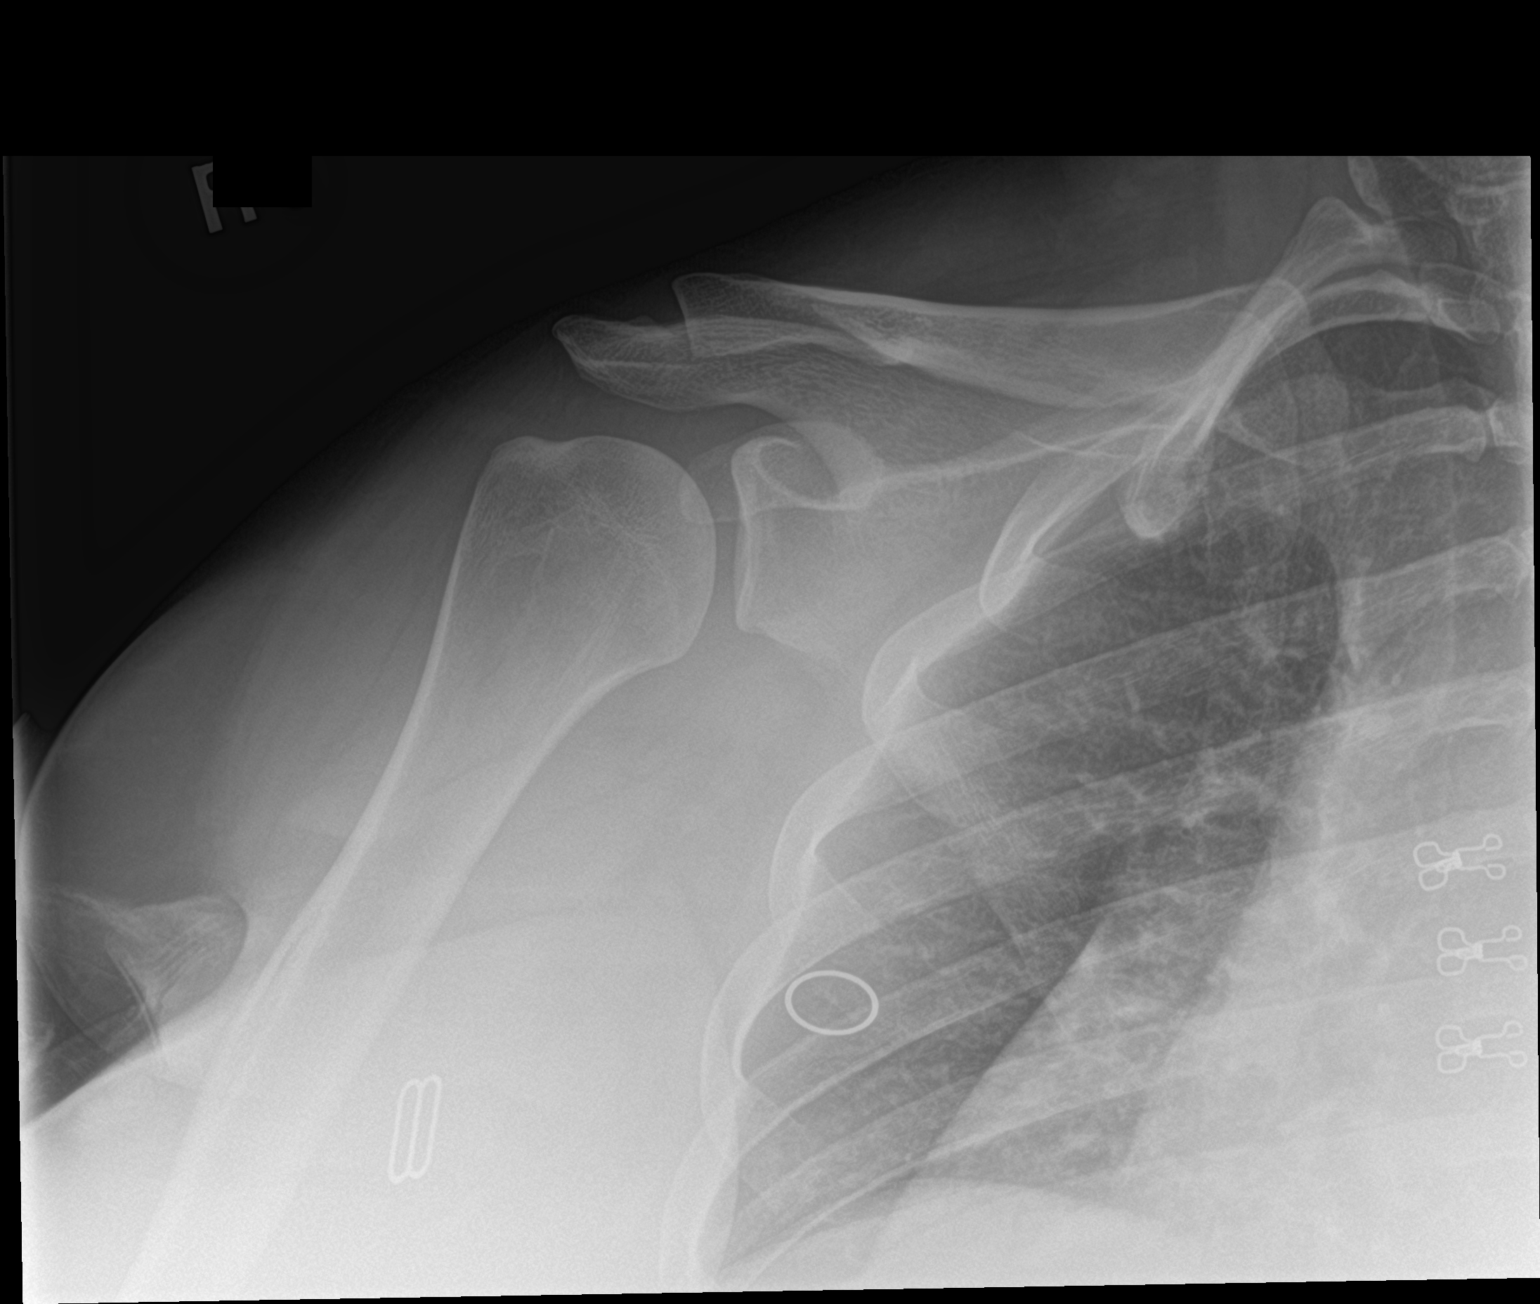

[shoulder y view]
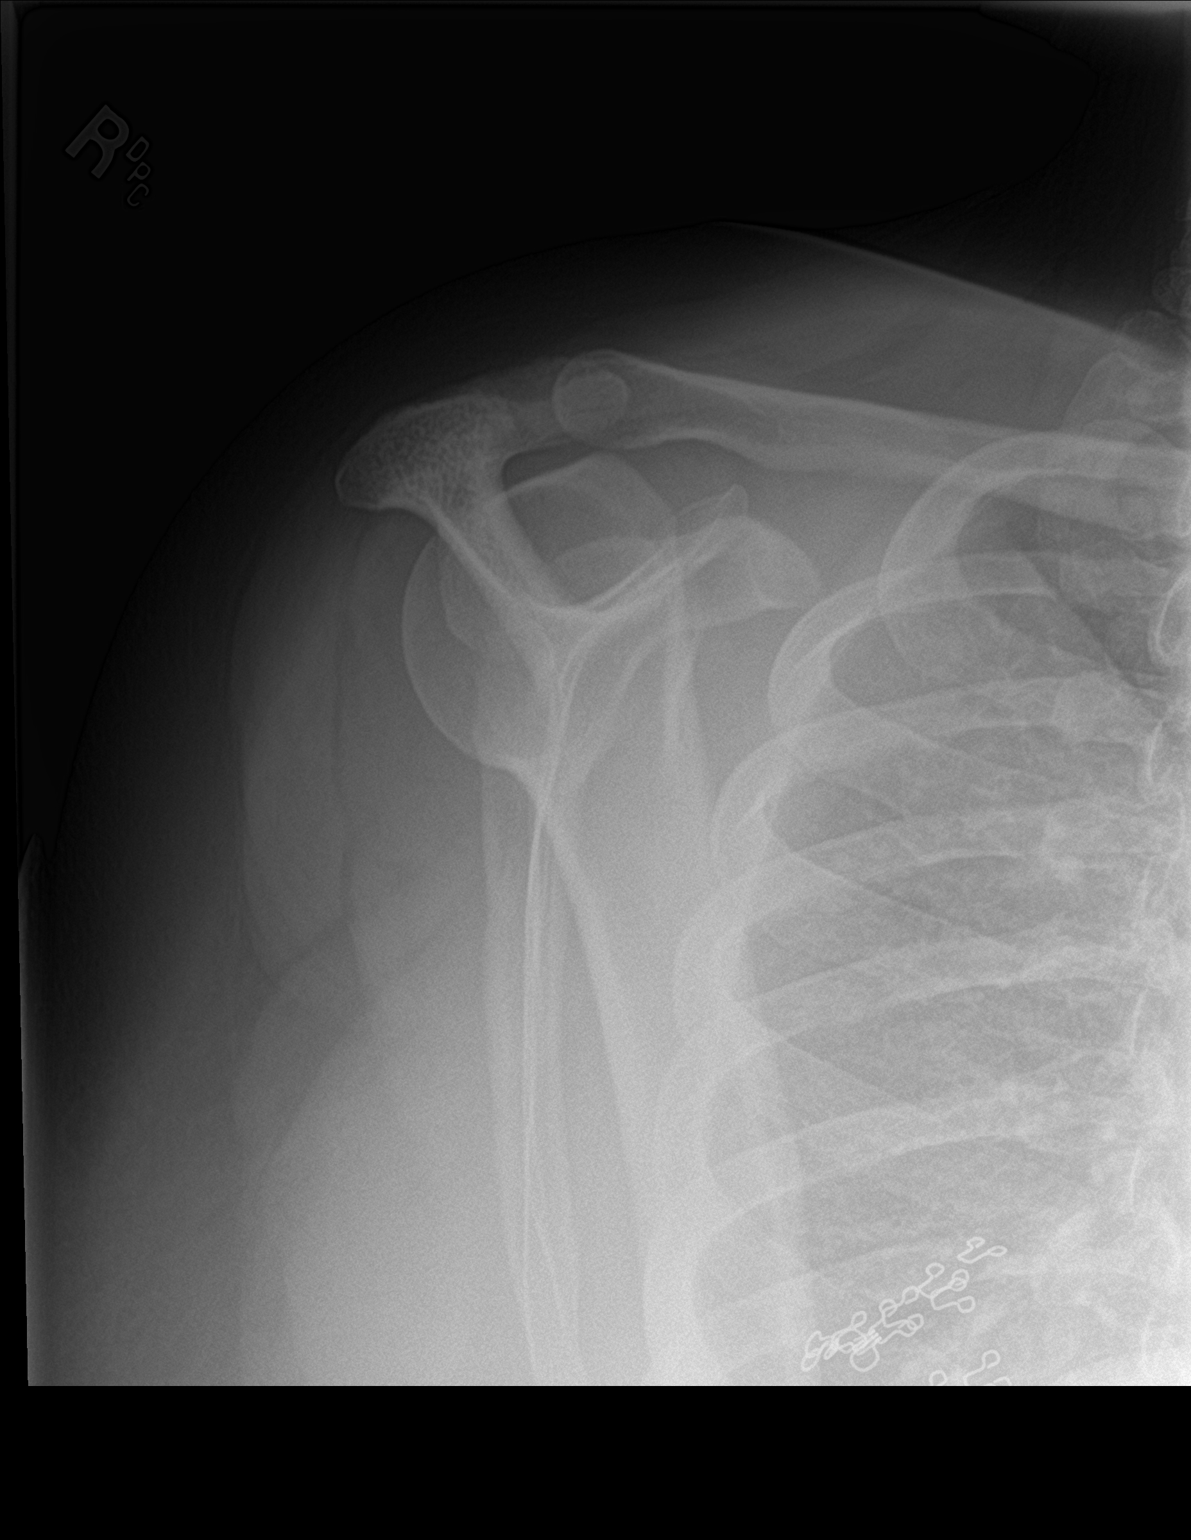

[shoulder axillary]
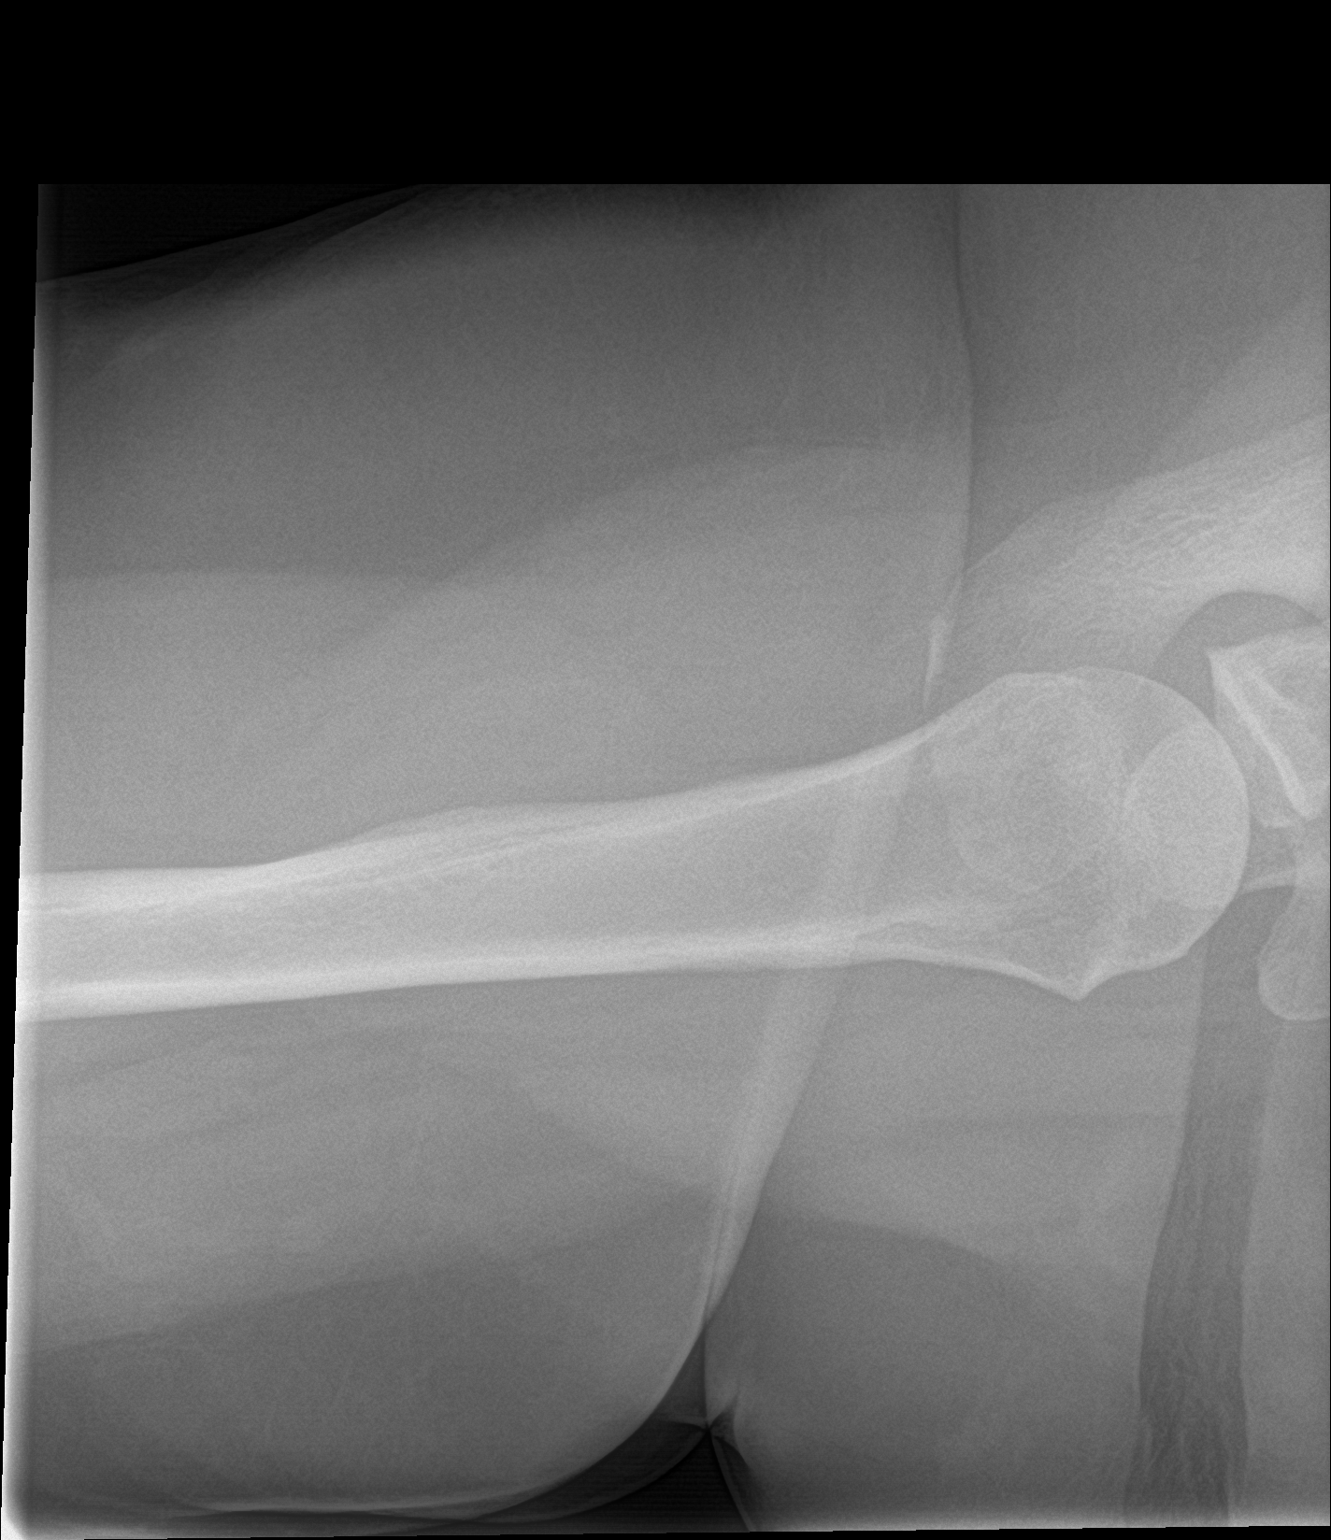

[3 of 3 positions shown; findings below may reference images not displayed]

FINDINGS: There is no evidence of fracture or dislocation. There is no
evidence of arthropathy or other focal bone abnormality. Soft
tissues are unremarkable.
IMPRESSION: Negative.
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
## 2020-08-23 DIAGNOSIS — O9A212 Injury, poisoning and certain other consequences of external causes complicating pregnancy, second trimester: Secondary | ICD-10-CM | POA: Diagnosis not present
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
## 2020-08-23 NOTE — ED Notes (Signed)
Placed on toco and fetal monitoring. Phone call placed to OB rapid response nurse. See OB note.

## 2020-08-23 NOTE — Discharge Instructions (Addendum)
Apply ice to sore areas.  Ice can be applied for 30 minutes at a time, 4 times a day.  Take acetaminophen as needed for pain.  Do not take ibuprofen or naproxen because they can harm your baby.  Your blood pressure was high tonight.  Please see your obstetrician to have your blood pressure rechecked.  High blood pressure during pregnancy can harm you and harm the baby.

## 2020-08-23 NOTE — ED Notes (Signed)
FHR 159

## 2020-08-24 ENCOUNTER — Other Ambulatory Visit: Payer: Self-pay

## 2020-08-24 ENCOUNTER — Encounter: Payer: 59 | Admitting: Obstetrics & Gynecology

## 2020-09-10 ENCOUNTER — Other Ambulatory Visit: Payer: Self-pay | Admitting: Women's Health

## 2020-09-10 MED ORDER — AMOXICILLIN 500 MG PO CAPS: 500.0000 mg | ORAL_CAPSULE | Freq: Two times a day (BID) | ORAL | 0 refills | Status: DC

## 2020-09-11 ENCOUNTER — Other Ambulatory Visit: Payer: Self-pay | Admitting: Adult Health

## 2020-09-15 ENCOUNTER — Other Ambulatory Visit: Payer: Self-pay | Admitting: Obstetrics & Gynecology

## 2020-09-15 DIAGNOSIS — O10919 Unspecified pre-existing hypertension complicating pregnancy, unspecified trimester: Secondary | ICD-10-CM

## 2020-09-16 ENCOUNTER — Ambulatory Visit (INDEPENDENT_AMBULATORY_CARE_PROVIDER_SITE_OTHER): Payer: 59

## 2020-09-16 ENCOUNTER — Other Ambulatory Visit: Payer: Self-pay

## 2020-09-16 ENCOUNTER — Encounter: Payer: Self-pay | Admitting: Advanced Practice Midwife

## 2020-09-16 ENCOUNTER — Ambulatory Visit: Payer: 59 | Admitting: Advanced Practice Midwife

## 2020-09-16 VITALS — BP 132/89 | HR 102 | Wt 338.8 lb

## 2020-09-16 DIAGNOSIS — O10919 Unspecified pre-existing hypertension complicating pregnancy, unspecified trimester: Secondary | ICD-10-CM | POA: Diagnosis not present

## 2020-09-16 DIAGNOSIS — O0993 Supervision of high risk pregnancy, unspecified, third trimester: Secondary | ICD-10-CM

## 2020-09-16 DIAGNOSIS — Z3A27 27 weeks gestation of pregnancy: Secondary | ICD-10-CM | POA: Diagnosis not present

## 2020-09-16 LAB — POCT URINALYSIS DIPSTICK OB
Blood, UA: NEGATIVE
Glucose, UA: NEGATIVE
Ketones, UA: NEGATIVE
Leukocytes, UA: NEGATIVE
Nitrite, UA: NEGATIVE

## 2020-09-16 NOTE — Progress Notes (Signed)
Korea 27+4 wks,cephalic,fhr 146 bpm,posterior placenta gr 0,cx 2.8 cm,afi 12.6 cm,EFW 1135 g 48%,limited view because of pt body habitus

## 2020-09-17 ENCOUNTER — Other Ambulatory Visit: Payer: Self-pay | Admitting: Advanced Practice Midwife

## 2020-09-24 ENCOUNTER — Ambulatory Visit (INDEPENDENT_AMBULATORY_CARE_PROVIDER_SITE_OTHER): Payer: 59 | Admitting: Obstetrics & Gynecology

## 2020-09-24 ENCOUNTER — Other Ambulatory Visit: Payer: Self-pay

## 2020-09-24 ENCOUNTER — Other Ambulatory Visit (HOSPITAL_COMMUNITY)
Admission: RE | Admit: 2020-09-24 | Discharge: 2020-09-24 | Disposition: A | Discharge status: Home / Self Care | Payer: 59 | Source: Ambulatory Visit | Attending: Advanced Practice Midwife | Admitting: Advanced Practice Midwife

## 2020-09-24 VITALS — BP 139/88 | HR 104 | Wt 338.0 lb

## 2020-09-24 DIAGNOSIS — N898 Other specified noninflammatory disorders of vagina: Secondary | ICD-10-CM

## 2020-09-24 DIAGNOSIS — O0993 Supervision of high risk pregnancy, unspecified, third trimester: Secondary | ICD-10-CM

## 2020-09-24 NOTE — Progress Notes (Signed)
HIGH-RISK PREGNANCY VISIT Patient name: Madison Mcgrath MRN [id number]  Date of birth: 06-Apr-1990 Chief Complaint:   Routine Prenatal Visit  History of Present Illness:   Madison Mcgrath is a 30 y.o. G61P0000 female at 42w5dwith an Estimated Date of Delivery: 12/12/20 being seen today for ongoing management of a high-risk pregnancy complicated by:  1) cHTN- on Labetalol 1074mbid 2) T2DM- on metformin 50046mm, 1000m27m Sugars reviewed- fasting 90-98, 2hr PP- 110-119 3) Morbid obesity- BMI 60 4) Hypothryoidism- on 88mc19mily.    Today she reports vaginal irritation. Notes for the past 1-2 wks increased discharge and itching both internal and external.  She has not tried any OTC medication.  No urinary symptoms   Contractions: Not present. Vag. Bleeding: None.  Movement: Present. denies leaking of fluid.   Depression screen PHQ [facility]7/06/2020 07/08/2020 06/18/2020  Decreased Interest 0 0 0  Down, Depressed, Hopeless 1 0 1  PHQ - 2 Score 1 0 1  Altered sleeping - - 0  Tired, decreased energy - - 1  Change in appetite - - 1  Feeling bad or failure about yourself  - - 0  Trouble concentrating - - 0  Moving slowly or fidgety/restless - - 0  Suicidal thoughts - - 0  PHQ-9 Score - - 3     Current Outpatient Medications  Medication Instructions   aspirin 162 mg, Oral, Daily, Swallow whole.   Blood Glucose Monitoring Suppl (ONETOUCH VERIO) w/Device KIT Use as directed to check blood sugar 4 times daily   famotidine (PEPCID) 20 mg, Oral, 2 times daily   glucose blood test strip Use as instructed   labetalol (NORMODYNE) 100 MG tablet TAKE 1 TABLET(100 MG) BY MOUTH TWICE DAILY   lamoTRIgine (LAMICTAL) 100 mg, Oral, Daily   Lancets (ONETOUCH DELICA PLUS [id number] Use to check blood sugar 4 times daily   levothyroxine (SYNTHROID) 88 mcg, Oral, Every morning   metFORMIN (GLUCOPHAGE) 500 mg, Oral, 2 times daily with meals   omeprazole (PRILOSEC OTC) 20 mg, Oral, Daily   Prenatal Vit-Fe  Fumarate-FA (PRENATAL VITAMIN PO) 1 tablet, Oral, Every morning     Review of Systems:   Pertinent items are noted in HPI Denies headaches, visual changes, shortness of breath, chest pain, abdominal pain, severe nausea/vomiting, or problems with urination or bowel movements unless otherwise stated above. Pertinent History Reviewed:  Reviewed past medical,surgical, social, obstetrical and family history.  Reviewed problem list, medications and allergies. Physical Assessment:   Vitals:   09/24/20 1058  BP: 139/88  Pulse: (!) 104  Weight: (!) 338 lb (153.3 kg)  Body mass index is 56.25 kg/m.           Physical Examination:   General appearance: alert, well appearing, and in no distress  Mental status: alert, oriented to person, place, and time  Skin: warm & dry   Extremities: Edema: None    Cardiovascular: normal heart rate noted  Respiratory: normal respiratory effort, no distress  Abdomen: gravid, soft, non-tender  Pelvic: Cervical exam performed       Minimal white discharge noted, cervix appears closed  Fetal Status: Fetal Heart Rate (bpm): 150   Movement: Present    Fetal Surveillance Testing today: doppler   Chaperone: AmandCelene Squibbo results found for this or any previous visit (from the past 24 hour(s)).   Assessment & Plan:  High-risk pregnancy: G1P0000 at 63w5d60w5dan Estimated Date of Delivery: 12/12/20  1) cHTN- doing well with Labetalol 177m bid 2) T2DM- sugars well controlled, plan to continue with current regimen 3) Morbid obesity- no wt gain since her last visit 4) Hypothryoidism- on 841m daily, - last checked 7/6, []  3rd trimester blood work  5) Vulvar/vaginal itching -plan to r/o underlying infection, further management pending result -reviewed conservative management  Meds: No orders of the defined types were placed in this encounter.   Labs/procedures today: doppler, last growth scan 8/24  Treatment Plan:  Start NST/BPP testing @ 32wks,  next growth around 9/24  Reviewed: Preterm labor symptoms and general obstetric precautions including but not limited to vaginal bleeding, contractions, leaking of fluid and fetal movement were reviewed in detail with the patient.  All questions were answered. Pt has home bp cuff. Check bp weekly, let usKoreanow if >140/90.   Follow-up: Return in about 2 weeks (around 10/08/2020) for HRVarnadoisit, in 3wks starting NST/BPP weekly.   Future Appointments  Date Time Provider [place]9/15/2022 10:30 AM CrChristin FudgeCNM [facility]-FT FTOBGYN  01/29/2021 10:00 AM GrNoreene LarssonNP RPC-RPC RPC    No orders of the defined types were placed in this encounter.   JeJanyth PupaDO Attending ObNesconsetFaYellowstone Surgery Center LLCor WoDean Foods CompanyCoSt. Paul

## 2020-09-30 ENCOUNTER — Other Ambulatory Visit: Payer: Self-pay | Admitting: Obstetrics & Gynecology

## 2020-09-30 ENCOUNTER — Telehealth: Payer: Self-pay | Admitting: Nurse Practitioner

## 2020-09-30 LAB — CERVICOVAGINAL ANCILLARY ONLY
Bacterial Vaginitis (gardnerella): NEGATIVE
Candida Glabrata: NEGATIVE
Candida Vaginitis: POSITIVE — AB
Chlamydia: NEGATIVE
Comment: NEGATIVE
Comment: NEGATIVE
Comment: NEGATIVE
Comment: NEGATIVE
Comment: NEGATIVE
Comment: NORMAL
Neisseria Gonorrhea: NEGATIVE
Trichomonas: NEGATIVE

## 2020-09-30 MED ORDER — TERCONAZOLE 0.4 % VA CREA
1.0000 | TOPICAL_CREAM | Freq: Every day | VAGINAL | 1 refills | Status: AC
Start: 2020-09-30 — End: 2020-10-07

## 2020-09-30 NOTE — Progress Notes (Signed)
Rx sent in for terazol due to yeast infection

## 2020-09-30 NOTE — Telephone Encounter (Signed)
Left message for patient to call back and schedule Medicare Annual Wellness Visit (AWV) in office.   If unable to come into the office for AWV,  please offer to do virtually or by telephone.  Last AWV: 06/24/2013 awvs per [place]  Please schedule at anytime with RPC-Nurse Health Advisor.  40 minute appointment  Any questions, please contact me at [phone]

## 2020-10-08 ENCOUNTER — Ambulatory Visit (INDEPENDENT_AMBULATORY_CARE_PROVIDER_SITE_OTHER): Payer: 59 | Admitting: Advanced Practice Midwife

## 2020-10-08 ENCOUNTER — Other Ambulatory Visit: Payer: Self-pay

## 2020-10-08 VITALS — BP 123/68 | HR 99 | Wt 336.0 lb

## 2020-10-08 DIAGNOSIS — E039 Hypothyroidism, unspecified: Secondary | ICD-10-CM

## 2020-10-08 DIAGNOSIS — Z3A30 30 weeks gestation of pregnancy: Secondary | ICD-10-CM

## 2020-10-08 DIAGNOSIS — O24415 Gestational diabetes mellitus in pregnancy, controlled by oral hypoglycemic drugs: Secondary | ICD-10-CM

## 2020-10-08 DIAGNOSIS — O10919 Unspecified pre-existing hypertension complicating pregnancy, unspecified trimester: Secondary | ICD-10-CM

## 2020-10-08 DIAGNOSIS — O0993 Supervision of high risk pregnancy, unspecified, third trimester: Secondary | ICD-10-CM

## 2020-10-08 LAB — POCT URINALYSIS DIPSTICK OB
Blood, UA: NEGATIVE
Glucose, UA: NEGATIVE
Leukocytes, UA: NEGATIVE
Nitrite, UA: NEGATIVE

## 2020-10-08 NOTE — Progress Notes (Signed)
HIGH-RISK PREGNANCY VISIT Patient name: Madison Mcgrath MRN [id number]  Date of birth: [birth year] Chief Complaint:   Routine Prenatal Visit  History of Present Illness:   Madison Mcgrath is a 30 y.o. 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  Check TSH today Today she reports no complaints. Contractions: Not present. Vag. Bleeding: None.  Movement: Present. denies leaking of fluid.  Review of Systems:   Pertinent items are noted in HPI Denies abnormal vaginal discharge w/ itching/odor/irritation, headaches, visual changes, shortness of breath, chest pain, abdominal pain, severe nausea/vomiting, or problems with urination or bowel movements unless otherwise stated above. Pertinent History Reviewed:  Reviewed past medical,surgical, social, obstetrical and family history.  Reviewed problem list, medications and allergies. Physical Assessment:   Vitals:   10/08/20 1004  BP: 123/68  Pulse: 99  Weight: (!) 336 lb (152.4 kg)  Body mass index is 55.91 kg/m.           Physical Examination:   General appearance: alert, well appearing, and in no distress  Mental status: alert, oriented to person, place, and time  Skin: warm & dry   Extremities: Edema: None    Cardiovascular: normal heart rate noted  Respiratory: normal respiratory effort, no distress  Abdomen: gravid, soft, non-tender  Pelvic: Cervical exam deferred         Fetal Status:     Movement: Present    Fetal Surveillance Testing today: none; unable to doppler, bedside shows FHR around 150   Results for orders placed or performed in visit on 10/08/20 (from the past 24 hour(s))  POC Urinalysis Dipstick OB    Collection Time: 10/08/20 10:09 AM  Result Value Ref Range   Color, UA     Clarity, UA     Glucose, UA Negative Negative   Bilirubin, UA     Ketones, UA small    Spec Grav, UA     Blood, UA neg    pH, UA     POC,PROTEIN,UA Trace Negative, Trace, Small (1+), Moderate (2+), Large (3+), 4+   Urobilinogen, UA     Nitrite, UA neg    Leukocytes, UA Negative Negative   Appearance     Odor      Assessment & Plan:  1) High-risk pregnancy G1P0000 at [redacted]w[redacted]d with an Estimated Date of Delivery: 12/12/20   1) cHTN- doing well with Labetalol 100mg  bid 2) T2DM- sugars well controlled, plan to continue with current regimen 3) Morbid obesity- no wt gain since her last visit 4) Hypothryoidism- on 10/10/20 daily, - last checked 7/6, []  3rd trimester blood work today  Meds: No orders of the defined types were placed in this encounter.   Labs/procedures today: none   Reviewed: Preterm labor symptoms and general obstetric precautions including but not limited to vaginal bleeding, contractions, leaking of fluid and fetal movement were reviewed in detail with the patient.  All questions were answered. Has home bp cuff. Check bp weekly, let 12/14/20 know if >140/90.   Follow-up: Return for As scheduled.  Future Appointments  Date Time Provider Department Center  10/12/2020  2:30 PM [facility] NURSE [facility]  10/14/2020 11:00  AM [facility] NURSE ADVISOR [facility]  10/15/2020 11:15 AM [facility] [place] [facility] None  10/15/2020 12:10 PM Allayne Stack, DO [facility]  10/19/2020  2:30 PM [facility] NURSE [facility]  10/22/2020  3:00 PM [facility] [place] [facility] None  10/22/2020  3:50 PM Lazaro Arms, MD [facility]  10/26/2020  2:30 PM [facility] NURSE [facility]  10/29/2020  3:00 PM [facility] [place] [facility] None  10/29/2020  3:50 PM Cresenzo-Dishmon, Scarlette Calico, CNM [facility]  11/02/2020  2:30 PM [facility] NURSE [facility]  11/05/2020  3:00 PM [facility] [place] [facility] None   11/05/2020  3:50 PM Cheral Marker, CNM [facility]  11/09/2020 10:50 AM [facility] NURSE [facility]  11/12/2020 10:30 AM [facility] [place] [facility] None  11/12/2020 11:30 AM Myna Hidalgo, DO [facility]  11/16/2020  2:30 PM [facility] NURSE [facility]  11/19/2020  3:00 PM [facility] [place] [facility] None  11/19/2020  3:50 PM Myna Hidalgo, DO [facility]  11/26/2020  3:00 PM [facility] [place] [facility] None  11/26/2020  3:50 PM Lazaro Arms, MD [facility]  11/30/2020  2:30 PM [facility] NURSE [facility]  12/03/2020  3:00 PM [facility] [place] [facility] None  12/03/2020  3:50 PM Myna Hidalgo, DO [facility]  12/07/2020  2:30 PM [facility] NURSE [facility]  12/10/2020  3:00 PM [facility] [place] [facility] None  12/10/2020  3:50 PM Lazaro Arms, MD [facility]  01/29/2021 10:00 AM Heather Roberts, NP [facility]    Orders Placed This Encounter  Procedures  . CBC  . RPR  . HIV Antibody (routine testing w rflx)  . TSH  . POC Urinalysis Dipstick OB  . Antibody screen   Jacklyn Shell DNP, CNM 10/08/2020 8:01 PM

## 2020-10-08 NOTE — Patient Instructions (Addendum)
Patient Name: ________________________________________________ Patient Due Date: ____________________ What is a fetal movement count? (AKA KICK COUNT)  A fetal movement count  is the number of times that you feel your baby move during a certain amount of time. This may also be called a fetal kick count. A fetal movement count is recommended for every pregnant woman. You may be asked to start counting fetal movements as early as week 28 of your pregnancy. Pay attention to when your baby is most active. You may notice your baby's sleep and wake cycles. You may also notice things that make your baby move more. You should do a fetal movement count: When your baby is normally most active. At the same time each day. A good time to count movements is while you are resting, after having something to eat and drink. How do I count fetal movements? Find a quiet, comfortable area. Sit, or lie down on your side. Write down the date, the start time and stop time, and the number of movements that you felt between those two times. Take this information with you to your health care visits. Write down your start time when you feel the first movement. Count kicks, flutters, swishes, rolls, and jabs. You should feel at least 10 movements. You may stop counting after you have felt 10 movements, or if you have been counting for 2 hours. Write down the stop time. If you do not feel 10 movements in 2 hours, contact your health care provider for further instructions. Your health care provider may want to do additional tests to assess your baby's well-being. Contact a health care provider if: You feel fewer than 10 movements in 2 hours. Your baby is not moving like he or she usually does. Date: ____________ Start time: ____________ Stop time: ____________ Movements: ____________ Date: ____________ Start time: ____________ Stop time: ____________ Movements: ____________ Date: ____________ Start time: ____________  Stop time: ____________ Movements: ____________ Date: ____________ Start time: ____________ Stop time: ____________ Movements: ____________ Date: ____________ Start time: ____________ Stop time: ____________ Movements: ____________ Date: ____________ Start time: ____________ Stop time: ____________ Movements: ____________ Date: ____________ Start time: ____________ Stop time: ____________ Movements: ____________ Date: ____________ Start time: ____________ Stop time: ____________ Movements: ____________ Date: ____________ Start time: ____________ Stop time: ____________ Movements: ____________ This information is not intended to replace advice given to you by your health care provider. Make sure you discuss any questions you have with your health care provider. Document Revised: 08/30/2018 Document Reviewed: 08/30/2018 Elsevier Patient Education  2020 [employer]    Zyrah Marcellus Scott, I greatly value your feedback.  If you receive a survey following your visit with [place] today, we appreciate you taking the time to fill it out.  Thanks, Cathie Beams, CNM   [facility] HAS MOVED!!! It is now [facility] ([address] [place], [place] [address]) Entrance located off of [address] Free 24/7 valet parking   Go to [email or web address] to register for FREE online childbirth classes    Call the office [phone]) or go to [facility] if: You begin to have strong, frequent contractions Your water breaks.  Sometimes it is a big gush of fluid, sometimes it is just a trickle that keeps getting your panties wet or running down your legs You have vaginal bleeding.  It is normal to have a small amount of spotting if your cervix was checked.  You don't feel your baby moving like normal.  If you  don't, get you something to eat and drink and lay down and focus on feeling your baby move.  You should feel at least 10 movements in 2 hours.  If you don't, you should call the  office or go to [facility].    Tdap Vaccine It is recommended that you get the Tdap vaccine during the third trimester of EACH pregnancy to help protect your baby from getting pertussis (whooping cough) 27-36 weeks is the BEST time to do this so that you can pass the protection on to your baby. During pregnancy is better than after pregnancy, but if you are unable to get it during pregnancy it will be offered at the hospital.  You will be offered this vaccine in the office after 27 weeks. If you do not have health insurance, you can get this vaccine at the health department or your family doctor Everyone who will be around your baby should also be up-to-date on their vaccines. Adults (who are not pregnant) only need 1 dose of Tdap during adulthood.   Third Trimester of Pregnancy The third trimester is from week 29 through week 42, months 7 through 9. The third trimester is a time when the fetus is growing rapidly. At the end of the ninth month, the fetus is about 20 inches in length and weighs 6-10 pounds.  BODY CHANGES Your body goes through many changes during pregnancy. The changes vary from woman to woman.  Your weight will continue to increase. You can expect to gain 25-35 pounds (11-16 kg) by the end of the pregnancy. You may begin to get stretch marks on your hips, abdomen, and breasts. You may urinate more often because the fetus is moving lower into your pelvis and pressing on your bladder. You may develop or continue to have heartburn as a result of your pregnancy. You may develop constipation because certain hormones are causing the muscles that push waste through your intestines to slow down. You may develop hemorrhoids or swollen, bulging veins (varicose veins). You may have pelvic pain because of the weight gain and pregnancy hormones relaxing your joints between the bones in your pelvis. Backaches may result from overexertion of the muscles supporting your posture. You may  have changes in your hair. These can include thickening of your hair, rapid growth, and changes in texture. Some women also have hair loss during or after pregnancy, or hair that feels dry or thin. Your hair will most likely return to normal after your baby is born. Your breasts will continue to grow and be tender. A yellow discharge may leak from your breasts called colostrum. Your belly button may stick out. You may feel short of breath because of your expanding uterus. You may notice the fetus "dropping," or moving lower in your abdomen. You may have a bloody mucus discharge. This usually occurs a few days to a week before labor begins. Your cervix becomes thin and soft (effaced) near your due date. WHAT TO EXPECT AT YOUR PRENATAL EXAMS  You will have prenatal exams every 2 weeks until week 36. Then, you will have weekly prenatal exams. During a routine prenatal visit: You will be weighed to make sure you and the fetus are growing normally. Your blood pressure is taken. Your abdomen will be measured to track your baby's growth. The fetal heartbeat will be listened to. Any test results from the previous visit will be discussed. You may have a cervical check near your due date to see if you have  effaced. At around 36 weeks, your caregiver will check your cervix. At the same time, your caregiver will also perform a test on the secretions of the vaginal tissue. This test is to determine if a type of bacteria, Group B streptococcus, is present. Your caregiver will explain this further. Your caregiver may ask you: What your birth plan is. How you are feeling. If you are feeling the baby move. If you have had any abnormal symptoms, such as leaking fluid, bleeding, severe headaches, or abdominal cramping. If you have any questions. Other tests or screenings that may be performed during your third trimester include: Blood tests that check for low iron levels (anemia). Fetal testing to check the  health, activity level, and growth of the fetus. Testing is done if you have certain medical conditions or if there are problems during the pregnancy. FALSE LABOR You may feel small, irregular contractions that eventually go away. These are called Braxton Hicks contractions, or false labor. Contractions may last for hours, days, or even weeks before true labor sets in. If contractions come at regular intervals, intensify, or become painful, it is best to be seen by your caregiver.  SIGNS OF LABOR  Menstrual-like cramps. Contractions that are 5 minutes apart or less. Contractions that start on the top of the uterus and spread down to the lower abdomen and back. A sense of increased pelvic pressure or back pain. A watery or bloody mucus discharge that comes from the vagina. If you have any of these signs before the 37th week of pregnancy, call your caregiver right away. You need to go to the hospital to get checked immediately. HOME CARE INSTRUCTIONS  Avoid all smoking, herbs, alcohol, and unprescribed drugs. These chemicals affect the formation and growth of the baby. Follow your caregiver's instructions regarding medicine use. There are medicines that are either safe or unsafe to take during pregnancy. Exercise only as directed by your caregiver. Experiencing uterine cramps is a good sign to stop exercising. Continue to eat regular, healthy meals. Wear a good support bra for breast tenderness. Do not use hot tubs, steam rooms, or saunas. Wear your seat belt at all times when driving. Avoid raw meat, uncooked cheese, cat litter boxes, and soil used by cats. These carry germs that can cause birth defects in the baby. Take your prenatal vitamins. Try taking a stool softener (if your caregiver approves) if you develop constipation. Eat more high-fiber foods, such as fresh vegetables or fruit and whole grains. Drink plenty of fluids to keep your urine clear or pale yellow. Take warm sitz baths to  soothe any pain or discomfort caused by hemorrhoids. Use hemorrhoid cream if your caregiver approves. If you develop varicose veins, wear support hose. Elevate your feet for 15 minutes, 3-4 times a day. Limit salt in your diet. Avoid heavy lifting, wear low heal shoes, and practice good posture. Rest a lot with your legs elevated if you have leg cramps or low back pain. Visit your dentist if you have not gone during your pregnancy. Use a soft toothbrush to brush your teeth and be gentle when you floss. A sexual relationship may be continued unless your caregiver directs you otherwise. Do not travel far distances unless it is absolutely necessary and only with the approval of your caregiver. Take prenatal classes to understand, practice, and ask questions about the labor and delivery. Make a trial run to the hospital. Pack your hospital bag. Prepare the baby's nursery. Continue to go to all your  prenatal visits as directed by your caregiver. SEEK MEDICAL CARE IF: You are unsure if you are in labor or if your water has broken. You have dizziness. You have mild pelvic cramps, pelvic pressure, or nagging pain in your abdominal area. You have persistent nausea, vomiting, or diarrhea. You have a bad smelling vaginal discharge. You have pain with urination. SEEK IMMEDIATE MEDICAL CARE IF:  You have a fever. You are leaking fluid from your vagina. You have spotting or bleeding from your vagina. You have severe abdominal cramping or pain. You have rapid weight loss or gain. You have shortness of breath with chest pain. You notice sudden or extreme swelling of your face, hands, ankles, feet, or legs. You have not felt your baby move in over an hour. You have severe headaches that do not go away with medicine. You have vision changes. Document Released: 01/04/2001 Document Revised: 01/15/2013 Document Reviewed: 03/13/2012 [facility] Patient Information 2015 [place], [place]. This information is not  intended to replace advice given to you by your health care provider. Make sure you discuss any questions you have with your health care provider.

## 2020-10-09 LAB — CBC
Hematocrit: 38.4 % (ref 34.0–46.6)
Hemoglobin: 12.5 g/dL (ref 11.1–15.9)
MCH: 25.9 pg — ABNORMAL LOW (ref 26.6–33.0)
MCHC: 32.6 g/dL (ref 31.5–35.7)
MCV: 80 fL (ref 79–97)
Platelets: 277 10*3/uL (ref 150–450)
RBC: 4.83 x10E6/uL (ref 3.77–5.28)
RDW: 14.4 % (ref 11.7–15.4)
WBC: 11.2 10*3/uL — ABNORMAL HIGH (ref 3.4–10.8)

## 2020-10-09 LAB — TSH: TSH: 1.47 u[IU]/mL (ref 0.450–4.500)

## 2020-10-09 LAB — RPR: RPR Ser Ql: NONREACTIVE

## 2020-10-09 LAB — ANTIBODY SCREEN: Antibody Screen: NEGATIVE

## 2020-10-09 LAB — HIV ANTIBODY (ROUTINE TESTING W REFLEX): HIV Screen 4th Generation wRfx: NONREACTIVE

## 2020-10-12 ENCOUNTER — Other Ambulatory Visit: Payer: Self-pay

## 2020-10-12 ENCOUNTER — Ambulatory Visit (INDEPENDENT_AMBULATORY_CARE_PROVIDER_SITE_OTHER): Payer: 59 | Admitting: *Deleted

## 2020-10-12 VITALS — BP 134/74 | HR 96 | Wt 336.0 lb

## 2020-10-12 DIAGNOSIS — Z3A31 31 weeks gestation of pregnancy: Secondary | ICD-10-CM

## 2020-10-12 DIAGNOSIS — O0993 Supervision of high risk pregnancy, unspecified, third trimester: Secondary | ICD-10-CM

## 2020-10-12 DIAGNOSIS — O10919 Unspecified pre-existing hypertension complicating pregnancy, unspecified trimester: Secondary | ICD-10-CM | POA: Diagnosis not present

## 2020-10-12 DIAGNOSIS — O24419 Gestational diabetes mellitus in pregnancy, unspecified control: Secondary | ICD-10-CM

## 2020-10-12 DIAGNOSIS — Z1389 Encounter for screening for other disorder: Secondary | ICD-10-CM

## 2020-10-12 LAB — POCT URINALYSIS DIPSTICK OB
Blood, UA: NEGATIVE
Glucose, UA: NEGATIVE
Ketones, UA: NEGATIVE
Leukocytes, UA: NEGATIVE
Nitrite, UA: NEGATIVE

## 2020-10-12 NOTE — Progress Notes (Addendum)
   NURSE VISIT- NST  SUBJECTIVE:  Madison Mcgrath is a 30 y.o. G1P0000 female at [redacted]w[redacted]d, here for a NST for pregnancy complicated by [facility] and A2DM currently on Metformin .  She reports active fetal movement, contractions: none, vaginal bleeding: none, membranes: intact.   OBJECTIVE:  BP 134/74   Pulse 96   Wt (!) 336 lb (152.4 kg)   LMP  (LMP Unknown)   BMI 55.91 kg/m   Appears well, no apparent distress  Results for orders placed or performed in visit on 10/12/20 (from the past 24 hour(s))  POC Urinalysis Dipstick OB   Collection Time: 10/12/20  2:36 PM  Result Value Ref Range   Color, UA     Clarity, UA     Glucose, UA Negative Negative   Bilirubin, UA     Ketones, UA neg    Spec Grav, UA     Blood, UA neg    pH, UA     POC,PROTEIN,UA Trace Negative, Trace, Small (1+), Moderate (2+), Large (3+), 4+   Urobilinogen, UA     Nitrite, UA neg    Leukocytes, UA Negative Negative   Appearance     Odor      NST: FHR baseline 140 bpm, Variability: moderate, Accelerations:present, Decelerations:  Absent= Cat 1/reactive Toco: none   ASSESSMENT: G1P0000 at [redacted]w[redacted]d with CHTN and A2DM currently on metformin NST reactive  PLAN: EFM strip reviewed by Dr. Despina Hidden   Recommendations: keep next appointment as scheduled    Jobe Marker  10/12/2020 5:00 PM   Attestation of Attending Supervision of Nursing Visit Encounter: Evaluation and management procedures were performed by the nursing staff under my supervision and collaboration.  I have reviewed the nurse's note and chart, and I agree with the management and plan.  Rockne Coons MD Attending Physician for the Center for [facility] 10/14/2020 11:30 AM

## 2020-10-14 ENCOUNTER — Other Ambulatory Visit: Payer: Self-pay

## 2020-10-14 ENCOUNTER — Ambulatory Visit (INDEPENDENT_AMBULATORY_CARE_PROVIDER_SITE_OTHER): Payer: 59

## 2020-10-14 ENCOUNTER — Other Ambulatory Visit: Payer: Self-pay | Admitting: Advanced Practice Midwife

## 2020-10-14 VITALS — BP 122/78 | HR 87 | Temp 98.6°F | Ht 65.0 in | Wt 335.0 lb

## 2020-10-14 DIAGNOSIS — O24419 Gestational diabetes mellitus in pregnancy, unspecified control: Secondary | ICD-10-CM

## 2020-10-14 DIAGNOSIS — Z0001 Encounter for general adult medical examination with abnormal findings: Secondary | ICD-10-CM | POA: Diagnosis not present

## 2020-10-14 DIAGNOSIS — O10919 Unspecified pre-existing hypertension complicating pregnancy, unspecified trimester: Secondary | ICD-10-CM

## 2020-10-14 DIAGNOSIS — O99213 Obesity complicating pregnancy, third trimester: Secondary | ICD-10-CM

## 2020-10-14 DIAGNOSIS — Z0000 Encounter for general adult medical examination without abnormal findings: Secondary | ICD-10-CM

## 2020-10-14 NOTE — Progress Notes (Signed)
Subjective:   Madison Mcgrath is a 30 y.o. female who presents for an Initial Medicare Annual Wellness Visit.  Review of Systems     Cardiac Risk Factors include: diabetes mellitus;hypertension;obesity (BMI >30kg/m2);sedentary lifestyle     Objective:    Today's Vitals   10/14/20 1055  BP: 122/78  Pulse: 87  Temp: 98.6 F (37 C)  SpO2: 98%  Weight: (!) 335 lb (152 kg)  Height: 5' 5"  (1.651 m)   Body mass index is 55.75 kg/m.  Advanced Directives 10/14/2020 08/22/2020 07/08/2020 06/13/2020 04/06/2020 02/05/2019 07/31/2018  Does Patient Have a Medical Advance Directive? No No No No No No No  Would patient like information on creating a medical advance directive? No - Patient declined No - Patient declined No - Patient declined - - - -    Current Medications (verified) Outpatient Encounter Medications as of 10/14/2020  Medication Sig   aspirin 81 MG EC tablet Take 2 tablets (162 mg total) by mouth daily. Swallow whole.   Blood Glucose Monitoring Suppl (ONETOUCH VERIO) w/Device KIT Use as directed to check blood sugar 4 times daily   glucose blood test strip Use as instructed   labetalol (NORMODYNE) 100 MG tablet TAKE 1 TABLET(100 MG) BY MOUTH TWICE DAILY   lamoTRIgine (LAMICTAL) 100 MG tablet Take 100 mg by mouth daily.   Lancets (ONETOUCH DELICA PLUS [id number]) MISC Use to check blood sugar 4 times daily   levothyroxine (SYNTHROID) 88 MCG tablet Take 88 mcg by mouth every morning.   metFORMIN (GLUCOPHAGE) 500 MG tablet Take 1 tablet (500 mg total) by mouth 2 (two) times daily with a meal. (Patient taking differently: Take 500 mg by mouth 2 (two) times daily with a meal. 500 mg in the morning 1000 mg at night)   omeprazole (PRILOSEC OTC) 20 MG tablet Take 1 tablet (20 mg total) by mouth daily.   Prenatal Vit-Fe Fumarate-FA (PRENATAL VITAMIN PO) Take 1 tablet by mouth every morning.   famotidine (PEPCID) 20 MG tablet Take 1 tablet (20 mg total) by mouth 2 (two) times daily.   No  facility-administered encounter medications on file as of 10/14/2020.    Allergies (verified) Bee venom   History: Past Medical History:  Diagnosis Date   Anxiety    Depression    GERD (gastroesophageal reflux disease)    Hypertension    Thyroid disease    Vaginal Pap smear, abnormal    Past Surgical History:  Procedure Laterality Date   CHOLECYSTECTOMY     LEG SURGERY Left    stick removed from leg   Family History  Problem Relation Age of Onset   High blood pressure Mother    Osteoporosis Mother    Dementia Mother    Diabetes Maternal Uncle    Cancer Maternal Grandmother    Diabetes Maternal Grandfather    Colon cancer Paternal Grandmother    Kidney failure Paternal Grandmother    Cleft lip Other    Autism Other    Bipolar disorder Other    Social History   Socioeconomic History   Marital status: Single    Spouse name: Not on file   Number of children: Not on file   Years of education: Not on file   Highest education level: Not on file  Occupational History   Not on file  Tobacco Use   Smoking status: Never   Smokeless tobacco: Never  Vaping Use   Vaping Use: Former  Substance and Sexual Activity   Alcohol  use: Not Currently   Drug use: Never   Sexual activity: Yes    Birth control/protection: None  Other Topics Concern   Not on file  Social History Narrative   Has boyfriend. Currently pregnant, Autumn Rose, due 12/12/2020.   Social Determinants of Health   Financial Resource Strain: Low Risk    Difficulty of Paying Living Expenses: Not hard at all  Food Insecurity: No Food Insecurity   Worried About Charity fundraiser in the Last Year: Never true   [place] in the Last Year: Never true  Transportation Needs: No Transportation Needs   Lack of Transportation (Medical): No   Lack of Transportation (Non-Medical): No  Physical Activity: Inactive   Days of Exercise per Week: 0 days   Minutes of Exercise per Session: 0 min  Stress: No  Stress Concern Present   Feeling of Stress : Not at all  Social Connections: Moderately Isolated   Frequency of Communication with Friends and Family: More than three times a week   Frequency of Social Gatherings with Friends and Family: More than three times a week   Attends Religious Services: Never   Marine scientist or Organizations: No   Attends Music therapist: Never   Marital Status: Living with partner    Tobacco Counseling Counseling given: Not Answered   Clinical Intake:  Pre-visit preparation completed: Yes  Pain : No/denies pain     BMI - recorded: 55.75 Nutritional Status: BMI > 30  Obese Nutritional Risks: None Diabetes: Yes (Gestational Diabetes.)  How often do you need to have someone help you when you read instructions, pamphlets, or other written materials from your doctor or pharmacy?: 1 - Never  Diabetic?Nutrition Risk Assessment:  Has the patient had any N/V/D within the last 2 months?  No  Does the patient have any non-healing wounds?  No  Has the patient had any unintentional weight loss or weight gain?  No   Diabetes:  Is the patient diabetic?  Yes  If diabetic, was a CBG obtained today?  No  Did the patient bring in their glucometer from home?  No  How often do you monitor your CBG's? 4 times per day.   Financial Strains and Diabetes Management:  Are you having any financial strains with the device, your supplies or your medication? No .  Does the patient want to be seen by Chronic Care Management for management of their diabetes?  No  Would the patient like to be referred to a Nutritionist or for Diabetic Management?  No   Diabetic Exams:  Diabetic Eye Exam: Completed 09/20/2020. Pt has been advised about the importance in completing this exam.   Diabetic Foot Exam: Completed NOT DONE. Pt has been advised about the importance in completing this exam. Pt has dx of gestational diabetes due to current pregnancy. Being  followed by [facility].    Interpreter Needed?: No  Information entered by :: Randal Buba, LPN   Activities of Daily Living In your present state of health, do you have any difficulty performing the following activities: 10/14/2020  Hearing? N  Vision? N  Difficulty concentrating or making decisions? Y  Comment Pt states she does have ADHD  Walking or climbing stairs? N  Dressing or bathing? N  Doing errands, shopping? N  Preparing Food and eating ? N  Using the Toilet? N  In the past six months, have you accidently leaked urine? Y  Comment Stress incontinence  due to pregnancy  Do you have problems with loss of bowel control? N  Managing your Medications? N  Managing your Finances? N  Housekeeping or managing your Housekeeping? N  Some recent data might be hidden    Patient Care Team: Noreene Larsson, NP as PCP - General (Nurse Practitioner)  Indicate any recent Medical Services you may have received from other than [facility] providers in the past year (date may be approximate).     Assessment:   This is a routine wellness examination for Aerith.  Hearing/Vision screen Hearing Screening - Comments:: No hearing issues.  Vision Screening - Comments:: Up to date on eye exam, 09/20/20. [facility]  Dietary issues and exercise activities discussed: Current Exercise Habits: The patient does not participate in regular exercise at present, Exercise limited by: psychological condition(s);cardiac condition(s)   Goals Addressed             This Visit's Progress    DIET - INCREASE WATER INTAKE       Increase water intake. Pt also wants to have healthy baby, get a car and larger home.        Depression Screen PHQ 2/9 Scores 10/14/2020 07/29/2020 07/08/2020 06/18/2020  PHQ - 2 Score 0 1 0 1  PHQ- 9 Score - - - 3    Fall Risk Fall Risk  10/14/2020 07/29/2020 07/08/2020 04/15/2020  Falls in the past year? 1 0 0 0  Number falls in past yr: 0 0 - -  Injury with Fall? 0 0 - -  Risk for  fall due to : Impaired vision No Fall Risks - -  Follow up Falls prevention discussed Falls evaluation completed - -    FALL RISK PREVENTION PERTAINING TO THE HOME:  Any stairs in or around the home? No  If so, are there any without handrails? No  Home free of loose throw rugs in walkways, pet beds, electrical cords, etc? Yes  Adequate lighting in your home to reduce risk of falls? Yes   ASSISTIVE DEVICES UTILIZED TO PREVENT FALLS:  Life alert? Yes  Use of a cane, walker or w/c? No  Grab bars in the bathroom? No  Shower chair or bench in shower? No  Elevated toilet seat or a handicapped toilet? No   TIMED UP AND GO:  Was the test performed? Yes .  Length of time to ambulate 10 feet: 5 sec.   Gait steady and fast without use of assistive device  Cognitive Function:     6CIT Screen 10/14/2020  What time? 0 points  Count back from 20 0 points  Months in reverse 0 points  Repeat phrase 0 points    Immunizations  There is no immunization history on file for this patient.  TDAP status: Due, Education has been provided regarding the importance of this vaccine. Advised may receive this vaccine at local pharmacy or Health Dept. Aware to provide a copy of the vaccination record if obtained from local pharmacy or Health Dept. Verbalized acceptance and understanding.  Flu Vaccine status: Due, Education has been provided regarding the importance of this vaccine. Advised may receive this vaccine at local pharmacy or Health Dept. Aware to provide a copy of the vaccination record if obtained from local pharmacy or Health Dept. Verbalized acceptance and understanding.  Pneumococcal vaccine status: Declined,  Education has been provided regarding the importance of this vaccine but patient still declined. Advised may receive this vaccine at local pharmacy or Health Dept. Aware to provide a copy  of the vaccination record if obtained from local pharmacy or Health Dept. Verbalized acceptance and  understanding.   Covid-19 vaccine status: Declined, Education has been provided regarding the importance of this vaccine but patient still declined. Advised may receive this vaccine at local pharmacy or Health Dept.or vaccine clinic. Aware to provide a copy of the vaccination record if obtained from local pharmacy or Health Dept. Verbalized acceptance and understanding.  Qualifies for Shingles Vaccine? No   Zostavax completed No   Shingrix Completed?: No.    Education has been provided regarding the importance of this vaccine. Patient has been advised to call insurance company to determine out of pocket expense if they have not yet received this vaccine. Advised may also receive vaccine at local pharmacy or Health Dept. Verbalized acceptance and understanding.  Screening Tests Health Maintenance  Topic Date Due   COVID-19 Vaccine (1) Never done   URINE MICROALBUMIN  Never done   TETANUS/TDAP  Never done   INFLUENZA VACCINE  Never done   PAP SMEAR-Modifier  10/15/2021   Hepatitis C Screening  Completed   HIV Screening  Completed   HPV VACCINES  Aged Out    Health Maintenance  Health Maintenance Due  Topic Date Due   COVID-19 Vaccine (1) Never done   URINE MICROALBUMIN  Never done   TETANUS/TDAP  Never done   INFLUENZA VACCINE  Never done    Colorectal cancer screening: No longer required.   Mammogram status: No longer required due to AGE.  BONE DENSITY: NOT DONE DUE TO AGE.  Lung Cancer Screening: (Low Dose CT Chest recommended if Age 21-80 years, 30 pack-year currently smoking OR have quit w/in 15years.) does not qualify.     Additional Screening:  Hepatitis C Screening: does not qualify  Vision Screening: Recommended annual ophthalmology exams for early detection of glaucoma and other disorders of the eye. Is the patient up to date with their annual eye exam?  Yes  Who is the provider or what is the name of the office in which the patient attends annual eye exams?  [facility] If pt is not established with a provider, would they like to be referred to a provider to establish care? No .   Dental Screening: Recommended annual dental exams for proper oral hygiene  Community Resource Referral / Chronic Care Management: CRR required this visit?  No   CCM required this visit?  No      Plan:     I have personally reviewed and noted the following in the patient's chart:   Medical and social history Use of alcohol, tobacco or illicit drugs  Current medications and supplements including opioid prescriptions. Patient is not currently taking opioid prescriptions. Functional ability and status Nutritional status Physical activity Advanced directives List of other physicians Hospitalizations, surgeries, and ER visits in previous 12 months Vitals Screenings to include cognitive, depression, and falls Referrals and appointments  In addition, I have reviewed and discussed with patient certain preventive protocols, quality metrics, and best practice recommendations. A written personalized care plan for preventive services as well as general preventive health recommendations were provided to patient.     Chriss Driver, LPN   7/90/2409   Nurse Notes: Pt states she is doing well. Currently pregnant with EDD of 12/12/2020. Has Gestational Diabetes and checks blood sugars QID but forgot this morning per pt. Pt is due for flu and tetanus vaccines. Discussed with patient how to obtain them. Pt is currently being followed by [facility].

## 2020-10-14 NOTE — Patient Instructions (Signed)
Madison Mcgrath , Thank you for taking time to come for your Medicare Wellness Visit. I appreciate your ongoing commitment to your health goals. Please review the following plan we discussed and let me know if I can assist you in the future.   Screening recommendations/referrals: Colonoscopy: Not required until age 32. Recommended yearly ophthalmology/optometry visit for glaucoma screening and checkup Recommended yearly dental visit for hygiene and checkup  Vaccinations: Influenza vaccine: Done 01/09/2018. Repeat annually  Pneumococcal vaccine: Not required until age 62 Tdap vaccine: Done 04/05/2010. Repeat in 10 years  Shingles vaccine: Not required until age 46.   Covid-19: Declined.  Advanced directives: Advance directive discussed with you today. Even though you declined this today, please call our office should you change your mind, and we can give you the proper paperwork for you to fill out.   Conditions/risks identified: Aim for 30 minutes of exercise or walking each day, drink 6-8 glasses of water and eat lots of fruits and vegetables.   Next appointment: Follow up in one year for your annual wellness visit 10/18/2021 @ 11.  Preventive Care 40-64 Years, Female Preventive care refers to lifestyle choices and visits with your health care provider that can promote health and wellness. What does preventive care include? A yearly physical exam. This is also called an annual well check. Dental exams once or twice a year. Routine eye exams. Ask your health care provider how often you should have your eyes checked. Personal lifestyle choices, including: Daily care of your teeth and gums. Regular physical activity. Eating a healthy diet. Avoiding tobacco and drug use. Limiting alcohol use. Practicing safe sex. Taking low-dose aspirin every day starting at age 83. What happens during an annual well check? The services and screenings done by your health care provider during your annual  well check will depend on your age, overall health, lifestyle risk factors, and family history of disease. Counseling  Your health care provider may ask you questions about your: Alcohol use. Tobacco use. Drug use. Emotional well-being. Home and relationship well-being. Sexual activity. Eating habits. Work and work Astronomer. Screening  You may have the following tests or measurements: Height, weight, and BMI. Blood pressure. Lipid and cholesterol levels. These may be checked every 5 years, or more frequently if you are over 36 years old. Skin check. Lung cancer screening. You may have this screening every year starting at age 6 if you have a 30-pack-year history of smoking and currently smoke or have quit within the past 15 years. Fecal occult blood test (FOBT) of the stool. You may have this test every year starting at age 39. Flexible sigmoidoscopy or colonoscopy. You may have a sigmoidoscopy every 5 years or a colonoscopy every 10 years starting at age 20. Prostate cancer screening. Recommendations will vary depending on your family history and other risks. Hepatitis C blood test. Hepatitis B blood test. Sexually transmitted disease (STD) testing. Diabetes screening. This is done by checking your blood sugar (glucose) after you have not eaten for a while (fasting). You may have this done every 1-3 years. Discuss your test results, treatment options, and if necessary, the need for more tests with your health care provider. Vaccines  Your health care provider may recommend certain vaccines, such as: Influenza vaccine. This is recommended every year. Tetanus, diphtheria, and acellular pertussis (Tdap, Td) vaccine. You may need a Td booster every 10 years. Zoster vaccine. You may need this after age 25. Pneumococcal 13-valent conjugate (PCV13) vaccine. You may need this  if you have certain conditions and have not been vaccinated. Pneumococcal polysaccharide (PPSV23) vaccine. You  may need one or two doses if you smoke cigarettes or if you have certain conditions. Talk to your health care provider about which screenings and vaccines you need and how often you need them. This information is not intended to replace advice given to you by your health care provider. Make sure you discuss any questions you have with your health care provider. Document Released: 02/06/2015 Document Revised: 09/30/2015 Document Reviewed: 11/11/2014 Elsevier Interactive Patient Education  2017 [employer].  Fall Prevention in the Home Falls can cause injuries. They can happen to people of all ages. There are many things you can do to make your home safe and to help prevent falls. What can I do on the outside of my home? Regularly fix the edges of walkways and driveways and fix any cracks. Remove anything that might make you trip as you walk through a door, such as a raised step or threshold. Trim any bushes or trees on the path to your home. Use bright outdoor lighting. Clear any walking paths of anything that might make someone trip, such as rocks or tools. Regularly check to see if handrails are loose or broken. Make sure that both sides of any steps have handrails. Any raised decks and porches should have guardrails on the edges. Have any leaves, snow, or ice cleared regularly. Use sand or salt on walking paths during winter. Clean up any spills in your garage right away. This includes oil or grease spills. What can I do in the bathroom? Use night lights. Install grab bars by the toilet and in the tub and shower. Do not use towel bars as grab bars. Use non-skid mats or decals in the tub or shower. If you need to sit down in the shower, use a plastic, non-slip stool. Keep the floor dry. Clean up any water that spills on the floor as soon as it happens. Remove soap buildup in the tub or shower regularly. Attach bath mats securely with double-sided non-slip rug tape. Do not have throw  rugs and other things on the floor that can make you trip. What can I do in the bedroom? Use night lights. Make sure that you have a light by your bed that is easy to reach. Do not use any sheets or blankets that are too big for your bed. They should not hang down onto the floor. Have a firm chair that has side arms. You can use this for support while you get dressed. Do not have throw rugs and other things on the floor that can make you trip. What can I do in the kitchen? Clean up any spills right away. Avoid walking on wet floors. Keep items that you use a lot in easy-to-reach places. If you need to reach something above you, use a strong step stool that has a grab bar. Keep electrical cords out of the way. Do not use floor polish or wax that makes floors slippery. If you must use wax, use non-skid floor wax. Do not have throw rugs and other things on the floor that can make you trip. What can I do with my stairs? Do not leave any items on the stairs. Make sure that there are handrails on both sides of the stairs and use them. Fix handrails that are broken or loose. Make sure that handrails are as long as the stairways. Check any carpeting to make sure that it  is firmly attached to the stairs. Fix any carpet that is loose or worn. Avoid having throw rugs at the top or bottom of the stairs. If you do have throw rugs, attach them to the floor with carpet tape. Make sure that you have a light switch at the top of the stairs and the bottom of the stairs. If you do not have them, ask someone to add them for you. What else can I do to help prevent falls? Wear shoes that: Do not have high heels. Have rubber bottoms. Are comfortable and fit you well. Are closed at the toe. Do not wear sandals. If you use a stepladder: Make sure that it is fully opened. Do not climb a closed stepladder. Make sure that both sides of the stepladder are locked into place. Ask someone to hold it for you, if  possible. Clearly mark and make sure that you can see: Any grab bars or handrails. First and last steps. Where the edge of each step is. Use tools that help you move around (mobility aids) if they are needed. These include: Canes. Walkers. Scooters. Crutches. Turn on the lights when you go into a dark area. Replace any light bulbs as soon as they burn out. Set up your furniture so you have a clear path. Avoid moving your furniture around. If any of your floors are uneven, fix them. If there are any pets around you, be aware of where they are. Review your medicines with your doctor. Some medicines can make you feel dizzy. This can increase your chance of falling. Ask your doctor what other things that you can do to help prevent falls. This information is not intended to replace advice given to you by your health care provider. Make sure you discuss any questions you have with your health care provider. Document Released: 11/06/2008 Document Revised: 06/18/2015 Document Reviewed: 02/14/2014 Elsevier Interactive Patient Education  2017 [employer].

## 2020-10-15 ENCOUNTER — Encounter: Payer: Self-pay | Admitting: Family Medicine

## 2020-10-15 ENCOUNTER — Ambulatory Visit (INDEPENDENT_AMBULATORY_CARE_PROVIDER_SITE_OTHER): Payer: 59

## 2020-10-15 ENCOUNTER — Ambulatory Visit (INDEPENDENT_AMBULATORY_CARE_PROVIDER_SITE_OTHER): Payer: 59 | Admitting: Family Medicine

## 2020-10-15 VITALS — BP 111/76 | HR 90 | Wt 334.0 lb

## 2020-10-15 DIAGNOSIS — O24419 Gestational diabetes mellitus in pregnancy, unspecified control: Secondary | ICD-10-CM

## 2020-10-15 DIAGNOSIS — E039 Hypothyroidism, unspecified: Secondary | ICD-10-CM

## 2020-10-15 DIAGNOSIS — O10919 Unspecified pre-existing hypertension complicating pregnancy, unspecified trimester: Secondary | ICD-10-CM

## 2020-10-15 DIAGNOSIS — O0993 Supervision of high risk pregnancy, unspecified, third trimester: Secondary | ICD-10-CM

## 2020-10-15 DIAGNOSIS — Z3A31 31 weeks gestation of pregnancy: Secondary | ICD-10-CM | POA: Diagnosis not present

## 2020-10-15 DIAGNOSIS — O99213 Obesity complicating pregnancy, third trimester: Secondary | ICD-10-CM | POA: Diagnosis not present

## 2020-10-15 DIAGNOSIS — Z6841 Body Mass Index (BMI) 40.0 and over, adult: Secondary | ICD-10-CM

## 2020-10-15 DIAGNOSIS — F319 Bipolar disorder, unspecified: Secondary | ICD-10-CM

## 2020-10-15 LAB — POCT URINALYSIS DIPSTICK OB
Blood, UA: NEGATIVE
Glucose, UA: NEGATIVE
Leukocytes, UA: NEGATIVE
Nitrite, UA: NEGATIVE
POC,PROTEIN,UA: NEGATIVE

## 2020-10-15 NOTE — Progress Notes (Signed)
    Subjective:  Madison Mcgrath is a 30 y.o. G1P0000 at [redacted]w[redacted]d being seen today for ongoing prenatal care.  She is currently monitored for the following issues for this high-risk pregnancy and has Attention deficit hyperactivity disorder (ADHD); Bipolar disorder (HCC); Morbid obesity with BMI of 50.0-59.9, adult (HCC); Hypothyroid; Skin lesion of back; Chronic hypertension affecting pregnancy; Supervision of high-risk pregnancy; Gestational diabetes mellitus (GDM); and Cough on their problem list.  Patient reports no complaints, occasional backache.  Contractions: Not present. Vag. Bleeding: None.  Movement: Present. Denies leaking of fluid.   A2/BDM: Fasting <95, 2 hour pp usually around 115. Didn't not bring log of CBGs.  Chronic hypertension: Not checking bp at home, has cuff. Taking medication everyday. No HA, blurred vision, RUQ abdominal pain.  Hypothyroid: taking synthroid with no issue.   The following portions of the patient's history were reviewed and updated as appropriate: allergies, current medications, past family history, past medical history, past social history, past surgical history and problem list.   Objective:   Vitals:   10/15/20 1213  BP: 111/76  Pulse: 90  Weight: (!) 334 lb (151.5 kg)    Fetal Status: Fetal Heart Rate (bpm): 145   Movement: Present     General:  Alert, oriented and cooperative. Patient is in no acute distress.  Skin: Skin is warm and dry. No rash noted.   Cardiovascular: Normal heart rate noted  Respiratory: Normal respiratory effort, no problems with respiration noted  Abdomen: Soft, gravid, appropriate for gestational age. Pain/Pressure: Absent     Pelvic:  Cervical exam deferred        Extremities: Normal range of motion.  Edema: None  Mental Status: Normal mood and affect. Normal behavior. Normal judgment and thought content.   Urine dipstick: negative glucose, blood, protein and leukocytes. +ketones.    Assessment and Plan:  Pregnancy:  G1P0000 at [redacted]w[redacted]d  1. Supervision of high risk pregnancy in third trimester Doing well with normal fetal movement.   2. Gestational diabetes mellitus (GDM), antepartum, gestational diabetes method of control unspecified CBGs controlled per patient report, instructed to bring log on follow up visit. Cont metformin as is. No glucose in urine and feeling well, however with ketones. Does not drink water on regular basis, encouraged increasing water intake. --Start weekly antenatal testing next week, appts already scheduled  - POC Urinalysis Dipstick OB  3. Chronic hypertension affecting pregnancy BP well controlled. Cont labetalol BID. No concerning s/sx, no proteinuria.  - POC Urinalysis Dipstick OB  4. Hypothyroidism, unspecified type Cont synthroid daily.   5. Bipolar affective disorder, remission status unspecified (HCC) Doing well, no meds.   6. Morbid obesity with BMI of 50.0-59.9, adult (HCC) Antenatal testing/US as above.   7. [redacted] weeks gestation of pregnancy   Preterm labor symptoms and general obstetric precautions including but not limited to vaginal bleeding, contractions, leaking of fluid and fetal movement were reviewed in detail with the patient. Please refer to After Visit Summary for other counseling recommendations.   Follow up next week for testing/HROB follow up.    Allayne Stack, DO

## 2020-10-15 NOTE — Progress Notes (Signed)
[place] 31+5 wks,cephalic,BPP 8/8,FHR 140 BPM,posterior placenta gr 0,AFI 14 cm,RI .58,.61=38%,EFW 2014 g 68%

## 2020-10-16 ENCOUNTER — Other Ambulatory Visit: Payer: Self-pay

## 2020-10-16 DIAGNOSIS — O24410 Gestational diabetes mellitus in pregnancy, diet controlled: Secondary | ICD-10-CM

## 2020-10-16 MED ORDER — ONETOUCH VERIO W/DEVICE KIT: PACK | 0 refills | Status: DC

## 2020-10-19 ENCOUNTER — Other Ambulatory Visit: Payer: 59

## 2020-10-21 ENCOUNTER — Other Ambulatory Visit: Payer: Self-pay | Admitting: Obstetrics & Gynecology

## 2020-10-21 DIAGNOSIS — O24419 Gestational diabetes mellitus in pregnancy, unspecified control: Secondary | ICD-10-CM

## 2020-10-21 DIAGNOSIS — O10919 Unspecified pre-existing hypertension complicating pregnancy, unspecified trimester: Secondary | ICD-10-CM

## 2020-10-22 ENCOUNTER — Encounter: Payer: Self-pay | Admitting: Obstetrics & Gynecology

## 2020-10-22 ENCOUNTER — Ambulatory Visit (INDEPENDENT_AMBULATORY_CARE_PROVIDER_SITE_OTHER): Payer: 59

## 2020-10-22 ENCOUNTER — Ambulatory Visit (INDEPENDENT_AMBULATORY_CARE_PROVIDER_SITE_OTHER): Payer: 59 | Admitting: Obstetrics & Gynecology

## 2020-10-22 ENCOUNTER — Other Ambulatory Visit: Payer: Self-pay

## 2020-10-22 VITALS — BP 119/83 | HR 96 | Wt 332.0 lb

## 2020-10-22 DIAGNOSIS — Z3A32 32 weeks gestation of pregnancy: Secondary | ICD-10-CM | POA: Diagnosis not present

## 2020-10-22 DIAGNOSIS — O0993 Supervision of high risk pregnancy, unspecified, third trimester: Secondary | ICD-10-CM

## 2020-10-22 DIAGNOSIS — Z331 Pregnant state, incidental: Secondary | ICD-10-CM

## 2020-10-22 DIAGNOSIS — O10919 Unspecified pre-existing hypertension complicating pregnancy, unspecified trimester: Secondary | ICD-10-CM

## 2020-10-22 DIAGNOSIS — O24419 Gestational diabetes mellitus in pregnancy, unspecified control: Secondary | ICD-10-CM

## 2020-10-22 DIAGNOSIS — Z1389 Encounter for screening for other disorder: Secondary | ICD-10-CM

## 2020-10-22 LAB — POCT URINALYSIS DIPSTICK OB
Blood, UA: NEGATIVE
Glucose, UA: NEGATIVE
Ketones, UA: NEGATIVE
Leukocytes, UA: NEGATIVE
Nitrite, UA: NEGATIVE

## 2020-10-22 MED ORDER — PRENATAL VITAMIN 27-0.8 MG PO TABS: 1.0000 | ORAL_TABLET | Freq: Every morning | ORAL | 1 refills | Status: DC

## 2020-10-22 NOTE — Progress Notes (Signed)
[place] 32+5 wks,cephalic,fhr 154 bpm,RI .50,.52,.54,.40=4.8%,BPP 8/8,AFI 11 cm,limited view

## 2020-10-22 NOTE — Progress Notes (Signed)
HIGH-RISK PREGNANCY VISIT Patient name: Madison Mcgrath MRN [id number]  Date of birth: [birth year] Chief Complaint:   Routine Prenatal Visit (Ultrasound)  History of Present Illness:   Madison Mcgrath is a 30 y.o. G69P0000 female at [redacted]w[redacted]d with an Estimated Date of Delivery: 12/12/20 being seen today for ongoing management of a high-risk pregnancy complicated by chronic hypertension currently on labetalol 100 BID and diabetes mellitus A2/BDM currently on metformin 500 in am 1000mg  in PM .    Today she reports no complaints. Contractions: Irritability.  .  Movement: Present. denies leaking of fluid.   Depression screen [facility] 2/9 10/14/2020 07/29/2020 07/08/2020 06/18/2020  Decreased Interest 0 0 0 0  Down, Depressed, Hopeless 0 1 0 1  PHQ - 2 Score 0 1 0 1  Altered sleeping - - - 0  Tired, decreased energy - - - 1  Change in appetite - - - 1  Feeling bad or failure about yourself  - - - 0  Trouble concentrating - - - 0  Moving slowly or fidgety/restless - - - 0  Suicidal thoughts - - - 0  PHQ-9 Score - - - 3     GAD 7 : Generalized Anxiety Score 06/18/2020  Nervous, Anxious, on Edge 0  Control/stop worrying 1  Worry too much - different things 1  Trouble relaxing 0  Restless 0  Easily annoyed or irritable 1  Afraid - awful might happen 0  Total GAD 7 Score 3     Review of Systems:   Pertinent items are noted in HPI Denies abnormal vaginal discharge w/ itching/odor/irritation, headaches, visual changes, shortness of breath, chest pain, abdominal pain, severe nausea/vomiting, or problems with urination or bowel movements unless otherwise stated above. Pertinent History Reviewed:  Reviewed past medical,surgical, social, obstetrical and family history.  Reviewed problem list, medications and allergies. Physical Assessment:   Vitals:   10/22/20 1550  BP: 119/83  Pulse: 96  Weight: (!) 332 lb (150.6 kg)  Body mass index is 55.25 kg/m.           Physical Examination:   General appearance:  alert, well appearing, and in no distress  Mental status: alert, oriented to person, place, and time  Skin: warm & dry   Extremities: Edema: None    Cardiovascular: normal heart rate noted  Respiratory: normal respiratory effort, no distress  Abdomen: gravid, soft, non-tender  Pelvic: Cervical exam deferred         Fetal Status:     Movement: Present    Fetal Surveillance Testing today: BPP 8/8 with normal Dopplers   Chaperone: N/A    Results for orders placed or performed in visit on 10/22/20 (from the past 24 hour(s))  POC Urinalysis Dipstick OB   Collection Time: 10/22/20  3:48 PM  Result Value Ref Range   Color, UA     Clarity, UA     Glucose, UA Negative Negative   Bilirubin, UA     Ketones, UA neg    Spec Grav, UA     Blood, UA neg    pH, UA     POC,PROTEIN,UA Trace Negative, Trace, Small (1+), Moderate (2+), Large (3+), 4+   Urobilinogen, UA     Nitrite, UA neg    Leukocytes, UA Negative Negative   Appearance     Odor      Assessment & Plan:  High-risk pregnancy: G1P0000 at [redacted]w[redacted]d with an Estimated Date of Delivery: 12/12/20   1) CHTN on labetalol 100  mg BID, stable  2) A2/B DM on 500 metformin in am 1000 mg in PM, stable Acceptable CBG Meds: No orders of the defined types were placed in this encounter.   Labs/procedures today: U/S  Treatment Plan:  twice weekly surveillance  Reviewed: Preterm labor symptoms and general obstetric precautions including but not limited to vaginal bleeding, contractions, leaking of fluid and fetal movement were reviewed in detail with the patient.  All questions were answered. Does have home bp cuff. Office bp cuff given: not applicable. Check bp daily, let us know if consistently >140 and/or >90.  Follow-up: No follow-ups on file.   Future Appointments  Date Time Provider Department Center  10/26/2020  2:30 PM [facility] NURSE [facility]  10/29/2020  2:15 PM [facility] [place] [facility] None  10/29/2020  3:10 PM  Cresenzo-Dishmon, [place], CNM [facility]  11/02/2020  2:30 PM [facility] NURSE [facility]  11/05/2020  2:00 PM [facility] [place] [facility] None  11/05/2020  2:50 PM Cheral Marker, CNM [facility]  11/09/2020 10:50 AM [facility] NURSE [facility]  11/12/2020 10:30 AM [facility] [place] [facility] None  11/12/2020 11:15 AM Myna Hidalgo, DO [facility]  11/16/2020  2:30 PM [facility] NURSE [facility]  11/19/2020  2:15 PM [facility] [place] [facility] None  11/19/2020  3:10 PM Myna Hidalgo, DO [facility]  11/26/2020  2:00 PM [facility] [place] [facility] None  11/26/2020  3:10 PM Lazaro Arms, MD [facility]  11/30/2020  2:30 PM [facility] NURSE [facility]  12/03/2020  2:00 PM [facility] [place] [facility] None  12/03/2020  2:50 PM Myna Hidalgo, DO [facility]  12/07/2020  2:30 PM [facility] NURSE [facility]  12/10/2020  2:00 PM [facility] [place] [facility] None  12/10/2020  2:50 PM Lazaro Arms, MD [facility]  01/29/2021 10:00 AM Heather Roberts, NP [facility]  10/18/2021 11:00 AM [facility] NURSE ADVISOR [facility]    Orders Placed This Encounter  Procedures   POC Urinalysis Dipstick OB   Lazaro Arms  10/22/2020 4:27 PM

## 2020-10-26 ENCOUNTER — Other Ambulatory Visit: Payer: 59

## 2020-10-28 ENCOUNTER — Other Ambulatory Visit: Payer: Self-pay | Admitting: Obstetrics & Gynecology

## 2020-10-28 DIAGNOSIS — O24419 Gestational diabetes mellitus in pregnancy, unspecified control: Secondary | ICD-10-CM

## 2020-10-28 DIAGNOSIS — K831 Obstruction of bile duct: Secondary | ICD-10-CM

## 2020-10-29 ENCOUNTER — Encounter: Payer: 59 | Admitting: Advanced Practice Midwife

## 2020-10-29 ENCOUNTER — Other Ambulatory Visit: Payer: 59

## 2020-11-02 ENCOUNTER — Other Ambulatory Visit: Payer: 59

## 2020-11-05 ENCOUNTER — Other Ambulatory Visit: Payer: Self-pay

## 2020-11-05 ENCOUNTER — Ambulatory Visit (INDEPENDENT_AMBULATORY_CARE_PROVIDER_SITE_OTHER): Payer: 59 | Admitting: Women's Health

## 2020-11-05 ENCOUNTER — Ambulatory Visit (INDEPENDENT_AMBULATORY_CARE_PROVIDER_SITE_OTHER): Payer: 59

## 2020-11-05 ENCOUNTER — Encounter: Payer: 59 | Admitting: Women's Health

## 2020-11-05 ENCOUNTER — Other Ambulatory Visit: Payer: 59

## 2020-11-05 ENCOUNTER — Encounter: Payer: Self-pay | Admitting: Women's Health

## 2020-11-05 VITALS — BP 136/87 | HR 104 | Wt 335.4 lb

## 2020-11-05 DIAGNOSIS — O24419 Gestational diabetes mellitus in pregnancy, unspecified control: Secondary | ICD-10-CM

## 2020-11-05 DIAGNOSIS — O0993 Supervision of high risk pregnancy, unspecified, third trimester: Secondary | ICD-10-CM

## 2020-11-05 DIAGNOSIS — O26643 Intrahepatic cholestasis of pregnancy, third trimester: Secondary | ICD-10-CM

## 2020-11-05 DIAGNOSIS — O26613 Liver and biliary tract disorders in pregnancy, third trimester: Secondary | ICD-10-CM

## 2020-11-05 DIAGNOSIS — Z3A34 34 weeks gestation of pregnancy: Secondary | ICD-10-CM

## 2020-11-05 DIAGNOSIS — O10919 Unspecified pre-existing hypertension complicating pregnancy, unspecified trimester: Secondary | ICD-10-CM

## 2020-11-05 DIAGNOSIS — K831 Obstruction of bile duct: Secondary | ICD-10-CM | POA: Diagnosis not present

## 2020-11-05 LAB — POCT URINALYSIS DIPSTICK OB
Blood, UA: NEGATIVE
Glucose, UA: NEGATIVE
Ketones, UA: NEGATIVE
Leukocytes, UA: NEGATIVE
Nitrite, UA: NEGATIVE

## 2020-11-05 NOTE — Progress Notes (Signed)
[place] 34+5 wks,cephalic,BPP 8/8,FHR 150 bpm,RI .52,.58,.54,=34%,AFI 18.5 cm,posterior placenta gr 3

## 2020-11-05 NOTE — Patient Instructions (Signed)
Bina, thank you for choosing our office today! We appreciate the opportunity to meet your healthcare needs. You may receive a short survey by mail, e-mail, or through MyChart. If you are happy with your care we would appreciate if you could take just a few minutes to complete the survey questions. We read all of your comments and take your feedback very seriously. Thank you again for choosing our office.  [facility] ([address] [place], [place] [address]) Entrance C, located off of [address] Free 24/7 valet parking   CLASSES: Go to [email or web address] to register for classes (childbirth, breastfeeding, waterbirth, infant CPR, daddy bootcamp, etc.)  Call the office ([phone]) or go to [facility] if: You begin to have strong, frequent contractions Your water breaks.  Sometimes it is a big gush of fluid, sometimes it is just a trickle that keeps getting your panties wet or running down your legs You have vaginal bleeding.  It is normal to have a small amount of spotting if your cervix was checked.  You don't feel your baby moving like normal.  If you don't, get you something to eat and drink and lay down and focus on feeling your baby move.   If your baby is still not moving like normal, you should call the office or go to [facility].  Call the office ([phone]) or go to [facility] for these signs of pre-eclampsia: Severe headache that does not go away with Tylenol Visual changes- seeing spots, double, blurred vision Pain under your right breast or upper abdomen that does not go away with Tums or heartburn medicine Nausea and/or vomiting Severe swelling in your hands, feet, and face   Tdap Vaccine It is recommended that you get the Tdap vaccine during the third trimester of EACH pregnancy to help protect your baby from getting pertussis (whooping cough) 27-36 weeks is the BEST time to do this  so that you can pass the protection on to your baby. During pregnancy is better than after pregnancy, but if you are unable to get it during pregnancy it will be offered at the hospital.  You can get this vaccine with us, at the health department, your family doctor, or some local pharmacies Everyone who will be around your baby should also be up-to-date on their vaccines before the baby comes. Adults (who are not pregnant) only need 1 dose of Tdap during adulthood.   [place] Pediatricians/Family Doctors [facility] ([facility]): [address], [phone]           [facility]: [address], [phone]                [facility] ([facility]): [address], [phone] (call to ask if accepting patients) [facility]: [address], [place], [phone]    [place] Pediatricians/Family Doctors [facility] ([facility]): [address], [phone] [facility]: [address], [phone] [facility]: [address], [phone]  [place] Family Doctors  [facility] ([facility]): [phone] [facility]: [address], [phone]   [place] Family Doctors [facility]: [address], [phone]  [place] Family [facility]  [facility]: [address], [phone]  Home Blood Pressure Monitoring for Patients   Your provider has recommended that you check your   blood pressure (BP) at least once a week at home. If you do not have a blood pressure cuff at home, one will be provided for you. Contact your provider if you have not received your monitor within 1 week.   Helpful Tips for Accurate Home Blood Pressure Checks  Don't smoke, exercise, or drink caffeine 30 minutes before checking your BP Use the restroom before checking your BP (a full bladder can raise your  pressure) Relax in a comfortable upright chair Feet on the ground Left arm resting comfortably on a flat surface at the level of your heart Legs uncrossed Back supported Sit quietly and don't talk Place the cuff on your bare arm Adjust snuggly, so that only two fingertips can fit between your skin and the top of the cuff Check 2 readings separated by at least one minute Keep a log of your BP readings For a visual, please reference this diagram: http://ccnc.care/bpdiagram  Provider Name: [facility]     Phone: [phone]  Zone 1: ALL CLEAR  Continue to monitor your symptoms:  BP reading is less than 140 (top number) or less than 90 (bottom number)  No right upper stomach pain No headaches or seeing spots No feeling nauseated or throwing up No swelling in face and hands  Zone 2: CAUTION Call your doctor's office for any of the following:  BP reading is greater than 140 (top number) or greater than 90 (bottom number)  Stomach pain under your ribs in the middle or right side Headaches or seeing spots Feeling nauseated or throwing up Swelling in face and hands  Zone 3: EMERGENCY  Seek immediate medical care if you have any of the following:  BP reading is greater than160 (top number) or greater than 110 (bottom number) Severe headaches not improving with Tylenol Serious difficulty catching your breath Any worsening symptoms from Zone 2  Preterm Labor and Birth Information  The normal length of a pregnancy is 39-41 weeks. Preterm labor is when labor starts before 37 completed weeks of pregnancy. What are the risk factors for preterm labor? Preterm labor is more likely to occur in women who: Have certain infections during pregnancy such as a bladder infection, sexually transmitted infection, or infection inside the uterus (chorioamnionitis). Have a shorter-than-normal cervix. Have gone into preterm labor before. Have had surgery on their cervix. Are younger than age 17  or older than age 35. Are African American. Are pregnant with twins or multiple babies (multiple gestation). Take street drugs or smoke while pregnant. Do not gain enough weight while pregnant. Became pregnant shortly after having been pregnant. What are the symptoms of preterm labor? Symptoms of preterm labor include: Cramps similar to those that can happen during a menstrual period. The cramps may happen with diarrhea. Pain in the abdomen or lower back. Regular uterine contractions that may feel like tightening of the abdomen. A feeling of increased pressure in the pelvis. Increased watery or bloody mucus discharge from the vagina. Water breaking (ruptured amniotic sac). Why is it important to recognize signs of preterm labor? It is important to recognize signs of preterm labor because babies who are born prematurely may not be fully developed. This can put them at an increased risk for: Long-term (chronic) heart and lung problems. Difficulty immediately after birth with regulating body systems, including blood sugar, body temperature, heart rate, and breathing rate. Bleeding in the brain. Cerebral palsy. Learning difficulties. Death. These risks are highest for babies who are born before 34 weeks   of pregnancy. How is preterm labor treated? Treatment depends on the length of your pregnancy, your condition, and the health of your baby. It may involve: Having a stitch (suture) placed in your cervix to prevent your cervix from opening too early (cerclage). Taking or being given medicines, such as: Hormone medicines. These may be given early in pregnancy to help support the pregnancy. Medicine to stop contractions. Medicines to help mature the baby's lungs. These may be prescribed if the risk of delivery is high. Medicines to prevent your baby from developing cerebral palsy. If the labor happens before 34 weeks of pregnancy, you may need to stay in the hospital. What should I do if I  think I am in preterm labor? If you think that you are going into preterm labor, call your health care provider right away. How can I prevent preterm labor in future pregnancies? To increase your chance of having a full-term pregnancy: Do not use any tobacco products, such as cigarettes, chewing tobacco, and e-cigarettes. If you need help quitting, ask your health care provider. Do not use street drugs or medicines that have not been prescribed to you during your pregnancy. Talk with your health care provider before taking any herbal supplements, even if you have been taking them regularly. Make sure you gain a healthy amount of weight during your pregnancy. Watch for infection. If you think that you might have an infection, get it checked right away. Make sure to tell your health care provider if you have gone into preterm labor before. This information is not intended to replace advice given to you by your health care provider. Make sure you discuss any questions you have with your health care provider. Document Revised: 05/04/2018 Document Reviewed: 06/03/2015 Elsevier Patient Education  2020 Elsevier Inc.   

## 2020-11-05 NOTE — Progress Notes (Signed)
HIGH-RISK PREGNANCY VISIT Patient name: Madison Mcgrath MRN [id number]  Date of birth: [birth year] Chief Complaint:   High Risk Gestation  History of Present Illness:   Madison Mcgrath is a 30 y.o. G1P0000 female at [redacted]w[redacted]d with an Estimated Date of Delivery: 12/12/20 being seen today for ongoing management of a high-risk pregnancy complicated by chronic hypertension currently on labetalol 100mg  BID and diabetes mellitus A2DM currently on metformin 500mg  AM/1,000mg  PM .    Today she reports  only 4 FBS >95 since last visit (9/29), no 2hr pp >120 . Has missed a couple of appts d/t transportation. Contractions: Irritability. Vag. Bleeding: None.  Movement: Present. denies leaking of fluid.   Depression screen [facility] 2/9 10/14/2020 07/29/2020 07/08/2020 06/18/2020  Decreased Interest 0 0 0 0  Down, Depressed, Hopeless 0 1 0 1  PHQ - 2 Score 0 1 0 1  Altered sleeping - - - 0  Tired, decreased energy - - - 1  Change in appetite - - - 1  Feeling bad or failure about yourself  - - - 0  Trouble concentrating - - - 0  Moving slowly or fidgety/restless - - - 0  Suicidal thoughts - - - 0  PHQ-9 Score - - - 3     GAD 7 : Generalized Anxiety Score 06/18/2020  Nervous, Anxious, on Edge 0  Control/stop worrying 1  Worry too much - different things 1  Trouble relaxing 0  Restless 0  Easily annoyed or irritable 1  Afraid - awful might happen 0  Total GAD 7 Score 3     Review of Systems:   Pertinent items are noted in HPI Denies abnormal vaginal discharge w/ itching/odor/irritation, headaches, visual changes, shortness of breath, chest pain, abdominal pain, severe nausea/vomiting, or problems with urination or bowel movements unless otherwise stated above. Pertinent History Reviewed:  Reviewed past medical,surgical, social, obstetrical and family history.  Reviewed problem list, medications and allergies. Physical Assessment:   Vitals:   11/05/20 1453  BP: 136/87  Pulse: (!) 104  Weight: (!) 335 lb 6.4 oz  (152.1 kg)  Body mass index is 55.81 kg/m.           Physical Examination:   General appearance: alert, well appearing, and in no distress  Mental status: alert, oriented to person, place, and time  Skin: warm & dry   Extremities: Edema: Trace    Cardiovascular: normal heart rate noted  Respiratory: normal respiratory effort, no distress  Abdomen: gravid, soft, non-tender  Pelvic: Cervical exam deferred         Fetal Status: Fetal Heart Rate (bpm): 150 u/s   Movement: Present    Fetal Surveillance Testing today: 06/20/2020 34+5 wks,cephalic,BPP 8/8,FHR 150 bpm,RI .52,.58,.54,=34%,AFI 18.5 cm,posterior placenta gr 3  Chaperone: N/A    Results for orders placed or performed in visit on 11/05/20 (from the past 24 hour(s))  POC Urinalysis Dipstick OB   Collection Time: 11/05/20  2:57 PM  Result Value Ref Range   Color, UA     Clarity, UA     Glucose, UA Negative Negative   Bilirubin, UA     Ketones, UA neg    Spec Grav, UA     Blood, UA neg    pH, UA     POC,PROTEIN,UA Trace Negative, Trace, Small (1+), Moderate (2+), Large (3+), 4+   Urobilinogen, UA     Nitrite, UA neg    Leukocytes, UA Negative Negative   Appearance  Odor      Assessment & Plan:  High-risk pregnancy: G1P0000 at [redacted]w[redacted]d with an Estimated Date of Delivery: 12/12/20   1) CHTN, stable on Labetalol 100mg  BID  2) A2/BDM, metformin 500mg  AM/1,000mg  PM, EFW 68% @ 31.5wk  Meds: No orders of the defined types were placed in this encounter.   Labs/procedures today: U/S  Treatment Plan:  Growth u/s q 4wks    2x/wk testing nst/sono   Deliver 38-39wks (37wks or prn if poor control)____   Reviewed: Preterm labor symptoms and general obstetric precautions including but not limited to vaginal bleeding, contractions, leaking of fluid and fetal movement were reviewed in detail with the patient.  All questions were answered. Does have home bp cuff. Office bp cuff given: not applicable. Check bp weekly, let know if  consistently >140 and/or >90.  Follow-up: Return for As scheduled.   Future Appointments  Date Time Provider Department Center  11/09/2020 10:50 AM [facility] NURSE [facility]  11/12/2020 10:30 AM [facility] 11/11/2020 [facility] None  11/12/2020 11:15 AM [place], DO [facility]  11/16/2020  2:30 PM [facility] NURSE [facility]  11/19/2020  2:15 PM [facility] 11/18/2020 [facility] None  11/19/2020  3:10 PM [place], DO [facility]  11/26/2020  2:00 PM [facility] Myna Hidalgo [facility] None  11/26/2020  3:10 PM [place], MD [facility]  11/30/2020  2:30 PM [facility] NURSE [facility]  12/03/2020  2:00 PM [facility] 13/07/2020 [facility] None  12/03/2020  2:50 PM [place], DO [facility]  12/07/2020  2:30 PM [facility] NURSE [facility]  12/10/2020  2:00 PM [facility] 12/09/2020 [facility] None  12/10/2020  2:50 PM [place], MD [facility]  01/29/2021 10:00 AM Lazaro Arms, NP [facility]  10/18/2021 11:00 AM [facility] NURSE ADVISOR [facility]    Orders Placed This Encounter  Procedures   POC Urinalysis Dipstick OB   [facility] CNM, [facility] 11/05/2020 3:45 PM

## 2020-11-09 ENCOUNTER — Ambulatory Visit (INDEPENDENT_AMBULATORY_CARE_PROVIDER_SITE_OTHER): Payer: 59 | Admitting: *Deleted

## 2020-11-09 ENCOUNTER — Other Ambulatory Visit: Payer: Self-pay

## 2020-11-09 ENCOUNTER — Other Ambulatory Visit: Payer: 59

## 2020-11-09 VITALS — BP 131/84 | HR 99 | Wt 334.0 lb

## 2020-11-09 DIAGNOSIS — O10919 Unspecified pre-existing hypertension complicating pregnancy, unspecified trimester: Secondary | ICD-10-CM

## 2020-11-09 DIAGNOSIS — O0993 Supervision of high risk pregnancy, unspecified, third trimester: Secondary | ICD-10-CM

## 2020-11-09 DIAGNOSIS — Z331 Pregnant state, incidental: Secondary | ICD-10-CM

## 2020-11-09 DIAGNOSIS — O24415 Gestational diabetes mellitus in pregnancy, controlled by oral hypoglycemic drugs: Secondary | ICD-10-CM

## 2020-11-09 DIAGNOSIS — Z1389 Encounter for screening for other disorder: Secondary | ICD-10-CM

## 2020-11-09 DIAGNOSIS — O288 Other abnormal findings on antenatal screening of mother: Secondary | ICD-10-CM

## 2020-11-09 LAB — POCT URINALYSIS DIPSTICK OB
Blood, UA: NEGATIVE
Glucose, UA: NEGATIVE
Leukocytes, UA: NEGATIVE
Nitrite, UA: NEGATIVE

## 2020-11-09 NOTE — Progress Notes (Addendum)
   NURSE VISIT- NST  SUBJECTIVE:  Madison Mcgrath is a 30 y.o. G1P0000 female at [redacted]w[redacted]d, here for a NST for pregnancy complicated by [facility] and A2/BDM currently on Metformin .  She reports active fetal movement, contractions: none, vaginal bleeding: none, membranes: intact.   OBJECTIVE:  BP 131/84   Pulse 99   Wt (!) 334 lb (151.5 kg)   LMP  (LMP Unknown)   BMI 55.58 kg/m   Appears well, no apparent distress  Results for orders placed or performed in visit on 11/09/20 (from the past 24 hour(s))  POC Urinalysis Dipstick OB   Collection Time: 11/09/20 11:25 AM  Result Value Ref Range   Color, UA     Clarity, UA     Glucose, UA Negative Negative   Bilirubin, UA     Ketones, UA trace    Spec Grav, UA     Blood, UA neg    pH, UA     POC,PROTEIN,UA Small (1+) Negative, Trace, Small (1+), Moderate (2+), Large (3+), 4+   Urobilinogen, UA     Nitrite, UA neg    Leukocytes, UA Negative Negative   Appearance     Odor      NST: FHR baseline 150 bpm, Variability: moderate, Accelerations:present, Decelerations:  Absent= Cat 1/reactive Toco: none   ASSESSMENT: G1P0000 at [redacted]w[redacted]d with CHTN and A2/BDM currently on Metformin NST reactive  PLAN: EFM strip reviewed by Joellyn Haff, CNM, [facility]   Recommendations: keep next appointment as scheduled    Jobe Marker  11/09/2020 12:32 PM  Chart reviewed for nurse visit. Agree with plan of care.  Cheral Marker, [place] 11/10/2020 1:12 PM

## 2020-11-11 ENCOUNTER — Other Ambulatory Visit: Payer: Self-pay | Admitting: Women's Health

## 2020-11-11 DIAGNOSIS — O10919 Unspecified pre-existing hypertension complicating pregnancy, unspecified trimester: Secondary | ICD-10-CM

## 2020-11-11 DIAGNOSIS — O24419 Gestational diabetes mellitus in pregnancy, unspecified control: Secondary | ICD-10-CM

## 2020-11-12 ENCOUNTER — Other Ambulatory Visit: Payer: 59

## 2020-11-12 ENCOUNTER — Telehealth: Payer: Self-pay | Admitting: Obstetrics & Gynecology

## 2020-11-12 ENCOUNTER — Encounter: Payer: 59 | Admitting: Obstetrics & Gynecology

## 2020-11-12 NOTE — Telephone Encounter (Signed)
Left detailed message encouraging pt to come regularly for her visit.  Looking forward to seeing her on Monday

## 2020-11-13 ENCOUNTER — Telehealth: Payer: Self-pay | Admitting: Women's Health

## 2020-11-13 NOTE — Telephone Encounter (Signed)
Returned patient's call.  States she wants to know if she can start pumping to store up some breastmilk before the baby is born.  Advised patient to not pump before delivery as this can cause contractions and she is still considered preterm.  Informed she will have time to store up milk one the baby is born. Pt verbalized understanding with no further questions.

## 2020-11-13 NOTE — Telephone Encounter (Signed)
Pt wants to know when can she start using her breast pump  Please advise & notify pt

## 2020-11-16 ENCOUNTER — Other Ambulatory Visit: Payer: Self-pay

## 2020-11-16 ENCOUNTER — Ambulatory Visit (INDEPENDENT_AMBULATORY_CARE_PROVIDER_SITE_OTHER): Payer: 59 | Admitting: *Deleted

## 2020-11-16 ENCOUNTER — Other Ambulatory Visit: Payer: 59

## 2020-11-16 VITALS — BP 133/86 | HR 99 | Wt 336.2 lb

## 2020-11-16 DIAGNOSIS — O10919 Unspecified pre-existing hypertension complicating pregnancy, unspecified trimester: Secondary | ICD-10-CM

## 2020-11-16 DIAGNOSIS — O24415 Gestational diabetes mellitus in pregnancy, controlled by oral hypoglycemic drugs: Secondary | ICD-10-CM

## 2020-11-16 DIAGNOSIS — Z1389 Encounter for screening for other disorder: Secondary | ICD-10-CM

## 2020-11-16 DIAGNOSIS — O0993 Supervision of high risk pregnancy, unspecified, third trimester: Secondary | ICD-10-CM

## 2020-11-16 DIAGNOSIS — O288 Other abnormal findings on antenatal screening of mother: Secondary | ICD-10-CM

## 2020-11-16 DIAGNOSIS — Z331 Pregnant state, incidental: Secondary | ICD-10-CM

## 2020-11-16 LAB — POCT URINALYSIS DIPSTICK OB
Blood, UA: NEGATIVE
Glucose, UA: NEGATIVE
Ketones, UA: NEGATIVE
Leukocytes, UA: NEGATIVE
Nitrite, UA: NEGATIVE

## 2020-11-16 NOTE — Progress Notes (Addendum)
   NURSE VISIT- NST  SUBJECTIVE:  Madison Mcgrath is a 30 y.o. G1P0000 female at [redacted]w[redacted]d, here for a NST for pregnancy complicated by [facility] and A2/BDM currently on Metformin .  She reports active fetal movement, contractions: none, vaginal bleeding: none, membranes: intact.   OBJECTIVE:  BP 133/86   Pulse 99   Wt (!) 336 lb 3.2 oz (152.5 kg)   LMP  (LMP Unknown)   BMI 55.95 kg/m   Appears well, no apparent distress  Results for orders placed or performed in visit on 11/16/20 (from the past 24 hour(s))  POC Urinalysis Dipstick OB   Collection Time: 11/16/20 10:16 AM  Result Value Ref Range   Color, UA     Clarity, UA     Glucose, UA Negative Negative   Bilirubin, UA     Ketones, UA neg    Spec Grav, UA     Blood, UA neg    pH, UA     POC,PROTEIN,UA Trace Negative, Trace, Small (1+), Moderate (2+), Large (3+), 4+   Urobilinogen, UA     Nitrite, UA neg    Leukocytes, UA Negative Negative   Appearance     Odor      NST: FHR baseline 140 bpm, Variability: moderate, Accelerations:present, Decelerations:  Absent= Cat 1/reactive Toco: none   ASSESSMENT: G1P0000 at [redacted]w[redacted]d with 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 NST reactive  PLAN: EFM strip reviewed by Joellyn Haff, CNM, [facility]   Recommendations: keep next appointment as scheduled    Jobe Marker  11/16/2020 12:05 PM  Chart reviewed for nurse visit. Agree with plan of care.  Cheral Marker, [place] 11/16/2020 12:52 PM

## 2020-11-19 ENCOUNTER — Ambulatory Visit (INDEPENDENT_AMBULATORY_CARE_PROVIDER_SITE_OTHER): Payer: 59 | Admitting: Obstetrics & Gynecology

## 2020-11-19 ENCOUNTER — Other Ambulatory Visit: Payer: Self-pay

## 2020-11-19 ENCOUNTER — Encounter: Payer: 59 | Admitting: Obstetrics & Gynecology

## 2020-11-19 ENCOUNTER — Other Ambulatory Visit: Payer: 59

## 2020-11-19 ENCOUNTER — Other Ambulatory Visit (HOSPITAL_COMMUNITY)
Admission: RE | Admit: 2020-11-19 | Discharge: 2020-11-19 | Disposition: A | Discharge status: Home / Self Care | Payer: 59 | Source: Ambulatory Visit | Attending: Obstetrics & Gynecology | Admitting: Obstetrics & Gynecology

## 2020-11-19 ENCOUNTER — Ambulatory Visit (INDEPENDENT_AMBULATORY_CARE_PROVIDER_SITE_OTHER): Payer: 59

## 2020-11-19 VITALS — BP 138/70 | HR 94 | Wt 335.4 lb

## 2020-11-19 DIAGNOSIS — O24419 Gestational diabetes mellitus in pregnancy, unspecified control: Secondary | ICD-10-CM | POA: Diagnosis not present

## 2020-11-19 DIAGNOSIS — O0993 Supervision of high risk pregnancy, unspecified, third trimester: Secondary | ICD-10-CM | POA: Insufficient documentation

## 2020-11-19 DIAGNOSIS — O10919 Unspecified pre-existing hypertension complicating pregnancy, unspecified trimester: Secondary | ICD-10-CM

## 2020-11-19 DIAGNOSIS — Z3A36 36 weeks gestation of pregnancy: Secondary | ICD-10-CM | POA: Insufficient documentation

## 2020-11-19 LAB — POCT URINALYSIS DIPSTICK OB
Blood, UA: NEGATIVE
Glucose, UA: NEGATIVE
Ketones, UA: NEGATIVE
Leukocytes, UA: NEGATIVE
Nitrite, UA: NEGATIVE

## 2020-11-19 LAB — OB RESULTS CONSOLE GC/CHLAMYDIA: Gonorrhea: NEGATIVE

## 2020-11-19 NOTE — Progress Notes (Signed)
HIGH-RISK PREGNANCY VISIT Patient name: Madison Mcgrath MRN [id number]  Date of birth: [birth year] Chief Complaint:   Routine Prenatal Visit, High Risk Gestation, and Pregnancy Ultrasound  History of Present Illness:   Madison Mcgrath is a 30 y.o. G90P0000 female at 57w5dwith an Estimated Date of Delivery: 12/12/20 being seen today for ongoing management of a high-risk pregnancy complicated by: -chronic hypertension currently on labetalol 103mBID  -GDMA2- metformin 50028mM/1,000m47m  Did not bring log, per pt that have been controlled  Today she reports  menstrual cramping this am, not sure how often .   Contractions: Irritability. Vag. Bleeding: None.  Movement: Present. denies leaking of fluid.   Depression screen PHQ [facility] 10/14/2020 07/29/2020 07/08/2020 06/18/2020  Decreased Interest 0 0 0 0  Down, Depressed, Hopeless 0 1 0 1  PHQ - 2 Score 0 1 0 1  Altered sleeping - - - 0  Tired, decreased energy - - - 1  Change in appetite - - - 1  Feeling bad or failure about yourself  - - - 0  Trouble concentrating - - - 0  Moving slowly or fidgety/restless - - - 0  Suicidal thoughts - - - 0  PHQ-9 Score - - - 3     Current Outpatient Medications  Medication Instructions   aspirin 162 mg, Oral, Daily, Swallow whole.   Blood Glucose Monitoring Suppl (ONETOUCH VERIO) w/Device KIT Use as directed to check blood sugar 4 times daily   famotidine (PEPCID) 20 mg, Oral, 2 times daily   glucose blood test strip Use as instructed   labetalol (NORMODYNE) 100 MG tablet TAKE 1 TABLET(100 MG) BY MOUTH TWICE DAILY   lamoTRIgine (LAMICTAL) 150 MG tablet 1 tablet, Oral, Daily   Lancets (ONETOUCH DELICA PLUS [id number] Use to check blood sugar 4 times daily   levothyroxine (SYNTHROID) 88 mcg, Oral, Every morning   metFORMIN (GLUCOPHAGE) 500 MG tablet Oral, 2 times daily with meals, Takes 500 mg in the am and 1000 mg at night   omeprazole (PRILOSEC OTC) 20 mg, Oral, Daily   Prenatal Vit-Fe Fumarate-FA  (PRENATAL VITAMIN) 27-0.8 MG TABS 1 tablet, Oral, Every morning     Review of Systems:   Pertinent items are noted in HPI Denies abnormal vaginal discharge w/ itching/odor/irritation, headaches, visual changes, shortness of breath, chest pain, abdominal pain, severe nausea/vomiting, or problems with urination or bowel movements unless otherwise stated above. Pertinent History Reviewed:  Reviewed past medical,surgical, social, obstetrical and family history.  Reviewed problem list, medications and allergies. Physical Assessment:   Vitals:   11/19/20 0951  BP: 138/70  Pulse: 94  Weight: (!) 335 lb 6.4 oz (152.1 kg)  Body mass index is 55.81 kg/m.           Physical Examination:   General appearance: alert, well appearing, and in no distress  Mental status: alert, oriented to person, place, and time  Skin: warm & dry   Extremities: Edema: Trace    Cardiovascular: normal heart rate noted  Respiratory: normal respiratory effort, no distress  Abdomen: gravid, soft, non-tender  Pelvic: Cervical exam performed  Dilation: Closed Effacement (%): Thick Station: Ballotable  Fetal Status:     Movement: Present    Fetal Surveillance Testing today: cephalic,fhr 157 881,posterior placenta gr 3,AFI 14.4 cm,RI .45,.43,.45,.47=7.6%,BPP 8/8,EFW 3590 g 94%   Chaperone: AngeEchoStarResults for orders placed or performed in visit on 11/19/20 (from the past 24 hour(s))  POC Urinalysis  Dipstick OB   Collection Time: 11/19/20  9:54 AM  Result Value Ref Range   Color, UA     Clarity, UA     Glucose, UA Negative Negative   Bilirubin, UA     Ketones, UA neg    Spec Grav, UA     Blood, UA neg    pH, UA     POC,PROTEIN,UA Trace Negative, Trace, Small (1+), Moderate (2+), Large (3+), 4+   Urobilinogen, UA     Nitrite, UA neg    Leukocytes, UA Negative Negative   Appearance     Odor       Assessment & Plan:  High-risk pregnancy: G1P0000 at 86w5dwith an Estimated Date of Delivery: 12/12/20    1) cHTN- well controlled to Labetalol  2) GDMA2- encouraged pt to bring log to every visit -BPP/Growth today as above -continue twice weekly testing []  plan for IOL 39wk  Meds: No orders of the defined types were placed in this encounter.   Labs/procedures today: BPP, GBS, GC/C collected today  Treatment Plan:  continue plan as outlined above  Reviewed: Preterm labor symptoms and general obstetric precautions including but not limited to vaginal bleeding, contractions, leaking of fluid and fetal movement were reviewed in detail with the patient.  All questions were answered. Pt has home bp cuff. Check bp weekly, let u[place]ore[place]now if >140/90.   Follow-up: as below   Future Appointments  Date Time Provider [place] 11/26/2020 10:45 AM [facility] - FTOBGYN [place]-FTIMG None  11/26/2020 11:30 AM CChristin Fudge CNM [facility]-FT FTOBGYN  11/30/2020 11:10 AM [facility]-FTOBGYN NURSE [facility] FTOBGYN  12/03/2020  2:00 PM [place]- FTOBGYN [place]WH-FTIMG None  12/03/2020  2:50 PM OJanyth Pupa DO [facility]-FT FTOBGYN  01/29/2021 10:00 AM [facility] Larsson NP [facility] 10/18/2021 11:00 AM R[facility]-HEALTH NURSE ADVISOR [facility] RPC    Orders Placed This Encounter  Procedures   Strep Gp B NAA   POC Urinalysis Dipstick OB    JJanyth Pupa DO Attending [place] FBay Cityfor WDean Foods Company CKirklinGroup

## 2020-11-19 NOTE — Progress Notes (Signed)
[place] 36+5 wks,cephalic,fhr 157 bpm,posterior placenta gr 3,AFI 14.4 cm,RI .45,.43,.45,.47=7.6%,BPP 8/8,EFW 3590 g 94%

## 2020-11-20 LAB — CERVICOVAGINAL ANCILLARY ONLY
Chlamydia: NEGATIVE
Comment: NEGATIVE
Comment: NORMAL
Neisseria Gonorrhea: NEGATIVE

## 2020-11-21 LAB — STREP GP B NAA: Strep Gp B NAA: POSITIVE — AB

## 2020-11-25 ENCOUNTER — Other Ambulatory Visit: Payer: Self-pay | Admitting: Obstetrics & Gynecology

## 2020-11-25 DIAGNOSIS — O10919 Unspecified pre-existing hypertension complicating pregnancy, unspecified trimester: Secondary | ICD-10-CM

## 2020-11-25 DIAGNOSIS — O24419 Gestational diabetes mellitus in pregnancy, unspecified control: Secondary | ICD-10-CM

## 2020-11-26 ENCOUNTER — Other Ambulatory Visit: Payer: 59

## 2020-11-26 ENCOUNTER — Encounter: Payer: 59 | Admitting: Obstetrics & Gynecology

## 2020-11-26 ENCOUNTER — Other Ambulatory Visit: Payer: Self-pay

## 2020-11-26 ENCOUNTER — Ambulatory Visit (INDEPENDENT_AMBULATORY_CARE_PROVIDER_SITE_OTHER): Payer: 59 | Admitting: Advanced Practice Midwife

## 2020-11-26 ENCOUNTER — Ambulatory Visit (INDEPENDENT_AMBULATORY_CARE_PROVIDER_SITE_OTHER): Payer: 59

## 2020-11-26 VITALS — BP 118/78 | HR 98 | Wt 336.8 lb

## 2020-11-26 DIAGNOSIS — O10919 Unspecified pre-existing hypertension complicating pregnancy, unspecified trimester: Secondary | ICD-10-CM

## 2020-11-26 DIAGNOSIS — Z3A37 37 weeks gestation of pregnancy: Secondary | ICD-10-CM | POA: Diagnosis not present

## 2020-11-26 DIAGNOSIS — O0992 Supervision of high risk pregnancy, unspecified, second trimester: Secondary | ICD-10-CM

## 2020-11-26 DIAGNOSIS — O24419 Gestational diabetes mellitus in pregnancy, unspecified control: Secondary | ICD-10-CM | POA: Diagnosis not present

## 2020-11-26 DIAGNOSIS — O0993 Supervision of high risk pregnancy, unspecified, third trimester: Secondary | ICD-10-CM

## 2020-11-26 LAB — POCT URINALYSIS DIPSTICK OB
Blood, UA: NEGATIVE
Glucose, UA: NEGATIVE
Nitrite, UA: NEGATIVE

## 2020-11-26 NOTE — Progress Notes (Signed)
HIGH-RISK PREGNANCY VISIT Patient name: Madison Mcgrath MRN [id number]  Date of birth: [birth year] Chief Complaint:   Routine Prenatal Visit  History of Present Illness:   Madison Mcgrath is a 30 y.o. G52P0000 female at [redacted]w[redacted]d with an Estimated Date of Delivery: 12/12/20 being seen today for ongoing management of a high-risk pregnancy complicated by high-risk pregnancy complicated by: -chronic hypertension currently on labetalol 100mg  BID  -GDMA2- metformin 500mg  AM/1,000mg  PM - Today she reports no complaints. Didn't bring BS log but says 100% of BS are in range. Contractions: Not present. Vag. Bleeding: None.  Movement: (!) Decreased. denies leaking of fluid.  Review of Systems:   Pertinent items are noted in HPI Denies abnormal vaginal discharge w/ itching/odor/irritation, headaches, visual changes, shortness of breath, chest pain, abdominal pain, severe nausea/vomiting, or problems with urination or bowel movements unless otherwise stated above. Pertinent History Reviewed:  Reviewed past medical,surgical, social, obstetrical and family history.  Reviewed problem list, medications and allergies. Physical Assessment:   Vitals:   11/26/20 1155  BP: 118/78  Pulse: 98  Weight: (!) 336 lb 12.8 oz (152.8 kg)  Body mass index is 56.05 kg/m.           Physical Examination:   General appearance: alert, well appearing, and in no distress  Mental status: alert, oriented to person, place, and time  Skin: warm & dry   Extremities: Edema: Trace    Cardiovascular: normal heart rate noted  Respiratory: normal respiratory effort, no distress  Abdomen: gravid, soft, non-tender  Pelvic: Cervical exam performed  Dilation: Closed Effacement (%): Thick Station: Ballotable  Fetal Status: Fetal Heart Rate (bpm):   Movement: (!) Decreased Presentation: Vertex Has been decreased throughout pregnancy.  Pt sees movement on 13/03/22 but doesn' t feel it.   Fetal Surveillance Testing today: [place] 37+5 wks,cephalic,BPP  8/8,FHR 146 BPM,posterior placenta gr 3,AFI 15.3 cm,RI .43,.51=22%,limited view    Results for orders placed or performed in visit on 11/26/20 (from the past 24 hour(s))  POC Urinalysis Dipstick OB   Collection Time: 11/26/20 11:47 AM  Result Value Ref Range   Color, UA     Clarity, UA     Glucose, UA Negative Negative   Bilirubin, UA     Ketones, UA small    Spec Grav, UA     Blood, UA neg    pH, UA     POC,PROTEIN,UA Trace Negative, Trace, Small (1+), Moderate (2+), Large (3+), 4+   Urobilinogen, UA     Nitrite, UA neg    Leukocytes, UA Trace (A) Negative   Appearance     Odor      Assessment & Plan:  1) High-risk pregnancy G1P0000 at [redacted]w[redacted]d with an Estimated Date of Delivery: 12/12/20   2) Class B DM, stable Treatment plan  continue QID BS, BRING LOG!!  3) CHTN,   Treatment plan twice weekly testing, plan IOL 39 weeks  Meds: No orders of the defined types were placed in this encounter.   Labs/procedures today:BPP/dopplers   Reviewed: Term labor symptoms and general obstetric precautions including but not limited to vaginal bleeding, contractions, leaking of fluid and fetal movement were reviewed in detail with the patient.  All questions were answered. .   Follow-up: No follow-ups on file.  Future Appointments  Date Time Provider Department Center  11/30/2020 11:10 AM [facility]-FTOBGYN NURSE [facility] FTOBGYN  12/03/2020  2:00 PM [facility] - FTOBGYN 13/07/2020 [facility]-FTIMG None  12/03/2020  2:50 PM [place], DO [facility]-FT  FTOBGYN  01/29/2021 10:00 AM Heather Roberts, NP [facility]  10/18/2021 11:00 AM R[facility]-HEALTH NURSE ADVISOR [facility]RPC RPC    Orders Placed This Encounter  Procedures   POC Urinalysis Dipstick OB   Jacklyn Shell DNP, CNM 11/26/2020 9:32 PM

## 2020-11-26 NOTE — Progress Notes (Signed)
[place] 37+5 wks,cephalic,BPP 8/8,FHR 146 BPM,posterior placenta gr 3,AFI 15.3 cm,RI .43,.51=22%,limited view

## 2020-11-30 ENCOUNTER — Other Ambulatory Visit: Payer: 59

## 2020-12-01 ENCOUNTER — Telehealth: Payer: Self-pay | Admitting: *Deleted

## 2020-12-01 ENCOUNTER — Other Ambulatory Visit: Payer: Self-pay | Admitting: Obstetrics & Gynecology

## 2020-12-01 NOTE — Telephone Encounter (Signed)
Patient cancelled appointments for Thursday.  Induction not scheduled.  Dr Despina Hidden to schedule and place orders.

## 2020-12-01 NOTE — Treatment Plan (Signed)
   Induction Assessment Scheduling Form: Fax to [facility]:  [phone]  Madison Mcgrath                                                                                   DOB:  02/28/1990                                                            MRN:  [id number]                                                                     Phone #:   [phone]                         Provider:  [facility]  GP:  G1P0000                                                            Estimated Date of Delivery: 12/12/20  Dating Criteria: early sonogram    Medical Indications for induction:  CHTN A2DM Admission Date/Time:  12/05/20 AM Gestational age on admission:  [redacted]w[redacted]d   There were no vitals filed for this visit. HIV:  Non Reactive (09/15 1126) GBS: Positive/-- (10/27 1146)  Cervical exam not done   Method of induction(proposed):  choice   Scheduling Provider Signature:  Lazaro Arms, MD                                            Today's Date:  12/01/2020

## 2020-12-02 ENCOUNTER — Other Ambulatory Visit: Payer: Self-pay | Admitting: Advanced Practice Midwife

## 2020-12-03 ENCOUNTER — Encounter: Payer: 59 | Admitting: Obstetrics & Gynecology

## 2020-12-03 ENCOUNTER — Other Ambulatory Visit: Payer: 59

## 2020-12-03 ENCOUNTER — Telehealth: Payer: Self-pay | Admitting: *Deleted

## 2020-12-03 NOTE — Telephone Encounter (Signed)
Pt called and stated that she was having some pains in her side that felt like contractions. They are coming and going. I advised that she time them. Pt had not done this previously because she had just woke up. Advised that if baby isn't moving good, she has bleeding like a period, leaking fluid or regular contractions less than 5 min apart lasting for more than an hour to go to MAU.

## 2020-12-05 ENCOUNTER — Encounter (HOSPITAL_COMMUNITY): Payer: Self-pay | Admitting: Obstetrics & Gynecology

## 2020-12-05 ENCOUNTER — Other Ambulatory Visit: Payer: Self-pay

## 2020-12-05 ENCOUNTER — Inpatient Hospital Stay (HOSPITAL_COMMUNITY): Payer: 59

## 2020-12-05 ENCOUNTER — Inpatient Hospital Stay (HOSPITAL_COMMUNITY)
Admission: AD | Admit: 2020-12-05 | Discharge: 2020-12-09 | DRG: 807 | Disposition: A | Discharge status: Home / Self Care | Payer: 59 | Attending: Obstetrics & Gynecology | Admitting: Obstetrics & Gynecology

## 2020-12-05 DIAGNOSIS — O99214 Obesity complicating childbirth: Secondary | ICD-10-CM | POA: Diagnosis present

## 2020-12-05 DIAGNOSIS — E039 Hypothyroidism, unspecified: Secondary | ICD-10-CM | POA: Diagnosis present

## 2020-12-05 DIAGNOSIS — O10919 Unspecified pre-existing hypertension complicating pregnancy, unspecified trimester: Secondary | ICD-10-CM | POA: Diagnosis present

## 2020-12-05 DIAGNOSIS — O24425 Gestational diabetes mellitus in childbirth, controlled by oral hypoglycemic drugs: Secondary | ICD-10-CM | POA: Diagnosis present

## 2020-12-05 DIAGNOSIS — Z7982 Long term (current) use of aspirin: Secondary | ICD-10-CM

## 2020-12-05 DIAGNOSIS — Z8632 Personal history of gestational diabetes: Secondary | ICD-10-CM | POA: Diagnosis present

## 2020-12-05 DIAGNOSIS — Z3A39 39 weeks gestation of pregnancy: Secondary | ICD-10-CM | POA: Diagnosis not present

## 2020-12-05 DIAGNOSIS — F909 Attention-deficit hyperactivity disorder, unspecified type: Secondary | ICD-10-CM | POA: Diagnosis present

## 2020-12-05 DIAGNOSIS — O99284 Endocrine, nutritional and metabolic diseases complicating childbirth: Secondary | ICD-10-CM | POA: Diagnosis present

## 2020-12-05 DIAGNOSIS — I10 Essential (primary) hypertension: Secondary | ICD-10-CM | POA: Diagnosis present

## 2020-12-05 DIAGNOSIS — O24419 Gestational diabetes mellitus in pregnancy, unspecified control: Secondary | ICD-10-CM | POA: Diagnosis present

## 2020-12-05 DIAGNOSIS — O1002 Pre-existing essential hypertension complicating childbirth: Principal | ICD-10-CM | POA: Diagnosis present

## 2020-12-05 DIAGNOSIS — Z23 Encounter for immunization: Secondary | ICD-10-CM | POA: Diagnosis present

## 2020-12-05 DIAGNOSIS — F319 Bipolar disorder, unspecified: Secondary | ICD-10-CM | POA: Diagnosis present

## 2020-12-05 DIAGNOSIS — O9962 Diseases of the digestive system complicating childbirth: Secondary | ICD-10-CM | POA: Diagnosis present

## 2020-12-05 DIAGNOSIS — O99824 Streptococcus B carrier state complicating childbirth: Secondary | ICD-10-CM | POA: Diagnosis present

## 2020-12-05 DIAGNOSIS — Z20822 Contact with and (suspected) exposure to covid-19: Secondary | ICD-10-CM | POA: Diagnosis present

## 2020-12-05 DIAGNOSIS — O99820 Streptococcus B carrier state complicating pregnancy: Secondary | ICD-10-CM | POA: Diagnosis not present

## 2020-12-05 DIAGNOSIS — K219 Gastro-esophageal reflux disease without esophagitis: Secondary | ICD-10-CM | POA: Diagnosis present

## 2020-12-05 DIAGNOSIS — F419 Anxiety disorder, unspecified: Secondary | ICD-10-CM | POA: Diagnosis present

## 2020-12-05 DIAGNOSIS — O24424 Gestational diabetes mellitus in childbirth, insulin controlled: Secondary | ICD-10-CM | POA: Diagnosis not present

## 2020-12-05 DIAGNOSIS — O99344 Other mental disorders complicating childbirth: Secondary | ICD-10-CM | POA: Diagnosis present

## 2020-12-05 LAB — CBC
HCT: 40.5 % (ref 36.0–46.0)
HCT: 37.9 % (ref 36.0–46.0)
Hemoglobin: 13 g/dL (ref 12.0–15.0)
Hemoglobin: 12.3 g/dL (ref 12.0–15.0)
MCH: 26 pg (ref 26.0–34.0)
MCH: 26.3 pg (ref 26.0–34.0)
MCHC: 32.1 g/dL (ref 30.0–36.0)
MCHC: 32.5 g/dL (ref 30.0–36.0)
MCV: 81 fL (ref 80.0–100.0)
MCV: 81.2 fL (ref 80.0–100.0)
Platelets: 256 10*3/uL (ref 150–400)
Platelets: 243 10*3/uL (ref 150–400)
RBC: 5 MIL/uL (ref 3.87–5.11)
RBC: 4.67 MIL/uL (ref 3.87–5.11)
RDW: 15.3 % (ref 11.5–15.5)
RDW: 15.3 % (ref 11.5–15.5)
WBC: 11.5 10*3/uL — ABNORMAL HIGH (ref 4.0–10.5)
WBC: 14.2 10*3/uL — ABNORMAL HIGH (ref 4.0–10.5)
nRBC: 0 % (ref 0.0–0.2)
nRBC: 0 % (ref 0.0–0.2)

## 2020-12-05 LAB — COMPREHENSIVE METABOLIC PANEL
ALT: 13 U/L (ref 0–44)
AST: 21 U/L (ref 15–41)
Albumin: 2.6 g/dL — ABNORMAL LOW (ref 3.5–5.0)
Alkaline Phosphatase: 122 U/L (ref 38–126)
Anion gap: 11 (ref 5–15)
BUN: 6 mg/dL (ref 6–20)
CO2: 20 mmol/L — ABNORMAL LOW (ref 22–32)
Calcium: 9.4 mg/dL (ref 8.9–10.3)
Chloride: 105 mmol/L (ref 98–111)
Creatinine, Ser: 0.64 mg/dL (ref 0.44–1.00)
GFR, Estimated: >60 mL/min (ref >60)
Glucose, Bld: 162 mg/dL — ABNORMAL HIGH (ref 70–99)
Potassium: 3.9 mmol/L (ref 3.5–5.1)
Sodium: 136 mmol/L (ref 135–145)
Total Bilirubin: 0.4 mg/dL (ref 0.3–1.2)
Total Protein: 6.4 g/dL — ABNORMAL LOW (ref 6.5–8.1)

## 2020-12-05 LAB — PROTEIN / CREATININE RATIO, URINE
Creatinine, Urine: 212.39 mg/dL
Protein Creatinine Ratio: 0.12 mg/mg{Cre} (ref 0.00–0.15)
Total Protein, Urine: 25 mg/dL

## 2020-12-05 LAB — GLUCOSE, CAPILLARY
Glucose-Capillary: 126 mg/dL — ABNORMAL HIGH (ref 70–99)
Glucose-Capillary: 112 mg/dL — ABNORMAL HIGH (ref 70–99)
Glucose-Capillary: 105 mg/dL — ABNORMAL HIGH (ref 70–99)
Glucose-Capillary: 125 mg/dL — ABNORMAL HIGH (ref 70–99)

## 2020-12-05 LAB — RAPID HIV SCREEN (HIV 1/2 AB+AG)
HIV 1/2 Antibodies: NONREACTIVE
HIV-1 P24 Antigen - HIV24: NONREACTIVE

## 2020-12-05 LAB — RESP PANEL BY RT-PCR (FLU A&B, COVID) ARPGX2
Influenza A by PCR: NEGATIVE
Influenza B by PCR: NEGATIVE
SARS Coronavirus 2 by RT PCR: NEGATIVE

## 2020-12-05 LAB — TYPE AND SCREEN
ABO/RH(D): O POS
Antibody Screen: NEGATIVE

## 2020-12-05 MED ORDER — LEVOTHYROXINE SODIUM 88 MCG PO TABS
88.0000 ug | ORAL_TABLET | Freq: Every morning | ORAL | Status: DC
  Administered 2020-12-06 – 2020-12-09 (×4): 88 ug via ORAL
  Filled 2020-12-05 (×5): qty 1

## 2020-12-05 MED ORDER — PHENYLEPHRINE 40 MCG/ML (10ML) SYRINGE FOR IV PUSH (FOR BLOOD PRESSURE SUPPORT)
80.0000 ug | PREFILLED_SYRINGE | INTRAVENOUS | Status: DC | PRN
  Filled 2020-12-05: qty 10

## 2020-12-05 MED ORDER — LACTATED RINGERS IV SOLN: 500.0000 mL | Freq: Once | INTRAVENOUS | Status: DC

## 2020-12-05 MED ORDER — OXYTOCIN BOLUS FROM INFUSION
333.0000 mL | Freq: Once | INTRAVENOUS | Status: AC
  Administered 2020-12-07: 333 mL via INTRAVENOUS

## 2020-12-05 MED ORDER — PENICILLIN G POT IN DEXTROSE 60000 UNIT/ML IV SOLN
3.0000 10*6.[IU] | INTRAVENOUS | Status: DC
  Administered 2020-12-05 – 2020-12-07 (×11): 3 10*6.[IU] via INTRAVENOUS
  Filled 2020-12-05 (×11): qty 50

## 2020-12-05 MED ORDER — MISOPROSTOL 50MCG HALF TABLET
50.0000 ug | ORAL_TABLET | ORAL | Status: DC | PRN
  Administered 2020-12-05: 50 ug via BUCCAL
  Filled 2020-12-05: qty 1

## 2020-12-05 MED ORDER — TERBUTALINE SULFATE 1 MG/ML IJ SOLN: 0.2500 mg | Freq: Once | INTRAMUSCULAR | Status: DC | PRN

## 2020-12-05 MED ORDER — LACTATED RINGERS IV SOLN
INTRAVENOUS | Status: DC
  Administered 2020-12-07: 950 mL via INTRAVENOUS

## 2020-12-05 MED ORDER — OXYCODONE-ACETAMINOPHEN 5-325 MG PO TABS: 1.0000 | ORAL_TABLET | ORAL | Status: DC | PRN

## 2020-12-05 MED ORDER — PHENYLEPHRINE 40 MCG/ML (10ML) SYRINGE FOR IV PUSH (FOR BLOOD PRESSURE SUPPORT): 80.0000 ug | PREFILLED_SYRINGE | INTRAVENOUS | Status: DC | PRN

## 2020-12-05 MED ORDER — EPHEDRINE 5 MG/ML INJ: 10.0000 mg | INTRAVENOUS | Status: DC | PRN

## 2020-12-05 MED ORDER — SODIUM CHLORIDE 0.9 % IV SOLN
5.0000 10*6.[IU] | Freq: Once | INTRAVENOUS | Status: AC
  Administered 2020-12-05: 5 10*6.[IU] via INTRAVENOUS
  Filled 2020-12-05: qty 5

## 2020-12-05 MED ORDER — FENTANYL-BUPIVACAINE-NACL 0.5-0.125-0.9 MG/250ML-% EP SOLN
12.0000 mL/h | EPIDURAL | Status: DC | PRN
  Administered 2020-12-06 – 2020-12-07 (×2): 12 mL/h via EPIDURAL
  Filled 2020-12-05 (×2): qty 250

## 2020-12-05 MED ORDER — SOD CITRATE-CITRIC ACID 500-334 MG/5ML PO SOLN: 30.0000 mL | ORAL | Status: DC | PRN

## 2020-12-05 MED ORDER — LACTATED RINGERS IV SOLN: 500.0000 mL | INTRAVENOUS | Status: DC | PRN

## 2020-12-05 MED ORDER — DIPHENHYDRAMINE HCL 25 MG PO CAPS
25.0000 mg | ORAL_CAPSULE | Freq: Once | ORAL | Status: AC
  Administered 2020-12-05: 25 mg via ORAL
  Filled 2020-12-05: qty 1

## 2020-12-05 MED ORDER — FENTANYL CITRATE (PF) 100 MCG/2ML IJ SOLN
50.0000 ug | INTRAMUSCULAR | Status: DC | PRN
  Administered 2020-12-05 (×2): 100 ug via INTRAVENOUS
  Administered 2020-12-05: 50 ug via INTRAVENOUS
  Administered 2020-12-05: 100 ug via INTRAVENOUS
  Administered 2020-12-06: 50 ug via INTRAVENOUS
  Administered 2020-12-06 (×2): 100 ug via INTRAVENOUS
  Administered 2020-12-06: 50 ug via INTRAVENOUS
  Filled 2020-12-05 (×7): qty 2

## 2020-12-05 MED ORDER — LIDOCAINE HCL (PF) 1 % IJ SOLN
30.0000 mL | INTRAMUSCULAR | Status: AC | PRN
  Administered 2020-12-07: 30 mL via SUBCUTANEOUS
  Filled 2020-12-05: qty 30

## 2020-12-05 MED ORDER — MISOPROSTOL 25 MCG QUARTER TABLET
25.0000 ug | ORAL_TABLET | ORAL | Status: DC | PRN
  Administered 2020-12-05 (×2): 25 ug via VAGINAL
  Filled 2020-12-05 (×2): qty 1

## 2020-12-05 MED ORDER — LAMOTRIGINE 100 MG PO TABS
200.0000 mg | ORAL_TABLET | Freq: Every day | ORAL | Status: DC
  Administered 2020-12-06 – 2020-12-09 (×4): 200 mg via ORAL
  Filled 2020-12-05 (×5): qty 2

## 2020-12-05 MED ORDER — OXYCODONE-ACETAMINOPHEN 5-325 MG PO TABS: 2.0000 | ORAL_TABLET | ORAL | Status: DC | PRN

## 2020-12-05 MED ORDER — DIPHENHYDRAMINE HCL 50 MG/ML IJ SOLN
12.5000 mg | INTRAMUSCULAR | Status: DC | PRN
  Administered 2020-12-07: 12.5 mg via INTRAVENOUS
  Filled 2020-12-05 (×2): qty 1

## 2020-12-05 MED ORDER — OXYTOCIN-SODIUM CHLORIDE 30-0.9 UT/500ML-% IV SOLN
2.5000 [IU]/h | INTRAVENOUS | Status: DC
  Filled 2020-12-05: qty 500

## 2020-12-05 MED ORDER — LABETALOL HCL 100 MG PO TABS
100.0000 mg | ORAL_TABLET | Freq: Two times a day (BID) | ORAL | Status: DC
  Administered 2020-12-05 – 2020-12-07 (×4): 100 mg via ORAL
  Filled 2020-12-05 (×5): qty 1

## 2020-12-05 MED ORDER — ACETAMINOPHEN 325 MG PO TABS
650.0000 mg | ORAL_TABLET | ORAL | Status: DC | PRN
  Administered 2020-12-07: 650 mg via ORAL
  Filled 2020-12-05: qty 2

## 2020-12-05 MED ORDER — OXYTOCIN-SODIUM CHLORIDE 30-0.9 UT/500ML-% IV SOLN
1.0000 m[IU]/min | INTRAVENOUS | Status: DC
  Administered 2020-12-06: 2 m[IU]/min via INTRAVENOUS
  Filled 2020-12-05 (×3): qty 500

## 2020-12-05 MED ORDER — ONDANSETRON HCL 4 MG/2ML IJ SOLN
4.0000 mg | Freq: Four times a day (QID) | INTRAMUSCULAR | Status: DC | PRN
  Administered 2020-12-05: 4 mg via INTRAVENOUS
  Filled 2020-12-05: qty 2

## 2020-12-05 NOTE — H&P (Addendum)
OBSTETRIC ADMISSION HISTORY AND PHYSICAL  Madison Mcgrath is a 30 y.o. female G1P0000 with IUP at 3w0dby 7wk [place]resenting for IOL for cHTN and A2/B GDM. She reports +FMs, No LOF, no VB, no blurry vision, headaches or peripheral edema, and RUQ pain.  She plans on breast and bottle feeding. She requests POPs for birth control. 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   Thyroid disease    Vaginal Pap smear, abnormal     Past Surgical History: Past Surgical History:  Procedure Laterality Date   CHOLECYSTECTOMY     LEG SURGERY Left    stick removed from leg    Obstetrical History: OB History     Gravida  1   Para  0   Term  0   Preterm  0   AB  0   Living  0      SAB  0   IAB  0   Ectopic  0   Multiple  0   Live Births  0           Social History Social History   Socioeconomic History   Marital status: Single    Spouse name: Not on file   Number of children: Not on file   Years of education: Not on file   Highest education level: Not on file  Occupational History   Not on file  Tobacco Use   Smoking status: Never   Smokeless tobacco: Never  Vaping Use   Vaping Use: Former  Substance and Sexual Activity   Alcohol use: Not Currently   Drug use: Never   Sexual activity: Yes    Birth control/protection: None  Other Topics Concern   Not on file  Social History Narrative   Has boyfriend. Currently pregnant, Madison Mcgrath, due  12/12/2020.   Social Determinants of Health   Financial Resource Strain: Low Risk    Difficulty of Paying Living Expenses: Not hard at all  Food Insecurity: No Food Insecurity   Worried About RCharity fundraiserin the Last Year: Never true   [place] the Last [place]: Never true  Transportation Needs: No Transportation Needs   Lack of Transportation (Medical): No   Lack of Transportation (Non-Medical): No  Physical Activity: Inactive   Days of Exercise per Week: 0 days   Minutes of Exercise per Session: 0 min  Stress: No Stress Concern Present   Feeling of Stress : Not at all  Social Connections: Moderately Isolated   Frequency of Communication with Friends and Family: More than three times a week   Frequency of Social Gatherings with Friends and Family: More than three times a week   Attends Religious Services: Never   AMarine scientistor Organizations: No   Attends CMusic therapist Never   Marital Status: Living with partner    Family History: Family History  Problem Relation Age of Onset   High blood pressure Mother    Osteoporosis  Mother    Dementia Mother    Diabetes Maternal Uncle    Cancer Maternal Grandmother    Diabetes Maternal Grandfather    Colon cancer Paternal Grandmother    Kidney failure Paternal Grandmother    Cleft lip Other    Autism Other    Bipolar disorder Other     Allergies: Allergies  Allergen Reactions   Bee Venom Rash    Medications Prior to Admission  Medication Sig Dispense Refill Last Dose   aspirin 81 MG EC tablet Take 2 tablets (162 mg total) by mouth daily. Swallow whole. 180 tablet 2 12/05/2020   Blood Glucose Monitoring Suppl (ONETOUCH VERIO) w/Device KIT Use as directed to check blood sugar 4 times daily 1 kit 0 Past Week   famotidine (PEPCID) 20 MG tablet Take 20 mg by mouth 2 (two) times daily.   12/05/2020   glucose blood test strip Use as instructed 100 each 12 Past Week   lamoTRIgine (LAMICTAL)  200 MG tablet Take 1 tablet by mouth daily.   12/05/2020   Lancets (ONETOUCH DELICA PLUS [id number]) MISC Use to check blood sugar 4 times daily 100 each 12 Past Week   levothyroxine (SYNTHROID) 88 MCG tablet Take 88 mcg by mouth every morning.   12/05/2020   metFORMIN (GLUCOPHAGE) 500 MG tablet Take by mouth 2 (two) times daily with a meal. Takes 500 mg in the am and 1000 mg at night   12/05/2020   omeprazole (PRILOSEC OTC) 20 MG tablet Take 1 tablet (20 mg total) by mouth daily. 30 tablet 6 12/05/2020   Prenatal Vit-Fe Fumarate-FA (PRENATAL VITAMIN) 27-0.8 MG TABS Take 1 tablet by mouth every morning. 100 tablet 1 12/05/2020   labetalol (NORMODYNE) 100 MG tablet TAKE 1 TABLET(100 MG) BY MOUTH TWICE DAILY 60 tablet 3     Review of Systems  All systems reviewed and negative except as stated in HPI  Blood pressure 123/74, pulse 98, temperature 99.3 F (37.4 C), temperature source Oral, resp. rate 18, height 5' 5"  (1.651 m), weight (!) 153.1 kg. General appearance: alert and cooperative Lungs: no increased work of breathing Heart: regular rate and rhythm Abdomen: soft, non-tender Extremities: no sign of DVT Presentation: cephalic Fetal monitoring: Baseline: 150 bpm, Variability: Good {> 6 bpm), Accelerations: Reactive, and Decelerations: Absent Uterine activity: None Dilation: Closed Effacement (%): Thick Station: -3 Presentation: Vertex Exam by:: Dr. Maryruth Eve   Prenatal labs: ABO, Rh: --/--/O POS (11/12 1740) Antibody: NEG (11/12 0743) Rubella: 5.44 (05/12 1418) RPR: Non Reactive (09/15 1126)  HBsAg: Negative (05/12 1418)  HIV: NON REACTIVE (11/12 0800)  GBS: Positive/-- (10/27 1146)  GTT: 133/238/189 (abnormal) Genetic screening: NT/IT neg, Panorama insufficient fetal DNA Anatomy US: normal  Prenatal Transfer Tool  Maternal Diabetes: Yes:  Diabetes Type:  Insulin/Medication controlled Genetic Screening: Normal Maternal Ultrasounds/Referrals: Normal Fetal Ultrasounds or  other Referrals:  None Maternal Substance Abuse:  No Significant Maternal Medications:  Meds include: Syntroid, Lamictal Significant Maternal Lab Results: Group B Strep positive  Results for orders placed or performed during the hospital encounter of 12/05/20 (from the past 24 hour(s))  Type and screen   Collection Time: 12/05/20  7:43 AM  Result Value Ref Range   ABO/RH(D) O POS    Antibody Screen NEG    Sample Expiration      12/08/2020,2359 Performed at [facility], [place] [address] [place], [place] [address]   CBC   Collection Time: 12/05/20  8:00 AM  Result Value Ref Range  WBC 11.5 (H) 4.0 - 10.5 K/uL   RBC 5.00 3.87 - 5.11 MIL/uL   Hemoglobin 13.0 12.0 - 15.0 g/dL   HCT 40.5 36.0 - 46.0 %   MCV 81.0 80.0 - 100.0 fL   MCH 26.0 26.0 - 34.0 pg   MCHC 32.1 30.0 - 36.0 g/dL   RDW 15.3 11.5 - 15.5 %   Platelets 256 150 - 400 K/uL   nRBC 0.0 0.0 - 0.2 %  Comprehensive metabolic panel   Collection Time: 12/05/20  8:00 AM  Result Value Ref Range   Sodium 136 135 - 145 mmol/L   Potassium 3.9 3.5 - 5.1 mmol/L   Chloride 105 98 - 111 mmol/L   CO2 20 (L) 22 - 32 mmol/L   Glucose, Bld 162 (H) 70 - 99 mg/dL   BUN 6 6 - 20 mg/dL   Creatinine, Ser 0.64 0.44 - 1.00 mg/dL   Calcium 9.4 8.9 - 10.3 mg/dL   Total Protein 6.4 (L) 6.5 - 8.1 g/dL   Albumin 2.6 (L) 3.5 - 5.0 g/dL   AST 21 15 - 41 U/L   ALT 13 0 - 44 U/L   Alkaline Phosphatase 122 38 - 126 U/L   Total Bilirubin 0.4 0.3 - 1.2 mg/dL   GFR, Estimated >60 >60 mL/min   Anion gap 11 5 - 15  Rapid HIV screen (HIV 1/2 Ab+Ag)   Collection Time: 12/05/20  8:00 AM  Result Value Ref Range   HIV-1 P24 Antigen - HIV24 NON REACTIVE NON REACTIVE   HIV 1/2 Antibodies NON REACTIVE NON REACTIVE   Interpretation (HIV Ag Ab)      A non reactive test result means that HIV 1 or HIV 2 antibodies and HIV 1 p24 antigen were not detected in the specimen.  Resp Panel by RT-PCR (Flu A&B, Covid) Nasopharyngeal Swab   Collection Time:  12/05/20  8:43 AM   Specimen: Nasopharyngeal Swab; Nasopharyngeal(NP) swabs in vial transport medium  Result Value Ref Range   SARS Coronavirus 2 by RT PCR NEGATIVE NEGATIVE   Influenza A by PCR NEGATIVE NEGATIVE   Influenza B by PCR NEGATIVE NEGATIVE  Protein / creatinine ratio, urine   Collection Time: 12/05/20  9:06 AM  Result Value Ref Range   Creatinine, Urine 212.39 mg/dL   Total Protein, Urine 25 mg/dL   Protein Creatinine Ratio 0.12 0.00 - 0.15 mg/mg[Cre]  Glucose, capillary   Collection Time: 12/05/20  9:29 AM  Result Value Ref Range   Glucose-Capillary 126 (H) 70 - 99 mg/dL    Patient Active Problem List   Diagnosis Date Noted   Cough 07/29/2020   Gestational diabetes mellitus (GDM) 06/19/2020   Supervision of high-risk pregnancy 06/04/2020   Chronic hypertension affecting pregnancy 08/02/2019   Hypothyroid 05/01/2017   Morbid obesity with BMI of 50.0-59.9, adult ([place]) 11/02/2015   Skin lesion of back 11/02/2015   Attention deficit hyperactivity disorder (ADHD) 04/05/2010   Bipolar disorder ([place]) 04/05/2010    Assessment/Plan:  Yaremi MANIAH NADING is a 30 y.o. G1P0000 at 31w0dhere for IOL for cHTN and A2/B GDM  #Labor: Closed and thick. Cytotec 239m Vaginal. #Pain: PRN. Unsure about epidural, discussed alternative options  #Fetal Well Being: Category I #ID: Group B Strep positive > PCN #Method Of Feeding: breast and bottle feeding #Method Of Contraception:  POPs #Circ: N/A  #cHTN: continue Labetalol 10050mID. SBP 120's since arrival. Will obtain baseline pre-e labs.  #Hypothyroid: Continue Synthroid 24m48md #A2/B GDM: CBG 126. EFW  3590 g, 94 % at 37 +6 wks. Discussed preparing for a shoulder dystocia, PPH, and perineal lacerations. She has been taking metformin at home, plan to continue this postpartum, 1st trimester A1c 6.4%.  Continue q4h checks, consider SSI if neeeded.  #Anxiety/depression/bipolar disorder: Continue Lamictal 287m. Will need postpartum  depression follow up in the office.   DWells Guiles DO 12/05/2020, 10:37 AM PGY-1, [facility]  GME ATTESTATION:  I saw and evaluated the patient. I agree with the findings and the plan of care as documented in the resident's note.  SDarrelyn Hillock DO OB Fellow, [place]11/12/2020 1:33 PM

## 2020-12-05 NOTE — Progress Notes (Signed)
LABOR PROGRESS NOTE  Annalei FATUMA DOWERS is a 30 y.o. G1P0000 at [redacted]w[redacted]d  presented for IOL for cHTN and A2/B GDM.   Objective: BP 129/81   Pulse 91   Temp 98.3 F (36.8 C)   Resp 18   Ht 5\' 5"  (1.651 m)   Wt (!) 153.1 kg   LMP  (LMP Unknown)   BMI 56.16 kg/m  or  Vitals:   12/05/20 1329 12/05/20 1432 12/05/20 1537 12/05/20 1634  BP: (!) 142/78 138/84 131/85 129/81  Pulse: 97 94 86 91  Resp: 18 20 18 18   Temp:      TempSrc:      Weight:      Height:       Dilation: 1 Effacement (%): Thick Station: -3 Presentation: Vertex Exam by:: Dr. Fetal monitoring: Baseline: 130 bpm, Variability: Good {> 6 bpm), Accelerations: Reactive, and Decelerations: Absent Uterine activity: None  Labs: Lab Results  Component Value Date   WBC 11.5 (H) 12/05/2020   HGB 13.0 12/05/2020   HCT 40.5 12/05/2020   MCV 81.0 12/05/2020   PLT 256 12/05/2020    Patient Active Problem List   Diagnosis Date Noted   Cough 07/29/2020   Gestational diabetes mellitus (GDM) 06/19/2020   Supervision of high-risk pregnancy 06/04/2020   Chronic hypertension affecting pregnancy 08/02/2019   Hypothyroid 05/01/2017   Morbid obesity with BMI of 50.0-59.9, adult ([facility]) 11/02/2015   Skin lesion of back 11/02/2015   Attention deficit hyperactivity disorder (ADHD) 04/05/2010   Bipolar disorder ([facility]) 04/05/2010    Assessment / Plan: 30 y.o. G1P0000 at [redacted]w[redacted]d here for IOL for cHTN and A2/B GDM.  Labor: s/p cytotec x2. Difficulty with monitor causing questionable prolonged deceleration. Bolus LR and change of position improved FHT. Slow progression. FB placed. Monitor for 34 and consider another dose of cytotec.  Fetal Wellbeing:  Category I Pain Control:  PRN. Epidural when necessary. Anticipated MOD:  Vaginal #GBS positive>PCN #A2/B GDM: Last CBG 112. Continue q4h checks. #cHTN: 142/78, 138/84, but now 129/81. No severe ranges. Continue Labetalol 100mg  BID.  [redacted]w[redacted]d, DO 12/05/2020, 5:46  PM PGY-1, [facility]

## 2020-12-05 NOTE — Progress Notes (Signed)
Labor Progress Note Madison Mcgrath is a 30 y.o. G1P0000 at [redacted]w[redacted]d who presented for IOL due to [facility] and A2/B GDM.   S: Feeling more pain. Describes it as constant. Not cramping. IV Fentanyl helps but wears off. Wondering what else she can do to help with this.  O:  BP (!) 109/31 (BP Location: Left Arm)   Pulse (!) 102   Temp 97.8 F (36.6 C) (Oral)   Resp 20   Ht 5\' 5"  (1.651 m)   Wt (!) 153.1 kg   LMP  (LMP Unknown)   BMI 56.16 kg/m   EFM: Baseline 135 bpm, moderate variability, + accels, no decels  CVE: Dilation: 1 Effacement (%): Thick Station: -3 Presentation: Vertex Exam by:: Dr. [email or web address] , Annia Friendly   A&P: 30 y.o. G1P0000 [redacted]w[redacted]d  #Labor: FB remains in place. Additional dose of buccal Cytotec given at 2113. Will reassess in 4 hours or when FB dislodges.  #Pain: IV Fentanyl. Will try heating pad for additional symptomatic relief. Benadryl to help with sleep. Will continue to monitor.  #FWB: Cat 1  #GBS positive > PCN  #cHTN: Normal to mild range pressures. No symptoms. Continue Labetalol 100 mg BID.   #A2/B GDM: CBGs within acceptable range. Will continue q4hr glucose checks.  2114, MD 9:54 PM

## 2020-12-05 NOTE — Progress Notes (Signed)
LABOR PROGRESS NOTE  Madison Mcgrath is a 30 y.o. G1P0000 at [redacted]w[redacted]d  presented for IOL for cHTN and A2/B GDM.   Subjective: Comfortable in bed. No changes since admission.  Objective: BP (!) 120/56   Pulse 99   Temp 98.3 F (36.8 C)   Resp 20   Ht 5\' 5"  (1.651 m)   Wt (!) 153.1 kg   LMP  (LMP Unknown)   BMI 56.16 kg/m  or  Vitals:   12/05/20 1010 12/05/20 1117 12/05/20 1219 12/05/20 1220  BP: 123/74 129/74 (!) 120/56 (!) 120/56  Pulse: 98 98  99  Resp:  20 18 20   Temp:   98.3 F (36.8 C)   TempSrc:      Weight:      Height:       Dilation: Closed Effacement (%): Thick Station: -3 Presentation: Vertex Exam by:: Dr. Fetal monitoring: Baseline: 145 bpm, Variability: Good {> 6 bpm), Accelerations: Reactive, and Decelerations: Absent Uterine activity: None  Labs: Lab Results  Component Value Date   WBC 11.5 (H) 12/05/2020   HGB 13.0 12/05/2020   HCT 40.5 12/05/2020   MCV 81.0 12/05/2020   PLT 256 12/05/2020    Patient Active Problem List   Diagnosis Date Noted   Cough 07/29/2020   Gestational diabetes mellitus (GDM) 06/19/2020   Supervision of high-risk pregnancy 06/04/2020   Chronic hypertension affecting pregnancy 08/02/2019   Hypothyroid 05/01/2017   Morbid obesity with BMI of 50.0-59.9, adult ([facility]) 11/02/2015   Skin lesion of back 11/02/2015   Attention deficit hyperactivity disorder (ADHD) 04/05/2010   Bipolar disorder ([facility]) 04/05/2010    Assessment / Plan: 30 y.o. G1P0000 at [redacted]w[redacted]d here for IOL for cHTN and A2/B GDM.  Labor: Placed another dose of cervical cytotec. Recheck in 4 hours. Fetal Wellbeing:  Category I Pain Control:  PRN. Considering Epidural when necessary. Anticipated MOD:  Vaginal #GBS positive>PCN #A2/B GDM: Last check 112. Continue q4h checks. #cHTN: controlled at this time. Continue Labetalol 100mg  BID  34, DO 12/05/2020, 1:32 PM PGY-1, [facility]

## 2020-12-06 ENCOUNTER — Inpatient Hospital Stay (HOSPITAL_COMMUNITY): Payer: 59 | Admitting: Anesthesiology

## 2020-12-06 LAB — CBC
HCT: 38 % (ref 36.0–46.0)
Hemoglobin: 12.4 g/dL (ref 12.0–15.0)
MCH: 26.6 pg (ref 26.0–34.0)
MCHC: 32.6 g/dL (ref 30.0–36.0)
MCV: 81.4 fL (ref 80.0–100.0)
Platelets: 245 10*3/uL (ref 150–400)
RBC: 4.67 MIL/uL (ref 3.87–5.11)
RDW: 15.3 % (ref 11.5–15.5)
WBC: 11.9 10*3/uL — ABNORMAL HIGH (ref 4.0–10.5)
nRBC: 0 % (ref 0.0–0.2)

## 2020-12-06 LAB — GLUCOSE, CAPILLARY
Glucose-Capillary: 110 mg/dL — ABNORMAL HIGH (ref 70–99)
Glucose-Capillary: 112 mg/dL — ABNORMAL HIGH (ref 70–99)
Glucose-Capillary: 174 mg/dL — ABNORMAL HIGH (ref 70–99)
Glucose-Capillary: 98 mg/dL (ref 70–99)
Glucose-Capillary: 193 mg/dL — ABNORMAL HIGH (ref 70–99)
Glucose-Capillary: 102 mg/dL — ABNORMAL HIGH (ref 70–99)

## 2020-12-06 LAB — RPR: RPR Ser Ql: NONREACTIVE

## 2020-12-06 MED ORDER — LIDOCAINE HCL (PF) 1 % IJ SOLN
INTRAMUSCULAR | Status: DC | PRN
  Administered 2020-12-06 (×2): 4 mL via EPIDURAL

## 2020-12-06 MED ORDER — LACTATED RINGERS IV SOLN: 500.0000 mL | Freq: Once | INTRAVENOUS | Status: DC

## 2020-12-06 MED ORDER — INSULIN ASPART 100 UNIT/ML IJ SOLN
0.0000 [IU] | INTRAMUSCULAR | Status: DC
  Administered 2020-12-06: 1 [IU] via SUBCUTANEOUS
  Administered 2020-12-06 (×2): 3 [IU] via SUBCUTANEOUS
  Administered 2020-12-07: 1 [IU] via SUBCUTANEOUS
  Administered 2020-12-07: 2 [IU] via SUBCUTANEOUS

## 2020-12-06 NOTE — Progress Notes (Signed)
Labor Progress Note Gaia KRISTALYNN CODDINGTON is a 30 y.o. G1P0000 at [redacted]w[redacted]d who presented for IOL due to [facility] and A2/B GDM.   S: Resting comfortably. No concerns at this time.  O:  BP 125/73   Pulse 93   Temp 98.6 F (37 C) (Oral)   Resp 20   Ht 5\' 5"  (1.651 m)   Wt (!) 153.1 kg   LMP  (LMP Unknown)   BMI 56.16 kg/m   EFM: Baseline 135 bpm, moderate variability, + accels, no decels  CVE: Dilation: 5 Effacement (%): 50 Station: -3 Presentation: Vertex Exam by:: [email or web address], RN   A&P: 30 y.o. G1P0000 [redacted]w[redacted]d   #Labor: Progressing well. S/p foley balloon and Cytotec x3. Will start Pitocin 2x2. Consider AROM next check if appropriate.  #Pain: IV Fentanyl; planning for epidural when ready #FWB: Cat 1  #GBS positive > PCN  #cHTN: Normal to mild range BP. No symptoms. Continue home Labetalol 100 mg BID.   #A2/B GDM: CBGs remain in acceptable range. Continue q4hr glucose checks.   [redacted]w[redacted]d, MD 2:37 AM

## 2020-12-06 NOTE — Progress Notes (Signed)
Madison Mcgrath is a 30 y.o. G1P0000 at [redacted]w[redacted]d  admitted for induction of labor due to [facility] and A2GDM (Metformin).  Subjective: Reports pain is getting more uncomfortable. Would like epidural.  Objective: BP (!) 155/97 (BP Location: Right Arm)   Pulse 93   Temp 98.3 F (36.8 C) (Oral)   Resp 16   Ht 5\' 5"  (1.651 m)   Wt (!) 153.1 kg   LMP  (LMP Unknown)   BMI 56.16 kg/m  I/O last 3 completed shifts: In: 1575.4 [P.O.:240; I.V.:1035.4; IV Piggyback:300] Out: -  No intake/output data recorded.  FHT:  FHR: 140 bpm, variability: moderate,  accelerations:  Present,  decelerations:  Absent UC:   irregular, every 3-21minutes SVE:   Dilation: 5 Effacement (%): 50 Station: -3 Exam by:: [email or web address], RN  Labs: Lab Results  Component Value Date   WBC 11.9 (H) 12/06/2020   HGB 12.4 12/06/2020   HCT 38.0 12/06/2020   MCV 81.4 12/06/2020   PLT 245 12/06/2020    Assessment / Plan: Induction of labor due to cHTN and GDM currently on Day 2 of IOL. S/p cytotec, FB , and pitocin started overnight . Contractions spaced out every ~3-5 mins. Discussed AROM and IUPC with patient and patient amenable. Would like epidural prior to AROM. Labs are back - plan for epidural placement as soon as anesthesia team available.   Fetal Wellbeing:  Category I Pain Control:   planning epidural I/D:   GBS pos on PCN  #cHTN On Labetalol 100mg  BID. BP in 150s/90s on last two checks. Currently asymptomatic. No severe range blood pressures. Will continue to monitor and can go up on Labetalol to 200mg  BID if needed.   #A2GDM BG range 110s-120s in past 24 hours. Will order for sliding scale insulin for correction dose to use as needed.Continue q4hr checks at this time and will switch to q2hr when more active   12/08/2020 12/06/2020, 9:56 AM

## 2020-12-06 NOTE — Progress Notes (Signed)
 of fentanyl pulled out of room pyxis for epidural placement per Dr Stephannie Peters. After first dose given, RN instructed to give the second per anesthesiologist. Waste needed in pyxis, though nothing to waste because of second dose given.

## 2020-12-06 NOTE — Progress Notes (Signed)
Labor Progress Note Madison Mcgrath is a 31 y.o. G1P0000 at [redacted]w[redacted]d who presented for IOL due to [facility] and A2/B GDM.   S: Doing well. Currently in flying cowgirl position. No concerns at this time.   O:  BP 128/68   Pulse 78   Temp 98.1 F (36.7 C) (Oral)   Resp 16   Ht 5\' 5"  (1.651 m)   Wt (!) 153.1 kg   LMP  (LMP Unknown)   BMI 56.16 kg/m   EFM: Baseline 140 bpm, moderate variability, + accels, no decels  CVE: Dilation: 5 Effacement (%): 80 Station: -3 Presentation: Vertex Exam by:: [email or web address]   A&P: 30 y.o. G1P0000 [redacted]w[redacted]d   #Labor: SVE remains unchanged. Now re-positioned into flying cowgirl position. Pitocin at 18 milli-units/min after Pitocin break from 1400-1600 earlier today. Inadequate contraction pattern per IUPC. Will continue to go up on Pitocin and try to reach adequacy with her contraction pattern.  #Pain: Epidural  #FWB: Cat 1  #GBS positive > PCN  #cHTN: BP is in normal to mild range. No symptoms. On Labetalol 100 mg BID. Will continue to monitor.   #A2/B GDM: Previously had glucose of 193 at 18:52. Received SSI. Repeat check at 21:59 102. Will continue to monitor with q4hr glucose checks and SSI as needed.  [redacted]w[redacted]d, MD 10:32 PM

## 2020-12-06 NOTE — Anesthesia Preprocedure Evaluation (Signed)
Anesthesia Evaluation  Patient identified by MRN, date of birth, ID band Patient awake    Reviewed: Allergy & Precautions, Patient's Chart, lab work & pertinent test results, reviewed documented beta blocker date and time   History of Anesthesia Complications Negative for: history of anesthetic complications  Airway Mallampati: II  TM Distance: >3 FB Neck ROM: Full    Dental no notable dental hx.    Pulmonary neg pulmonary ROS,    Pulmonary exam normal        Cardiovascular hypertension, Pt. on medications and Pt. on home beta blockers Normal cardiovascular exam     Neuro/Psych Anxiety Depression Bipolar Disorder negative neurological ROS     GI/Hepatic Neg liver ROS, GERD  ,  Endo/Other  diabetes, GestationalHypothyroidism Morbid obesity  Renal/GU negative Renal ROS  negative genitourinary   Musculoskeletal negative musculoskeletal ROS (+)   Abdominal   Peds  Hematology negative hematology ROS (+)   Anesthesia Other Findings Day of surgery medications reviewed with patient.  Reproductive/Obstetrics (+) Pregnancy                            Anesthesia Physical Anesthesia Plan  ASA: 3  Anesthesia Plan: Epidural   Post-op Pain Management:    Induction:   PONV Risk Score and Plan: Treatment may vary due to age or medical condition  Airway Management Planned: Natural Airway  Additional Equipment:   Intra-op Plan:   Post-operative Plan:   Informed Consent: I have reviewed the patients History and Physical, chart, labs and discussed the procedure including the risks, benefits and alternatives for the proposed anesthesia with the patient or authorized representative who has indicated his/her understanding and acceptance.       Plan Discussed with:   Anesthesia Plan Comments:         Anesthesia Quick Evaluation

## 2020-12-06 NOTE — Progress Notes (Signed)
Madison Mcgrath is a 30 y.o. G1P0000 at [redacted]w[redacted]d  admitted for induction of labor due to [place] and A2GDM.   Objective: BP 121/74   Pulse 96   Temp 97.9 F (36.6 C) (Oral)   Resp 16   Ht 5\' 5"  (1.651 m)   Wt (!) 153.1 kg   LMP  (LMP Unknown)   BMI 56.16 kg/m  I/O last 3 completed shifts: In: 1575.4 [P.O.:240; I.V.:1035.4; IV Piggyback:300] Out: 1300 [Urine:1300] No intake/output data recorded.  FHT:  FHR: 140 bpm, variability: moderate,  accelerations:  Present,  decelerations:  Absent UC:   regular, every 1-3 minutes SVE:   Dilation: 5 Effacement (%): 80 Station: -3 Exam by:: [email or web address], RN  Labs: Lab Results  Component Value Date   WBC 11.9 (H) 12/06/2020   HGB 12.4 12/06/2020   HCT 38.0 12/06/2020   MCV 81.4 12/06/2020   PLT 245 12/06/2020    Assessment / Plan: Labor: S/p pitocin break, restarted pitocin at ~1600.  Fetal Wellbeing:  Category I Pain Control:  Epidural I/D:   GBS pos>PCN  #cHTN Labetalol 100mg  BID  #GDM Checking q4hr. BG 193 and received 3U insulin per SSI order. Continue checking q4hr and then switch to q2hr once more active  12/08/2020 12/06/2020, 7:12 PM

## 2020-12-06 NOTE — Anesthesia Procedure Notes (Signed)
Epidural Patient location during procedure: OB Start time: 12/06/2020 10:15 AM End time: 12/06/2020 10:31 AM  Staffing Anesthesiologist: Kaylyn Layer, MD Performed: anesthesiologist   Preanesthetic Checklist Completed: patient identified, IV checked, risks and benefits discussed, monitors and equipment checked, pre-op evaluation and timeout performed  Epidural Patient position: sitting Prep: DuraPrep and site prepped and draped Patient monitoring: continuous pulse ox, blood pressure and heart rate Approach: midline Location: L3-L4 Injection technique: LOR air  Needle:  Needle type: Tuohy  Needle gauge: 17 G Needle length: 9 cm Needle insertion depth: 9 cm Catheter type: closed end flexible Catheter size: 19 Gauge Catheter at skin depth: 15 cm Test dose: negative and Other (1% lidocaine)  Assessment Events: blood not aspirated, injection not painful, no injection resistance, no paresthesia and negative IV test  Additional Notes Patient identified. Risks, benefits, and alternatives discussed with patient including but not limited to bleeding, infection, nerve damage, paralysis, failed block, incomplete pain control, headache, blood pressure changes, nausea, vomiting, reactions to medication, itching, and postpartum back pain. Confirmed with bedside nurse the patient's most recent platelet count. Confirmed with patient that they are not currently taking any anticoagulation, have any bleeding history, or any family history of bleeding disorders. Patient expressed understanding and wished to proceed. All questions were answered. Sterile technique was used throughout the entire procedure. Please see nursing notes for vital signs.   Very difficult placement due to habitus and patient positioning. 4 attempts required. Ultrasound used after first 3 attempts to identify midline and epidural depth (noted at approximately 10cm). 9cm Tuohy needle firmly pressed into subcutaneous tissue to  obtain LOR. Test dose was given through epidural catheter and negative prior to continuing to dose epidural or start infusion. Warning signs of high block given to the patient including shortness of breath, tingling/numbness in hands, complete motor block, or any concerning symptoms with instructions to call for help. Patient was given instructions on fall risk and not to get out of bed. All questions and concerns addressed with instructions to call with any issues or inadequate analgesia.  Reason for block:procedure for pain

## 2020-12-06 NOTE — Progress Notes (Signed)
ADA Standards of Care 2022 Diabetes in Pregnancy Target Glucose Ranges:  Fasting: 60 - 90 mg/dL Preprandial: 60 - 314 mg/dL 1 hr postprandial: Less than 140mg /dL (from first bite of meal) 2 hr postprandial: Less than 120 mg/dL (from first bit of meal) Results for ELDANA, ISIP (MRN Antoine Poche) as of 12/06/2020 10:19  Ref. Range 12/05/2020 09:29 12/05/2020 13:34 12/05/2020 18:11 12/05/2020 21:58 12/06/2020 01:58 12/06/2020 06:03  Glucose-Capillary Latest Ref Range: 70 - 99 mg/dL 12/08/2020 (H) 885 (H) 027 (H) 125 (H) 110 (H) 112 (H)    Admit for IOL at 39 weeks  History: GDM  Home DM Meds: Metformin 500 mg AM/ 1000 mg PM  Current Orders: Novolog 0-14 units Q4 hours    Orders placed for Novolog SSI coverage this AM  Will start at 12pm today  Note plan to have pt continue Metformin after delivery and discharge home due ot A1c being 6.4% in the 1st trimester  Will follow     --Will follow patient during hospitalization--  [phone] RN, MSN, CDE Diabetes Coordinator Inpatient Glycemic Control Team Team Pager: [phone] (8a-5p)

## 2020-12-07 ENCOUNTER — Other Ambulatory Visit: Payer: 59

## 2020-12-07 DIAGNOSIS — O99284 Endocrine, nutritional and metabolic diseases complicating childbirth: Secondary | ICD-10-CM

## 2020-12-07 DIAGNOSIS — O99820 Streptococcus B carrier state complicating pregnancy: Secondary | ICD-10-CM

## 2020-12-07 DIAGNOSIS — O24419 Gestational diabetes mellitus in pregnancy, unspecified control: Secondary | ICD-10-CM

## 2020-12-07 DIAGNOSIS — Z3A39 39 weeks gestation of pregnancy: Secondary | ICD-10-CM

## 2020-12-07 DIAGNOSIS — O24424 Gestational diabetes mellitus in childbirth, insulin controlled: Secondary | ICD-10-CM

## 2020-12-07 DIAGNOSIS — O1002 Pre-existing essential hypertension complicating childbirth: Secondary | ICD-10-CM

## 2020-12-07 LAB — CBC
HCT: 39.7 % (ref 36.0–46.0)
Hemoglobin: 12.7 g/dL (ref 12.0–15.0)
MCH: 25.9 pg — ABNORMAL LOW (ref 26.0–34.0)
MCHC: 32 g/dL (ref 30.0–36.0)
MCV: 80.9 fL (ref 80.0–100.0)
Platelets: 249 10*3/uL (ref 150–400)
RBC: 4.91 MIL/uL (ref 3.87–5.11)
RDW: 15.7 % — ABNORMAL HIGH (ref 11.5–15.5)
WBC: 19.5 10*3/uL — ABNORMAL HIGH (ref 4.0–10.5)
nRBC: 0 % (ref 0.0–0.2)

## 2020-12-07 LAB — GLUCOSE, CAPILLARY
Glucose-Capillary: 99 mg/dL (ref 70–99)
Glucose-Capillary: 126 mg/dL — ABNORMAL HIGH (ref 70–99)

## 2020-12-07 MED ORDER — BENZOCAINE-MENTHOL 20-0.5 % EX AERO: 1.0000 "application " | INHALATION_SPRAY | CUTANEOUS | Status: DC | PRN

## 2020-12-07 MED ORDER — PRENATAL MULTIVITAMIN CH
1.0000 | ORAL_TABLET | Freq: Every day | ORAL | Status: DC
  Administered 2020-12-08 – 2020-12-09 (×2): 1 via ORAL
  Filled 2020-12-07 (×2): qty 1

## 2020-12-07 MED ORDER — METFORMIN HCL 500 MG PO TABS
500.0000 mg | ORAL_TABLET | Freq: Two times a day (BID) | ORAL | Status: DC
  Administered 2020-12-07: 500 mg via ORAL
  Filled 2020-12-07: qty 1

## 2020-12-07 MED ORDER — DIPHENHYDRAMINE HCL 25 MG PO CAPS: 25.0000 mg | ORAL_CAPSULE | Freq: Four times a day (QID) | ORAL | Status: DC | PRN

## 2020-12-07 MED ORDER — ONDANSETRON HCL 4 MG PO TABS: 4.0000 mg | ORAL_TABLET | ORAL | Status: DC | PRN

## 2020-12-07 MED ORDER — MEDROXYPROGESTERONE ACETATE 150 MG/ML IM SUSP: 150.0000 mg | INTRAMUSCULAR | Status: DC | PRN

## 2020-12-07 MED ORDER — IBUPROFEN 600 MG PO TABS
600.0000 mg | ORAL_TABLET | Freq: Four times a day (QID) | ORAL | Status: DC
  Administered 2020-12-07 – 2020-12-09 (×8): 600 mg via ORAL
  Filled 2020-12-07 (×8): qty 1

## 2020-12-07 MED ORDER — NIFEDIPINE ER OSMOTIC RELEASE 30 MG PO TB24
30.0000 mg | ORAL_TABLET | Freq: Every day | ORAL | Status: DC
  Administered 2020-12-08: 30 mg via ORAL
  Filled 2020-12-07: qty 1

## 2020-12-07 MED ORDER — ONDANSETRON HCL 4 MG/2ML IJ SOLN: 4.0000 mg | INTRAMUSCULAR | Status: DC | PRN

## 2020-12-07 MED ORDER — LABETALOL HCL 5 MG/ML IV SOLN: 40.0000 mg | INTRAVENOUS | Status: DC | PRN

## 2020-12-07 MED ORDER — SENNOSIDES-DOCUSATE SODIUM 8.6-50 MG PO TABS
2.0000 | ORAL_TABLET | Freq: Every day | ORAL | Status: DC
  Administered 2020-12-08 – 2020-12-09 (×2): 2 via ORAL
  Filled 2020-12-07 (×2): qty 2

## 2020-12-07 MED ORDER — ACETAMINOPHEN 325 MG PO TABS
650.0000 mg | ORAL_TABLET | ORAL | Status: DC | PRN
  Administered 2020-12-08: 650 mg via ORAL
  Filled 2020-12-07: qty 2

## 2020-12-07 MED ORDER — COCONUT OIL OIL: 1.0000 "application " | TOPICAL_OIL | Status: DC | PRN

## 2020-12-07 MED ORDER — DIBUCAINE (PERIANAL) 1 % EX OINT: 1.0000 "application " | TOPICAL_OINTMENT | CUTANEOUS | Status: DC | PRN

## 2020-12-07 MED ORDER — FERROUS SULFATE 325 (65 FE) MG PO TABS
325.0000 mg | ORAL_TABLET | ORAL | Status: DC
  Administered 2020-12-09: 325 mg via ORAL
  Filled 2020-12-07: qty 1

## 2020-12-07 MED ORDER — SIMETHICONE 80 MG PO CHEW: 80.0000 mg | CHEWABLE_TABLET | ORAL | Status: DC | PRN

## 2020-12-07 MED ORDER — CYCLOBENZAPRINE HCL 5 MG PO TABS
5.0000 mg | ORAL_TABLET | Freq: Once | ORAL | Status: AC
  Administered 2020-12-07: 5 mg via ORAL
  Filled 2020-12-07: qty 1

## 2020-12-07 MED ORDER — WITCH HAZEL-GLYCERIN EX PADS: 1.0000 "application " | MEDICATED_PAD | CUTANEOUS | Status: DC | PRN

## 2020-12-07 MED ORDER — LABETALOL HCL 5 MG/ML IV SOLN
20.0000 mg | INTRAVENOUS | Status: DC | PRN
  Administered 2020-12-07: 20 mg via INTRAVENOUS
  Filled 2020-12-07: qty 4

## 2020-12-07 MED ORDER — LABETALOL HCL 5 MG/ML IV SOLN: 80.0000 mg | INTRAVENOUS | Status: DC | PRN

## 2020-12-07 MED ORDER — TRANEXAMIC ACID-NACL 1000-0.7 MG/100ML-% IV SOLN
1000.0000 mg | Freq: Once | INTRAVENOUS | Status: AC
  Administered 2020-12-07: 1000 mg via INTRAVENOUS

## 2020-12-07 MED ORDER — MEASLES, MUMPS & RUBELLA VAC IJ SOLR: 0.5000 mL | Freq: Once | INTRAMUSCULAR | Status: DC

## 2020-12-07 MED ORDER — TRANEXAMIC ACID-NACL 1000-0.7 MG/100ML-% IV SOLN
INTRAVENOUS | Status: AC
  Filled 2020-12-07: qty 100

## 2020-12-07 MED ORDER — TETANUS-DIPHTH-ACELL PERTUSSIS 5-2.5-18.5 LF-MCG/0.5 IM SUSY: 0.5000 mL | PREFILLED_SYRINGE | Freq: Once | INTRAMUSCULAR | Status: DC

## 2020-12-07 NOTE — Discharge Summary (Addendum)
Postpartum Discharge Summary  Date of Service updated 12/09/20     Patient Name: Madison Mcgrath DOB: 1990-10-25 MRN: [id number]  Date of admission: 12/05/2020 Delivery date:12/07/2020  Delivering provider: Araceli Bouche  Date of discharge: 12/09/2020  Admitting diagnosis: Chronic hypertension affecting pregnancy [O10.919] Intrauterine pregnancy: [redacted]w[redacted]d    Secondary diagnosis:  Active Problems:   Attention deficit hyperactivity disorder (ADHD)   Bipolar disorder ([place]   Hypothyroid   Chronic hypertension affecting pregnancy   Gestational diabetes mellitus (GDM)  Additional problems: None    Discharge diagnosis: Term Pregnancy Delivered, CHTN, and GDM A2                                              Post partum procedures: none Augmentation: AROM, Pitocin, Cytotec, and IP Foley Complications: None  Hospital course: Induction of Labor With Vaginal Delivery   30y.o. yo G1P0000 at 317w2das admitted to the hospital 12/05/2020 for induction of labor.  Indication for induction: A2 DM.  Patient had an uncomplicated labor course as follows: Membrane Rupture Time/Date: 11:17 AM ,12/06/2020   Delivery Method:Vaginal, Spontaneous  Episiotomy: None  Lacerations:  Labial  Details of delivery can be found in separate delivery note.  Patient had a routine postpartum course. Patient is discharged home 12/09/20.  Newborn Data: Birth date:12/07/2020  Birth time:11:00 AM  Gender:Female  Living status:Living  Apgars:4 ,9  Weight:8 lb 5.3 oz (3.78 kg)   Magnesium Sulfate received: No BMZ received: No Rhophylac:No MMR:No T-DaP:Given prenatally Flu: No Transfusion:No  Physical exam  Vitals:   12/08/20 1615 12/08/20 1713 12/08/20 2140 12/09/20 0505  BP: 103/90 119/65 135/75 113/72  Pulse: 96   88  Resp:   18 18  Temp:   98.5 F (36.9 C) 97.9 F (36.6 C)  TempSrc:   Oral Oral  SpO2:    99%  Weight:      Height:       General: alert, cooperative, and no distress Lochia:  appropriate Uterine Fundus: firm Incision: N/A DVT Evaluation: No evidence of DVT seen on physical exam. Negative Homan's sign. No cords or calf tenderness. No significant calf/ankle edema. Labs: Lab Results  Component Value Date   WBC 19.5 (H) 12/07/2020   HGB 12.7 12/07/2020   HCT 39.7 12/07/2020   MCV 80.9 12/07/2020   PLT 249 12/07/2020   CMP Latest Ref Rng & Units 12/05/2020  Glucose 70 - 99 mg/dL 162(H)  BUN 6 - 20 mg/dL 6  Creatinine 0.44 - 1.00 mg/dL 0.64  Sodium 135 - 145 mmol/L 136  Potassium 3.5 - 5.1 mmol/L 3.9  Chloride 98 - 111 mmol/L 105  CO2 22 - 32 mmol/L 20(L)  Calcium 8.9 - 10.3 mg/dL 9.4  Total Protein 6.5 - 8.1 g/dL 6.4(L)  Total Bilirubin 0.3 - 1.2 mg/dL 0.4  Alkaline Phos 38 - 126 U/L 122  AST 15 - 41 U/L 21  ALT 0 - 44 U/L 13   Edinburgh Score: Edinburgh Postnatal Depression Scale Screening Tool 12/08/2020  I have been able to laugh and see the funny side of things. 0  I have looked forward with enjoyment to things. 0  I have blamed myself unnecessarily when things went wrong. 0  I have been anxious or worried for no good reason. 0  I have felt scared or panicky for no good reason. 0  Things have been getting on top of me. 0  I have been so unhappy that I have had difficulty sleeping. 0  I have felt sad or miserable. 0  I have been so unhappy that I have been crying. 0  The thought of harming myself has occurred to me. 0  Edinburgh Postnatal Depression Scale Total 0     After visit meds:  Allergies as of 12/09/2020       Reactions   Bee Venom Rash        Medication List     STOP taking these medications    aspirin 81 MG EC tablet   famotidine 20 MG tablet Commonly known as: PEPCID   glucose blood test strip   labetalol 100 MG tablet Commonly known as: NORMODYNE   metFORMIN 500 MG tablet Commonly known as: GLUCOPHAGE   OneTouch Delica Plus [id number] Misc   OneTouch Verio w/Device Kit       TAKE these medications     acetaminophen 325 MG tablet Commonly known as: Tylenol Take 2 tablets (650 mg total) by mouth every 4 (four) hours as needed (for pain scale < 4).   ibuprofen 600 MG tablet Commonly known as: ADVIL Take 1 tablet (600 mg total) by mouth every 6 (six) hours.   lamoTRIgine 200 MG tablet Commonly known as: LAMICTAL Take 1 tablet by mouth daily.   levothyroxine 25 MCG tablet Commonly known as: SYNTHROID Take 1 tablet (25 mcg total) by mouth every morning. What changed:  medication strength how much to take   losartan 50 MG tablet Commonly known as: COZAAR Take 1 tablet (50 mg total) by mouth daily.   omeprazole 20 MG tablet Commonly known as: PriLOSEC OTC Take 1 tablet (20 mg total) by mouth daily.   Prenatal Vitamin 27-0.8 MG Tabs Take 1 tablet by mouth every morning.   Senexon-S 8.6-50 MG tablet Generic drug: senna-docusate Take 2 tablets by mouth at bedtime as needed for mild constipation.   V-R GAS RELIEF 80 MG chewable tablet Generic drug: simethicone Chew 1 tablet (80 mg total) by mouth as needed for flatulence.   witch hazel-glycerin pad Commonly known as: TUCKS Apply 1 application topically as needed for hemorrhoids.         Discharge home in stable condition Infant Feeding: Bottle Infant Disposition:home with mother Discharge instruction: per After Visit Summary and Postpartum booklet. Activity: Advance as tolerated. Pelvic rest for 6 weeks.  Diet: routine diet Future Appointments: Future Appointments  Date Time Provider [place]  12/14/2020 11:10 AM Florian Buff, MD [facility]  12/21/2020 10:50 AM Estill Dooms, NP [facility]  01/11/2021 11:10 AM Roma Schanz, CNM [facility]  01/29/2021 10:00 AM Renee Rival, FNP [facility]  10/18/2021 11:00 AM [facility] NURSE ADVISOR [facility]   Follow up Visit:  Follow-up Information     Florian Buff, MD Follow up on 12/14/2020.   Specialties: Obstetrics and  Gynecology, Radiology Why: at 11:10 AM Contact information: [place] [address] [phone]         Estill Dooms, NP Follow up on 12/21/2020.   Specialty: Obstetrics and Gynecology Why: at 10:50 AM Contact information: [place] [address] [phone]         Roma Schanz, CNM Follow up on 01/11/2021.   Specialty: Obstetrics and Gynecology Why: at 11:10 AM Contact information: [place] [address] [phone]  Message sent to [facility] by Dr Higinio Plan on 11/14:   Please schedule this patient for a In person postpartum visit in 6 weeks with the following provider: MD and Any provider. Additional Postpartum F/U:Postpartum Depression checkup, Incision check 1 week, and BP check 1 week  High risk pregnancy complicated by: GDM and HTN Delivery mode:  Vaginal, Spontaneous  Anticipated Birth Control:  POPs vs COCs  Attestation of Supervision of Student:  I confirm that I have verified the information documented in the  resident's  note and that I have also personally reperformed the history, physical exam and all medical decision making activities.  I have verified that all services and findings are accurately documented in this student's note; and I agree with management and plan as outlined in the documentation. I have also made any necessary editorial changes.   Kerry Hough, PA-C [facility] [employer] 12/09/2020 10:59 AM

## 2020-12-07 NOTE — Lactation Note (Signed)
This note was copied from a baby's chart. Lactation Consultation Note  Patient Name: Madison Mcgrath [id number] Date: 12/07/2020   Age:32 hours  Mother states she would like to formula feed.   Dahlia Byes Boschen 12/07/2020, 11:58 AM

## 2020-12-07 NOTE — Progress Notes (Signed)
Labor Progress Note Madison Mcgrath is a 30 y.o. G1P0000 at [redacted]w[redacted]d who presented for IOL due to [facility] and A2/B GDM.   S: Doing well. Feeling a little more discomfort down in her vaginal area. No other concerns at this time.  O:  BP 123/69   Pulse 78   Temp 98.1 F (36.7 C) (Oral)   Resp 18   Ht 5\' 5"  (1.651 m)   Wt (!) 153.1 kg   LMP  (LMP Unknown)   BMI 56.16 kg/m   EFM: Baseline 130 bpm, moderate variability, + accels, no decels   CVE: Dilation: 5.5 Effacement (%): 90 Station: -1 Presentation: Vertex Exam by:: Dr. [email or web address]   A&P: 30 y.o. G1P0000 [redacted]w[redacted]d   #Labor: Slightly more effaced and fetal head descent with this check. IUPC replaced given that contractions were not tracing well. Will reassess and continue to titrate up on Pitocin.  #Pain: Epidural  #FWB: Cat 1  #GBS positive > PCN  #cHTN: BP remains normal to mild range. No symptoms. Continue to monitor.   #A2/B GDM: Continue q4hr glucose checks.   [redacted]w[redacted]d, MD 1:24 AM

## 2020-12-08 LAB — GLUCOSE, CAPILLARY: Glucose-Capillary: 111 mg/dL — ABNORMAL HIGH (ref 70–99)

## 2020-12-08 MED ORDER — INFLUENZA VAC SPLIT QUAD 0.5 ML IM SUSY
0.5000 mL | PREFILLED_SYRINGE | INTRAMUSCULAR | Status: AC
  Administered 2020-12-09: 0.5 mL via INTRAMUSCULAR
  Filled 2020-12-08: qty 0.5

## 2020-12-08 MED ORDER — LOSARTAN POTASSIUM 50 MG PO TABS
50.0000 mg | ORAL_TABLET | Freq: Every day | ORAL | Status: DC
  Administered 2020-12-08 – 2020-12-09 (×2): 50 mg via ORAL
  Filled 2020-12-08 (×2): qty 1

## 2020-12-08 NOTE — Lactation Note (Signed)
This note was copied from a baby's chart. Lactation Consultation Note  Patient Name: Madison Mcgrath ZWCHE'N Date: 12/08/2020 Reason for consult: Initial assessment;Mother's request;RN request;1st time breastfeeding;Primapara;Term;Other (Comment) (mom changed her mind from formula to Breast / formula and plans to exclusively pump to provide breast milk for her baby.) Age:32 hours LC reviewed the set up of the DEBP and check the flanges ( #24 F on the right and #27 F on the left ).  LC discussed Supply and demand / importance of being consistent with pumping both breast for 15 mins  after baby is fed  around the clock.  LC reassured mom  pumping potentially can be a slow process/ consistency is the key/ and 8 - 10 times a day.  Mom receptive to teaching.   Maternal Data Does the patient have breastfeeding experience prior to this delivery?: No  Feeding Mother's Current Feeding Choice: Breast Milk and Formula Nipple Type: Slow - flow  LATCH Score                    Lactation Tools Discussed/Used Tools: Pump;Flanges Flange Size: 21;24 (LC assessed flange size - #21 on the left and #24 F on the right / per mom comfortable) Breast pump type: Double-Electric Breast Pump Pump Education: Setup, frequency, and cleaning Reason for Pumping: mom requested to be set up and plans to pump and bottle feed at home. Pumped volume:  (still pumping with colostrum yield)  Interventions Interventions: Breast feeding basics reviewed;DEBP;Education  Discharge Pump: Personal;DEBP (per mom the DEBP from [employer] through her insurance) WIC Program: Yes  Consult Status Consult Status: Follow-up Date: 12/09/20 Follow-up type: In-patient    Madison Mcgrath 12/08/2020, 6:29 PM

## 2020-12-08 NOTE — Progress Notes (Signed)
Pt last two blood pressures 114/89, 103/90. Pt has no signs or symptoms of Preeclampsia. Dr. Victory Dakin notified. Order given to recheck blood pressure in one hour. Will continue to monitor pt.

## 2020-12-08 NOTE — Progress Notes (Addendum)
Post Partum Day 1 Subjective: no complaints, up ad lib, voiding, tolerating PO, and + flatus  Objective: Blood pressure 114/89, pulse 93, temperature 97.9 F (36.6 C), temperature source Oral, resp. rate 18, height 5\' 5"  (1.651 m), weight (!) 153.1 kg, SpO2 100 %.  Physical Exam:  General: alert, cooperative, no distress, and morbidly obese Lochia: appropriate Uterine Fundus: firm Incision: n/a DVT Evaluation: No evidence of DVT seen on physical exam. Negative Homan's sign. No cords or calf tenderness. No significant calf/ankle edema.  Recent Labs    12/06/20 0848 12/07/20 1313  HGB 12.4 12.7  HCT 38.0 39.7    Assessment/Plan: Plan for discharge tomorrow #MOF: breast and bottle #MOC: Planning to use POPs.  #cHTN: Bps running from 110s-130s systolic and 70s-80s diastolic postpartum. Changed meds to losartan (pre pregnancy medication) .  #GDMA2/B: CBG this AM was 111. Her 1st trimester was A1c 6.4%. Holding metforming postpartum.  #Hypothyroid: Levothyroixine 88 mcg during pregnancy. Pre-pregnancy dose was 25 mcg. Will continue 88 mcg until her postpartum check up.   LOS: 3 days   12/09/20 12/08/2020, 7:06 AM

## 2020-12-08 NOTE — Anesthesia Postprocedure Evaluation (Signed)
Anesthesia Post Note  Patient: Madison Mcgrath  Procedure(s) Performed: AN AD HOC LABOR EPIDURAL     Patient location during evaluation: Mother Baby Anesthesia Type: Epidural Level of consciousness: awake, oriented and awake and alert Pain management: pain level controlled Vital Signs Assessment: post-procedure vital signs reviewed and stable Respiratory status: spontaneous breathing, respiratory function stable and nonlabored ventilation Cardiovascular status: stable Postop Assessment: no headache, adequate PO intake, able to ambulate, patient able to bend at knees, no backache and no apparent nausea or vomiting Anesthetic complications: no   No notable events documented.  Last Vitals:  Vitals:   12/08/20 0025 12/08/20 0604  BP: 133/87 114/89  Pulse: 97 93  Resp: 18 18  Temp: 36.7 C 36.6 C  SpO2: 99% 100%    Last Pain:  Vitals:   12/08/20 0705  TempSrc:   PainSc: 0-No pain   Pain Goal: Patients Stated Pain Goal: 1 (12/05/20 1950)                 Trevor Duty

## 2020-12-08 NOTE — Clinical Social Work Maternal (Addendum)
CLINICAL SOCIAL WORK MATERNAL/CHILD NOTE  Patient Details  Name: Madison Mcgrath MRN: [id number] Date of Birth: 03/18/1990  Date:  09/13/20  Clinical Social Worker Initiating Note:  [name], [place] Date/Time: Initiated:  12/08/20/1257     Child's Name:  Madison Mcgrath   Biological Parents:  Mother, Father (MOB: Madison Mcgrath 02[birth year]1988, FOB: Madison Mcgrath 08-26-1990)   Need for Interpreter:  None   Reason for Referral:  Behavioral Health Concerns   Address:  [address] [place] [address]    Phone number:  [phone] (home)     Additional phone number:   Household Members/Support Persons (HM/SP):   Household Member/Support Person 1, Household Member/Support Person 2   HM/SP Name Relationship DOB or Age  HM/SP -[place] Other 08-26-1988  HM/SP -[place] Mother    HM/SP -3        HM/SP -4        HM/SP -5        HM/SP -6        HM/SP -7        HM/SP -8          Natural Supports (not living in the home):      Professional Supports: Other (Comment) (Psychiatrist "Public librarian at [employer]")   Employment: Disabled   Type of Work:     Education:  [place] arranged:    Museum/gallery curator Resources:  SSI/Disability    Other Resources:  Physicist, medical  , [place] Considerations Which May Impact Care:    Strengths:  Ability to meet basic needs  , Home prepared for child  , Engineer, materials, Psychotropic Medications   Psychotropic Medications:  Lamictal      Pediatrician:    [place] (including Technical sales engineer and surronding areas)  Pediatrician List:   [place]      Pediatrician Fax Number:    Risk Factors/Current Problems:  Mental Health Concerns     Cognitive State:  Able to Concentrate  , Alert  , Insightful  , Linear Thinking     Mood/Affect:  Calm  , Bright  , Comfortable      CSW Assessment:  CSW received consult for hx of Bipolar, ADHD, Anxiety and Depression CSW met with MOB to offer support and complete assessment.     CSW met with MOB at bedside and introduced CSW role. CSW observed MOB lying in the bed, the infant asleep in the bassinet and FOB up in the room. MOB presented calm and welcomed CSW to complete the assessment. FOB left the room to allow MOB privacy. MOB confirmed the demographic information on hospital file is correct. MOB reported she, FOB and her mother live together. MOB identified FOB, her mom, brother, and FOB's family as her supports. CSW inquired how MOB has felt since giving birth. MOB expressed having some soreness but overall feels fine. MOB reported she has been walking the halls to help with the soreness. CSW inquired how MOB felt during the pregnancy. MOB reported during the first, second and third trimester she expressed feeling irritable due to history of Bipolar (manic and depressive episodes). MOB disclosed she has mental history for Bipolar and ADHD and receives disability for the mental health diagnosis. MOB reported at the beginning of the pregnancy she stopped taking Vraylar and Lamictal  for Bipolar symptoms due to the pregnancy. MOB reported she saw Prenatal Psychiatrist "Dr. Audelia Acton" that suggested she stay on the Lamictal because it was safe to use during the pregnancy. MOB reported the Lamictal is stabilizing her mood however she is looking forward to restarting her Arman Filter because she feels it is very helpful. MOB reported she has one more visit with Dr. Audelia Acton postpartum then Dr. Audelia Acton will refer out to another psychiatrist in [place] that will continue managing her care. MOB reported she saw a therapist as child and is not interested in seeing a therapist at this time. MOB reported having a terrible experience with therapy while living in a group home. CSW inquired MOB coping skills. MOB reported doing art and listening to music  usually helps her cope. MOB shared she is looking forward focusing in on being a new mom and caring for the baby.  CSW praised MOB for her efforts. MOB was receptive to the education regarding the baby blues period vs. perinatal mood disorders, discussed treatment and gave resources for mental health follow up if concerns arise.  CSW recommended MOB complete a self-evaluation during the postpartum time period using the New Mom Checklist from Postpartum Progress and encouraged MOB to contact a medical professional if symptoms are noted at any time.  MOB reported she feels comfortable reaching out to her doctor is she has concerns. CSW assessed MOB for safety. MOB denied thoughts of harm to self and others.   CSW provided review of Sudden Infant Death Syndrome (SIDS) precautions. MOB reported she has essential items for the infant including a bassinet and a car seat. MOB has chosen [employer] [facility] for infant's follow up appointment. MOB reported receives both WIC/FS benefits. CSW assessed MOB for additional needs. MOB reported no additional needs.   CSW identifies no further need for intervention and no barriers to discharge at this time.     CSW Plan/Description:  Sudden Infant Death Syndrome (SIDS) Education, Perinatal Mood and Anxiety Disorder (PMADs) Education, No Further Intervention Required/No Barriers to Discharge    Lia Hopping, [place] Oct 06, 2020, 1:51 PM

## 2020-12-09 ENCOUNTER — Other Ambulatory Visit (HOSPITAL_COMMUNITY): Payer: Self-pay

## 2020-12-09 LAB — SURGICAL PATHOLOGY

## 2020-12-09 MED ORDER — LOSARTAN POTASSIUM 50 MG PO TABS
50.0000 mg | ORAL_TABLET | Freq: Every day | ORAL | 0 refills | Status: DC
  Filled 2020-12-09: qty 90, 90d supply, fill #0

## 2020-12-09 MED ORDER — IBUPROFEN 600 MG PO TABS
600.0000 mg | ORAL_TABLET | Freq: Four times a day (QID) | ORAL | 0 refills | Status: DC
  Filled 2020-12-09: qty 30, 8d supply, fill #0

## 2020-12-09 MED ORDER — SIMETHICONE 80 MG PO CHEW
80.0000 mg | CHEWABLE_TABLET | ORAL | 0 refills | Status: DC | PRN
Start: 2020-12-09 — End: 2021-03-15
  Filled 2020-12-09: qty 30, 7d supply, fill #0

## 2020-12-09 MED ORDER — SENNOSIDES-DOCUSATE SODIUM 8.6-50 MG PO TABS
2.0000 | ORAL_TABLET | Freq: Every evening | ORAL | 0 refills | Status: DC | PRN
Start: 2020-12-09 — End: 2021-03-15
  Filled 2020-12-09: qty 30, 15d supply, fill #0

## 2020-12-09 MED ORDER — LEVOTHYROXINE SODIUM 25 MCG PO TABS
25.0000 ug | ORAL_TABLET | Freq: Every morning | ORAL | 0 refills | Status: DC
  Filled 2020-12-09: qty 60, 60d supply, fill #0

## 2020-12-09 MED ORDER — WITCH HAZEL-GLYCERIN EX PADS
1.0000 "application " | MEDICATED_PAD | CUTANEOUS | 12 refills | Status: DC | PRN
  Filled 2020-12-09: qty 40, 7d supply, fill #0

## 2020-12-09 MED ORDER — ACETAMINOPHEN 325 MG PO TABS
650.0000 mg | ORAL_TABLET | ORAL | 0 refills | Status: DC | PRN
  Filled 2020-12-09: qty 30, 3d supply, fill #0

## 2020-12-09 NOTE — Lactation Note (Signed)
This note was copied from a baby's chart. Lactation Consultation Note  Patient Name: Madison Mcgrath [id number] Date: 12/09/2020 Reason for consult: Follow-up assessment;Primapara;1st time breastfeeding;Infant weight loss;Other (Comment) (1 % weight loss/ mom is exclusively pumping / per mom last pumped last night w/ LC . LC reviewed Supply and demand/ importance of being consistent w/ pumping 8-10 times a day. LC reviewed BF D/C teaching - see below.  Mom aware of LC resources after D/C.) Age:68 hours  Maternal Data    Feeding Mother's Current Feeding Choice: Breast Milk and Formula  LATCH Score                    Lactation Tools Discussed/Used - LC reminded mom she may be changing size of flanges when her milk comes in.  Tools: Pump;Flanges Flange Size: 24;21 Breast pump type: Double-Electric Breast Pump Pump Education: Milk Storage Pumping frequency: per mom only has that one time last evening  Interventions    Discharge Discharge Education: Engorgement and breast care;Warning signs for feeding baby Pump: DEBP;Personal WIC Program: Yes  Consult Status Consult Status: Complete Date: 12/09/20    [name] 12/09/2020, 10:07 AM

## 2020-12-10 ENCOUNTER — Encounter: Payer: 59 | Admitting: Obstetrics & Gynecology

## 2020-12-10 ENCOUNTER — Other Ambulatory Visit: Payer: 59

## 2020-12-10 ENCOUNTER — Telehealth: Payer: Self-pay | Admitting: *Deleted

## 2020-12-10 NOTE — Telephone Encounter (Signed)
Transition Care Management Unsuccessful Follow-up Telephone Call  Date of discharge and from where:  12/09/2020 [facility]  Attempts:  1st Attempt  Reason for unsuccessful TCM follow-up call:  Left voice message

## 2020-12-14 ENCOUNTER — Encounter: Payer: Self-pay | Admitting: *Deleted

## 2020-12-14 ENCOUNTER — Encounter: Payer: 59 | Admitting: Obstetrics & Gynecology

## 2020-12-16 ENCOUNTER — Telehealth: Payer: 59

## 2020-12-18 ENCOUNTER — Telehealth (HOSPITAL_COMMUNITY): Payer: Self-pay | Admitting: *Deleted

## 2020-12-18 NOTE — Telephone Encounter (Signed)
Phone voicemail message left to return nurse call.  Duffy Rhody, RN 12-18-2020 at 10:21am

## 2020-12-20 ENCOUNTER — Encounter: Payer: Self-pay | Admitting: Women's Health

## 2020-12-21 ENCOUNTER — Ambulatory Visit: Payer: 59 | Admitting: Nurse Practitioner

## 2020-12-21 ENCOUNTER — Ambulatory Visit: Payer: 59 | Admitting: Adult Health

## 2020-12-25 ENCOUNTER — Telehealth (INDEPENDENT_AMBULATORY_CARE_PROVIDER_SITE_OTHER): Payer: 59 | Admitting: *Deleted

## 2020-12-25 ENCOUNTER — Encounter: Payer: Self-pay | Admitting: *Deleted

## 2020-12-25 VITALS — BP 130/67

## 2020-12-25 DIAGNOSIS — Z0130 Encounter for examination of blood pressure without abnormal findings: Secondary | ICD-10-CM

## 2020-12-25 DIAGNOSIS — I10 Essential (primary) hypertension: Secondary | ICD-10-CM

## 2020-12-25 NOTE — Progress Notes (Addendum)
   NURSE VISIT- BLOOD PRESSURE CHECK  I connected withNAME@ on 12/25/2020 by telephone  and verified that I am speaking with the correct person using two identifiers.   I discussed the limitations of evaluation and management by telemedicine. The patient expressed understanding and agreed to proceed.  Nurse is at the office, and patient is at home.  SUBJECTIVE:  Madison Mcgrath is a 30 y.o. G30P0000 female here for BP check. She is postpartum, delivery date 12/07/20.   States she is pretty sure she "passed" a pinworm.    HYPERTENSION ROS:  Postpartum:  Severe headaches that don't go away with tylenol/other medicines: No  Visual changes (seeing spots/double/blurred vision) No  Severe pain under right breast breast or in center of upper chest No  Severe nausea/vomiting No  Taking medicines as instructed yes   OBJECTIVE:  BP 130/67 (BP Location: Right Arm, Patient Position: Sitting, Cuff Size: Normal)   Breastfeeding No   Appearance alert, well appearing, and in no distress and oriented to person, place, and time.  ASSESSMENT: Postpartum  blood pressure check  PLAN: Discussed with Dr. Despina Hidden   Recommendations: no changes needed.  Advised to take Reese's Pinworm over the counter from [employer]. Follow-up: as scheduled   Jobe Marker  12/25/2020 10:08 AM  Attestation of Attending Supervision of Nursing Visit Encounter: Evaluation and management procedures were performed by the nursing staff under my supervision and collaboration.  I have reviewed the nurse's note and chart, and I agree with the management and plan.  Rockne Coons MD Attending Physician for the Center for [facility] 03/07/2021 1:31 PM

## 2021-01-11 ENCOUNTER — Ambulatory Visit (INDEPENDENT_AMBULATORY_CARE_PROVIDER_SITE_OTHER): Payer: 59 | Admitting: Women's Health

## 2021-01-11 ENCOUNTER — Other Ambulatory Visit (HOSPITAL_COMMUNITY)
Admission: RE | Admit: 2021-01-11 | Discharge: 2021-01-11 | Disposition: A | Discharge status: Home / Self Care | Payer: 59 | Source: Ambulatory Visit | Attending: Women's Health | Admitting: Women's Health

## 2021-01-11 ENCOUNTER — Encounter: Payer: Self-pay | Admitting: Women's Health

## 2021-01-11 ENCOUNTER — Other Ambulatory Visit: Payer: Self-pay

## 2021-01-11 DIAGNOSIS — Z8632 Personal history of gestational diabetes: Secondary | ICD-10-CM | POA: Diagnosis not present

## 2021-01-11 DIAGNOSIS — I10 Essential (primary) hypertension: Secondary | ICD-10-CM | POA: Diagnosis not present

## 2021-01-11 DIAGNOSIS — N898 Other specified noninflammatory disorders of vagina: Secondary | ICD-10-CM | POA: Diagnosis present

## 2021-01-11 DIAGNOSIS — F418 Other specified anxiety disorders: Secondary | ICD-10-CM

## 2021-01-11 MED ORDER — NORETHIN ACE-ETH ESTRAD-FE 1-20 MG-MCG PO TABS: 1.0000 | ORAL_TABLET | Freq: Every day | ORAL | 3 refills | Status: DC

## 2021-01-11 NOTE — Progress Notes (Signed)
POSTPARTUM VISIT Patient name: Madison Mcgrath MRN [id number]  Date of birth: 09/16/1990 Chief Complaint:   Postpartum Care  History of Present Illness:   Madison Mcgrath is a 30 y.o. G4P0001 Caucasian female being seen today for a postpartum visit. She is 5 weeks postpartum following a spontaneous vaginal delivery at 39.2 gestational weeks. IOL: yes, for chronic hypertension  and diabetes mellitus A2/BDM . Anesthesia: epidural.  Laceration: labial.  Complications: none. Inpatient contraception: no.   Pregnancy complicated by [facility] and W0/JWJ . Tobacco use: no. Substance use disorder: no. Last pap smear: 10/16/18 and results were NILM w/ HRHPV negative. Next pap smear due: 2023 Patient's last menstrual period was 01/10/2021 (exact date).  Postpartum course has been uncomplicated. Bleeding stopped couple of weeks ago, now heavy, started today, put 4 thin pads on this am, they are soiled . Denies abnormal discharge, itching/odor/irritation.  Partner says he's noticed an odor.  Bowel function is  constipation . Thinks she saw a tape worm on stool the other day, 'looked like a spaghetti noodle'. Bladder function is normal. Urinary incontinence? no, fecal incontinence? no Patient is sexually active. Desired contraception: OCPs. Patient does want a pregnancy in the future.  Desired family size is 3 children.   Upstream - 01/11/21 1151       Pregnancy Intention Screening   Does the patient want to become pregnant in the next year? Unsure    Does the patient's partner want to become pregnant in the next year? Unsure    Would the patient like to discuss contraceptive options today? Yes      Contraception Wrap Up   Current Method No Contraceptive Precautions    End Method Oral Contraceptive    Contraception Counseling Provided Yes            The pregnancy intention screening data noted above was reviewed. Potential methods of contraception were discussed. The patient elected to proceed with Oral  Contraceptive.  Edinburgh Postpartum Depression Screening: negative, doing well on lamictal, sees [facility] specialist virtually through [facility], has appt next week. Occ thoughts of harming self, no plan, able to redirect her thoughts. No HI/II.   Edinburgh Postnatal Depression Scale - 01/11/21 1148       Edinburgh Postnatal Depression Scale:  In the Past 7 Days   I have been able to laugh and see the funny side of things. 0    I have looked forward with enjoyment to things. 0    I have blamed myself unnecessarily when things went wrong. 0    I have been anxious or worried for no good reason. 0    I have felt scared or panicky for no good reason. 0    Things have been getting on top of me. 0    I have been so unhappy that I have had difficulty sleeping. 0    I have felt sad or miserable. 0    I have been so unhappy that I have been crying. 0    The thought of harming myself has occurred to me. 0    Edinburgh Postnatal Depression Scale Total 0             GAD 7 : Generalized Anxiety Score 06/18/2020  Nervous, Anxious, on Edge 0  Control/stop worrying 1  Worry too much - different things 1  Trouble relaxing 0  Restless 0  Easily annoyed or irritable 1  Afraid - awful might happen 0  Total GAD 7  Score 3     Baby's course has been uncomplicated. Baby is feeding by bottle. Infant has a pediatrician/family doctor? Yes.  Childcare strategy if returning to work/school:  did not discuss .  Pt has material needs met for her and baby: did not discuss Review of Systems:   Pertinent items are noted in HPI Denies Abnormal vaginal discharge w/ itching/odor/irritation, headaches, visual changes, shortness of breath, chest pain, abdominal pain, severe nausea/vomiting, or problems with urination or bowel movements. Pertinent History Reviewed:  Reviewed past medical,surgical, obstetrical and family history.  Reviewed problem list, medications and allergies. OB History  Gravida Para Term Preterm  AB Living  1 0 0 0 0 1  SAB IAB Ectopic Multiple Live Births  0 0 0 0 1    # Outcome Date GA Lbr Len/2nd Weight Sex Delivery Anes PTL Lv  1 Gravida            Physical Assessment:   Vitals:   01/11/21 1213  BP: 126/85  Pulse: 69  Weight: (!) 314 lb (142.4 kg)  Height: 5' 5" (1.651 m)  Body mass index is 52.25 kg/m.       Physical Examination:   General appearance: alert, well appearing, and in no distress  Mental status: alert, oriented to person, place, and time  Skin: warm & dry   Cardiovascular: normal heart rate noted   Respiratory: normal respiratory effort, no distress   Breasts: deferred, no complaints   Abdomen: soft, non-tender   Pelvic: vulva- lacs well healed, blind CV swab obtained. Thin prep pap obtained: No  Rectal: not examined  Extremities: Edema: none   Chaperone: Angel Neas         No results found for this or any previous visit (from the past 24 hour(s)).  Assessment & Plan:  1) Postpartum exam 2) 5 wks s/p spontaneous vaginal delivery after IOL for CHTN, A2/BDM 3) bottle feeding 4) Depression screening 5) Contraception management: rx Junel (insurance won't cover LoLo), has appt w/ PCP 1/6- will check bp, and will f/u here in 28mhs for med f/u 6) Dep/anx/bipolar> doing well on lamictal, occ thoughts of self-harm, no plan, redirects her thoughts. [facility] keep virtual appt next week. Promises to go to ED if begins to think she will act on thoughts. Gave [facility] info/24hr crisis line in case needs it 7) ?recent tape worm> keep eye on stool, if anymore worms contact PCP 8) A2/BDM> 2hr GTT in 3wks 9) CHTN> controlled on meds 10) Heavier vag bleeding/odor> today, CV swab sent, discussed 1st period can be heavier, if changing more than 1pad/hr, large clots, etc let uKoreaknow  Essential components of care per ACOG recommendations:  1.  Mood and well being:  If positive depression screen, discussed and plan developed.  If using  tobacco we discussed reduction/cessation and risk of relapse If current substance abuse, we discussed and referral to local resources was offered.   2. Infant care and feeding:  If breastfeeding, discussed returning to work, pumping, breastfeeding-associated pain, guidance regarding return to fertility while lactating if not using another method. If needed, patient was provided with a letter to be allowed to pump q 2-3hrs to support lactation in a private location with access to a refrigerator to store breastmilk.   Recommended that all caregivers be immunized for flu, pertussis and other preventable communicable diseases If pt does not have material needs met for her/baby, referred to local resources for help obtaining these.  3.  Sexuality, contraception and birth spacing Provided guidance regarding sexuality, management of dyspareunia, and resumption of intercourse Discussed avoiding interpregnancy interval <31mhs and recommended birth spacing of 18 months  4. Sleep and fatigue Discussed coping options for fatigue and sleep disruption Encouraged family/partner/community support of 4 hrs of uninterrupted sleep to help with mood and fatigue  5. Physical recovery  If pt had a C/S, assessed incisional pain and providing guidance on normal vs prolonged recovery If pt had a laceration, perineal healing and pain reviewed.  If urinary or fecal incontinence, discussed management and referred to PT or uro/gyn if indicated  Patient is safe to resume physical activity. Discussed attainment of healthy weight.  6.  Chronic disease management Discussed pregnancy complications if any, and their implications for future childbearing and long-term maternal health. Review recommendations for prevention of recurrent pregnancy complications, such as 17 hydroxyprogesterone caproate to reduce risk for recurrent PTB not applicable, or aspirin to reduce risk of preeclampsia not applicable. Pt had GDM: yes. If yes,  2hr GTT scheduled: yes. Reviewed medications and non-pregnant dosing including consideration of whether pt is breastfeeding using a reliable resource such as LactMed: not applicable Referred for f/u w/ PCP or subspecialist providers as indicated: yes  7. Health maintenance Mammogram at [place]yoor earlier if indicated Pap smears as indicated  Meds:  Meds ordered this encounter  Medications   norethindrone-ethinyl estradiol-FE (JUNEL FE 1/20) 1-20 MG-MCG tablet    Sig: Take 1 tablet by mouth daily.    Dispense:  90 tablet    Refill:  3    Order Specific Question:   Supervising Provider    Answer:   EFlorian Buff[[id number]]    Follow-up: Return in about 3 months (around 04/11/2021) for med f/u, CNM, in person; 3wks 2hr sugar test (no visit).   No orders of the defined types were placed in this encounter.   [facility]12/19/2022 1:31 PM

## 2021-01-11 NOTE — Addendum Note (Signed)
Addended by: Leilani Able, Knute Mazzuca A on: 01/11/2021 01:40 PM   Modules accepted: Orders

## 2021-01-11 NOTE — Patient Instructions (Signed)
Constipation Drink plenty of fluid, preferably water, throughout the day Eat foods high in fiber such as fruits, vegetables, and grains Exercise, such as walking, is a good way to keep your bowels regular Drink warm fluids, especially warm prune juice, or decaf coffee Eat a 1/2 cup of real oatmeal (not instant), 1/2 cup applesauce, and 1/2-1 cup warm prune juice every day If needed, you may take Colace (docusate sodium) stool softener once or twice a day to help keep the stool soft. If you are pregnant, wait until you are out of your first trimester (12-14 weeks of pregnancy) If you still are having problems with constipation, you may take Miralax once daily as needed to help keep your bowels regular.  If you are pregnant, wait until you are out of your first trimester (12-14 weeks of pregnancy)   [facility]  [address] [place], [place] [address] Phone: [phone] Hours: Mon-Fri 8AM-5PM  If you're a walk-in patient there is generally a wait time involved on a "first come, first served basis" otherwise contact us beforehand to setup an appointment. If you're in crisis please use our 24-Hour Crisis Hotline for immediate access to a clinician. 24 Hour Crisis Line: [phone]   If this is your first time please bring the following with you: Insurance/Medicaid Card ID or Social Security Card Any referral documentation  Payment Options Individuals with Medicaid have no obligation for a copay. Individuals with Medicare or private insurance will be obligated to meet their policy's requirement(s). Individuals who are uninsured will be eligible for a sliding or discounted scaled as defined by the relevant Managed Care Organization/Local Management Entity  Services:  Substance Abuse Outpatient Treatment Mental Health Outpatient Treatment Psychiatric Services/ Medical Services Advanced Access/Walk-In Clinic Mobile Crisis Management Services ACTT (Assertive Community Treatment  Team) Dialectic Behavioral Therapy Critical Time Intervention Psychosocial Rehabilitation [facility]) Medication Assisted Treatment

## 2021-01-13 LAB — CERVICOVAGINAL ANCILLARY ONLY
Bacterial Vaginitis (gardnerella): POSITIVE — AB
Candida Glabrata: NEGATIVE
Candida Vaginitis: NEGATIVE
Chlamydia: NEGATIVE
Comment: NEGATIVE
Comment: NEGATIVE
Comment: NEGATIVE
Comment: NEGATIVE
Comment: NEGATIVE
Comment: NORMAL
Neisseria Gonorrhea: NEGATIVE
Trichomonas: NEGATIVE

## 2021-01-14 MED ORDER — METRONIDAZOLE 500 MG PO TABS: 500.0000 mg | ORAL_TABLET | Freq: Two times a day (BID) | ORAL | 0 refills | Status: DC

## 2021-01-14 NOTE — Addendum Note (Signed)
Addended by: Shawna Clamp R on: 01/14/2021 12:50 PM   Modules accepted: Orders

## 2021-01-26 ENCOUNTER — Other Ambulatory Visit (HOSPITAL_COMMUNITY): Payer: Self-pay

## 2021-01-29 ENCOUNTER — Encounter: Payer: 59 | Admitting: Nurse Practitioner

## 2021-01-29 ENCOUNTER — Encounter: Payer: Self-pay | Admitting: Nurse Practitioner

## 2021-01-29 ENCOUNTER — Ambulatory Visit (INDEPENDENT_AMBULATORY_CARE_PROVIDER_SITE_OTHER): Payer: 59 | Admitting: Nurse Practitioner

## 2021-01-29 ENCOUNTER — Other Ambulatory Visit: Payer: Self-pay

## 2021-01-29 VITALS — BP 117/79 | HR 86 | Temp 98.7°F | Ht 65.0 in | Wt 314.1 lb

## 2021-01-29 DIAGNOSIS — Z0001 Encounter for general adult medical examination with abnormal findings: Secondary | ICD-10-CM

## 2021-01-29 DIAGNOSIS — G43709 Chronic migraine without aura, not intractable, without status migrainosus: Secondary | ICD-10-CM | POA: Diagnosis not present

## 2021-01-29 DIAGNOSIS — Z0000 Encounter for general adult medical examination without abnormal findings: Secondary | ICD-10-CM | POA: Insufficient documentation

## 2021-01-29 DIAGNOSIS — I10 Essential (primary) hypertension: Secondary | ICD-10-CM

## 2021-01-29 DIAGNOSIS — M545 Low back pain, unspecified: Secondary | ICD-10-CM

## 2021-01-29 DIAGNOSIS — G8929 Other chronic pain: Secondary | ICD-10-CM

## 2021-01-29 DIAGNOSIS — Z6841 Body Mass Index (BMI) 40.0 and over, adult: Secondary | ICD-10-CM

## 2021-01-29 MED ORDER — UBRELVY 50 MG PO TABS: ORAL_TABLET | ORAL | 1 refills | Status: DC

## 2021-01-29 MED ORDER — DICLOFENAC SODIUM 75 MG PO TBEC: 75.0000 mg | DELAYED_RELEASE_TABLET | Freq: Every day | ORAL | 0 refills | Status: DC | PRN

## 2021-01-29 NOTE — Assessment & Plan Note (Signed)
Annual exam as documented.  ?Counseling done include healthy lifestyle involving committing to 150 minutes of exercise per week, heart healthy diet, and attaining healthy weight. The importance of adequate sleep also discussed.  ?Regular use of seat belt and home safety were also discussed . ?Changes in health habits are decided on by patient with goals and time frames set for achieving them. ?Immunization and cancer screening  needs are specifically addressed at this visit.   ?

## 2021-01-29 NOTE — Assessment & Plan Note (Signed)
DASH diet and commitment to daily physical activity for a minimum of 30 minutes discussed and encouraged, as a part of hypertension management. The importance of attaining a healthy weight is also discussed.  BP/Weight 01/29/2021 01/11/2021 12/25/2020 12/09/2020 12/05/2020 11/26/2020 11/19/2020  Systolic BP 117 126 130 113 - 527 138  Diastolic BP 79 85 67 72 - 78 70  Wt. (Lbs) 314.08 314 - - 337.5 336.8 335.4  BMI 52.27 52.25 - - 56.16 56.05 55.81  continue losartan 50mg  daily.

## 2021-01-29 NOTE — Progress Notes (Addendum)
Established Patient Office Visit  Subjective:  Patient ID: Madison Mcgrath, female    DOB: 03/30/1990  Age: 31 y.o. MRN: [id number]  CC: No chief complaint on file.   HPI Jilliane Marcellus ScottM Kennard presents for annual exam. Pt stated that she has been having increased low back pain since she had her baby two months ago. She has chronic low back pain but her pain got worse after she got epidural injection during child delivery, ibuprofen helps her pain, she sometimes usses her mom;s oxycodone.   Pt c/o of chronic migraine HA, she was previously taking diclofenac sodium and  ubrelvy , they helped her HA. She would like to go back on the medicines.   Pt encouraged to get her covid vaccine, she has had TDAP vaccine in the past 5 years .  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   Thyroid disease    Vaginal Pap smear, abnormal     Past Surgical History:  Procedure Laterality Date   CHOLECYSTECTOMY     LEG SURGERY Left    stick removed from leg   NO PAST SURGERIES      Family History  Problem Relation Age of Onset   High blood pressure Mother    Osteoporosis Mother    Dementia Mother    Arthritis Father    Diabetes Maternal Uncle    Cancer Maternal Grandmother    Ovarian cancer Maternal Grandmother    Diabetes Maternal Grandfather    Colon cancer Paternal Grandmother    Kidney failure Paternal Grandmother    Cleft lip Other    Autism Other    Bipolar disorder Other    Breast cancer Neg Hx     Social History   Socioeconomic History   Marital status: Single    Spouse name: Not on file   Number of children: 1   Years of education: Not on file   Highest education level: Not on file  Occupational History   Not on file  Tobacco Use   Smoking status: Never   Smokeless tobacco: Never   Vaping Use   Vaping Use: Former  Substance and Sexual Activity   Alcohol use: Not Currently   Drug use: Never   Sexual activity: Yes    Birth control/protection: None, Pill  Other Topics Concern   Not on file  Social History Narrative   Lives with her boyfriend and daughter.    Social Determinants of Health   Financial Resource Strain: Low Risk    Difficulty of Paying Living Expenses: Not hard at all  Food Insecurity: No Food Insecurity   Worried About Programme researcher, broadcasting/film/videounning Out of Food in the Last Year: Never true   Baristaan Out of Food in the Last Year: Never true  Transportation Needs: Unmet Transportation Needs   Lack of Transportation (Medical): No   Lack of Transportation (Non-Medical): Yes  Physical Activity: Inactive   Days of Exercise per Week: 0 days   Minutes of Exercise per Session: 0 min  Stress: Stress Concern Present   Feeling of Stress : Rather much  Social Connections: Socially Isolated   Frequency of Communication with Friends and Family: Three times a week   Frequency of Social Gatherings with Friends and Family: Twice a week   Attends Religious Services: Never  Active Member of Clubs or Organizations: No   Attends Banker Meetings: Never   Marital Status: Never married  Catering manager Violence: Not At Risk   Fear of Current or Ex-Partner: No   Emotionally Abused: No   Physically Abused: No   Sexually Abused: No    Outpatient Medications Prior to Visit  Medication Sig Dispense Refill   acetaminophen (TYLENOL) 325 MG tablet Take 2 tablets (650 mg total) by mouth every 4 (four) hours as needed (for pain scale < 4). 30 tablet 0   lamoTRIgine (LAMICTAL) 200 MG tablet Take 1 tablet by mouth daily.     lamoTRIgine (LAMICTAL) 25 MG tablet Take 25 mg by mouth. Take 2 with 200 mg for a total of 250 mg     levothyroxine (SYNTHROID) 25 MCG tablet Take 1 tablet (25 mcg total) by mouth every morning. 90 tablet 0   losartan (COZAAR) 50 MG tablet Take 1 tablet (50 mg  total) by mouth daily. 90 tablet 0   norethindrone-ethinyl estradiol-FE (JUNEL FE 1/20) 1-20 MG-MCG tablet Take 1 tablet by mouth daily. 90 tablet 3   omeprazole (PRILOSEC OTC) 20 MG tablet Take 1 tablet (20 mg total) by mouth daily. 30 tablet 6   senna-docusate (SENOKOT-S) 8.6-50 MG tablet Take 2 tablets by mouth at bedtime as needed for mild constipation. 30 tablet 0   simethicone (MYLICON) 80 MG chewable tablet Chew 1 tablet (80 mg total) by mouth as needed for flatulence. 30 tablet 0   Prenatal Vit-Fe Fumarate-FA (PRENATAL VITAMIN) 27-0.8 MG TABS Take 1 tablet by mouth every morning. (Patient not taking: Reported on 01/29/2021) 100 tablet 1   witch hazel-glycerin (TUCKS) pad Apply 1 application topically as needed for hemorrhoids. (Patient not taking: Reported on 12/25/2020) 40 each 12   ibuprofen (ADVIL) 600 MG tablet Take 1 tablet (600 mg total) by mouth every 6 (six) hours. (Patient not taking: Reported on 01/29/2021) 30 tablet 0   metroNIDAZOLE (FLAGYL) 500 MG tablet Take 1 tablet (500 mg total) by mouth 2 (two) times daily. 14 tablet 0   No facility-administered medications prior to visit.    Allergies  Allergen Reactions   Bee Venom Rash    ROS Review of Systems  Constitutional: Negative.   HENT: Negative.    Eyes: Negative.   Respiratory: Negative.    Cardiovascular: Negative.   Gastrointestinal: Negative.   Endocrine: Negative.   Genitourinary: Negative.   Musculoskeletal:  Positive for back pain.  Skin: Negative.   Allergic/Immunologic: Negative.   Neurological:  Positive for headaches.  Hematological: Negative.   Psychiatric/Behavioral: Negative.       Objective:   Shirlee Limerick was present as chaperone  Physical Exam Vitals reviewed. Exam conducted with a chaperone present.  Constitutional:      General: She is not in acute distress.    Appearance: She is obese. She is not ill-appearing, toxic-appearing or diaphoretic.  HENT:     Right Ear: Tympanic membrane, ear canal  and external ear normal. There is no impacted cerumen.     Left Ear: Tympanic membrane, ear canal and external ear normal. There is no impacted cerumen.     Nose: No congestion or rhinorrhea.     Mouth/Throat:     Mouth: Mucous membranes are moist.     Pharynx: No oropharyngeal exudate or posterior oropharyngeal erythema.  Eyes:     General: No scleral icterus.       Right eye: No discharge.  Left eye: No discharge.     Extraocular Movements: Extraocular movements intact.     Conjunctiva/sclera: Conjunctivae normal.  Neck:     Vascular: No carotid bruit.  Cardiovascular:     Rate and Rhythm: Normal rate and regular rhythm.     Pulses: Normal pulses.     Heart sounds: No murmur heard.   No friction rub. No gallop.  Pulmonary:     Effort: Pulmonary effort is normal. No respiratory distress.     Breath sounds: Normal breath sounds. No stridor. No wheezing, rhonchi or rales.  Chest:     Chest wall: No mass, lacerations, deformity, swelling or tenderness.  Breasts:    Tanner Score is 5.     Right: Normal. No swelling, bleeding, inverted nipple, mass, nipple discharge, skin change or tenderness.     Left: Normal. No swelling, bleeding, inverted nipple, mass, nipple discharge, skin change or tenderness.     Comments: Skin under left breast bruised Abdominal:     General: There is no distension.     Palpations: Abdomen is soft. There is no mass.     Tenderness: There is no abdominal tenderness. There is no right CVA tenderness, left CVA tenderness, guarding or rebound.     Hernia: No hernia is present.  Musculoskeletal:        General: No swelling, tenderness, deformity or signs of injury.     Cervical back: No rigidity or tenderness.     Right lower leg: No edema.     Left lower leg: No edema.  Lymphadenopathy:     Cervical: No cervical adenopathy.     Upper Body:     Right upper body: No supraclavicular, axillary or pectoral adenopathy.     Left upper body: No  supraclavicular, axillary or pectoral adenopathy.  Skin:    Capillary Refill: Capillary refill takes less than 2 seconds.     Coloration: Skin is not jaundiced or pale.     Findings: No bruising, erythema, lesion or rash.  Neurological:     Mental Status: She is alert and oriented to person, place, and time.     Cranial Nerves: No cranial nerve deficit.     Sensory: No sensory deficit.     Motor: No weakness.     Coordination: Coordination normal.     Gait: Gait normal.     Deep Tendon Reflexes: Reflexes normal.  Psychiatric:        Mood and Affect: Mood normal.        Behavior: Behavior normal.        Thought Content: Thought content normal.        Judgment: Judgment normal.    BP 117/79    Pulse 86    Temp 98.7 F (37.1 C)    Ht 5\' 5"  (1.651 m)    Wt (!) 314 lb 1.3 oz (142.5 kg)    LMP 01/10/2021 (Exact Date)    SpO2 97%    BMI 52.27 kg/m  Wt Readings from Last 3 Encounters:  01/29/21 (!) 314 lb 1.3 oz (142.5 kg)  01/11/21 (!) 314 lb (142.4 kg)  12/05/20 (!) 337 lb 8 oz (153.1 kg)     Health Maintenance Due  Topic Date Due   COVID-19 Vaccine (1) Never done   Pneumococcal Vaccine 37-55 Years old (1 - PCV) Never done   TETANUS/TDAP  Never done    There are no preventive care reminders to display for this patient.  Lab Results  Component Value  Date   TSH 1.470 10/08/2020   Lab Results  Component Value Date   WBC 19.5 (H) 12/07/2020   HGB 12.7 12/07/2020   HCT 39.7 12/07/2020   MCV 80.9 12/07/2020   PLT 249 12/07/2020   Lab Results  Component Value Date   NA 136 12/05/2020   K 3.9 12/05/2020   CO2 20 (L) 12/05/2020   GLUCOSE 162 (H) 12/05/2020   BUN 6 12/05/2020   CREATININE 0.64 12/05/2020   BILITOT 0.4 12/05/2020   ALKPHOS 122 12/05/2020   AST 21 12/05/2020   ALT 13 12/05/2020   PROT 6.4 (L) 12/05/2020   ALBUMIN 2.6 (L) 12/05/2020   CALCIUM 9.4 12/05/2020   ANIONGAP 11 12/05/2020   No results found for: CHOL No results found for: HDL No results  found for: LDLCALC No results found for: TRIG No results found for: CHOLHDL Lab Results  Component Value Date   HGBA1C 6.4 (H) 06/04/2020      Assessment & Plan:   Problem List Items Addressed This Visit       Other   Annual physical exam    Annual exam as documented.  Counseling done include healthy lifestyle involving committing to 150 minutes of exercise per week, heart healthy diet, and attaining healthy weight. The importance of adequate sleep also discussed.  Regular use of seat belt and home safety were also discussed . Changes in health habits are decided on by patient with goals and time frames set for achieving them. Immunization and cancer screening  needs are specifically addressed at this visit.        Other Visit Diagnoses     Chronic migraine without aura without status migrainosus, not intractable    -  Primary   Relevant Medications   diclofenac (VOLTAREN) 75 MG EC tablet   Ubrogepant (UBRELVY) 50 MG TABS       Meds ordered this encounter  Medications   diclofenac (VOLTAREN) 75 MG EC tablet    Sig: Take 1 tablet (75 mg total) by mouth daily as needed.    Dispense:  30 tablet    Refill:  0   Ubrogepant (UBRELVY) 50 MG TABS    Sig: May repeat dose after more than  2 hours,not more than 200mg  dose in 24 hours    Dispense:  30 tablet    Refill:  1    Follow-up: No follow-ups on file.    Donell BeersFolashade R Quinette Hentges, FNP

## 2021-01-29 NOTE — Assessment & Plan Note (Signed)
Importance of healthy food choices with portion control discussed as well as eating regularly within 12  hour window.   The need to choose clean green food 50%-75% of time is discussed as well as make water the primary drink and set a goal for 64 ounces daily.  Patient reeducated about the importance of committment to minimum of 150 minutes of exercise per week.  Three meals at set times with snacks allowed between meals but they must be fruit or vegetable.   Aim to eat  over 12 hour period  for example 7 am to 7 pm. Stop after your last meal of the day.  Wt Readings from Last 3 Encounters:  01/29/21 (!) 314 lb 1.3 oz (142.5 kg)  01/11/21 (!) 314 lb (142.4 kg)  12/05/20 (!) 337 lb 8 oz (153.1 kg)

## 2021-01-29 NOTE — Patient Instructions (Addendum)
Please get your labs done on Monday. Take diclofenac sodium as needed for your migraine. Take Bernita Raisin as needed for your migraine .  Please get your covid vaccine at your pharmacy.    It is important that you exercise regularly at least 30 minutes 5 times a week.  Think about what you will eat, plan ahead. Choose " clean, green, fresh or frozen" over canned, processed or packaged foods which are more sugary, salty and fatty. 70 to 75% of food eaten should be vegetables and fruit. Three meals at set times with snacks allowed between meals, but they must be fruit or vegetables. Aim to eat over a 12 hour period , example 7 am to 7 pm, and STOP after  your last meal of the day. Drink water,generally about 64 ounces per day, no other drink is as healthy. Fruit juice is best enjoyed in a healthy way, by EATING the fruit.  Thanks for choosing [facility], we consider it a privelige to serve you.

## 2021-01-29 NOTE — Assessment & Plan Note (Signed)
Start diclofenac 75mg  daily PRN Ubrelvy 50mg   as needed.

## 2021-01-30 DIAGNOSIS — G8929 Other chronic pain: Secondary | ICD-10-CM | POA: Insufficient documentation

## 2021-01-30 DIAGNOSIS — M545 Low back pain, unspecified: Secondary | ICD-10-CM | POA: Insufficient documentation

## 2021-01-30 NOTE — Assessment & Plan Note (Addendum)
Continue tylenol as needed, loose weight  Take diclofenac sodium PRN, PT educated on the need to eat before taking med to prevent GI upset, she verbalized understanding.

## 2021-01-30 NOTE — Addendum Note (Signed)
Addended by: Donell Beers on: 01/30/2021 05:17 AM   Modules accepted: Orders

## 2021-02-01 ENCOUNTER — Encounter: Payer: Self-pay | Admitting: Nurse Practitioner

## 2021-02-02 ENCOUNTER — Other Ambulatory Visit: Payer: 59

## 2021-02-02 DIAGNOSIS — Z131 Encounter for screening for diabetes mellitus: Secondary | ICD-10-CM

## 2021-02-02 DIAGNOSIS — Z8632 Personal history of gestational diabetes: Secondary | ICD-10-CM

## 2021-02-03 ENCOUNTER — Ambulatory Visit: Payer: 59 | Admitting: Nurse Practitioner

## 2021-02-19 ENCOUNTER — Encounter: Payer: Self-pay | Admitting: Women's Health

## 2021-03-01 ENCOUNTER — Ambulatory Visit: Payer: 59 | Admitting: Women's Health

## 2021-03-15 ENCOUNTER — Other Ambulatory Visit (HOSPITAL_COMMUNITY)
Admission: RE | Admit: 2021-03-15 | Discharge: 2021-03-15 | Disposition: A | Discharge status: Home / Self Care | Payer: 59 | Source: Ambulatory Visit | Attending: Women's Health | Admitting: Women's Health

## 2021-03-15 ENCOUNTER — Other Ambulatory Visit: Payer: Self-pay

## 2021-03-15 ENCOUNTER — Encounter: Payer: Self-pay | Admitting: Women's Health

## 2021-03-15 ENCOUNTER — Ambulatory Visit (INDEPENDENT_AMBULATORY_CARE_PROVIDER_SITE_OTHER): Payer: 59 | Admitting: Women's Health

## 2021-03-15 VITALS — BP 126/77 | HR 97 | Ht 65.0 in | Wt 321.6 lb

## 2021-03-15 DIAGNOSIS — F319 Bipolar disorder, unspecified: Secondary | ICD-10-CM

## 2021-03-15 DIAGNOSIS — N926 Irregular menstruation, unspecified: Secondary | ICD-10-CM | POA: Diagnosis present

## 2021-03-15 DIAGNOSIS — E039 Hypothyroidism, unspecified: Secondary | ICD-10-CM | POA: Diagnosis not present

## 2021-03-15 DIAGNOSIS — R632 Polyphagia: Secondary | ICD-10-CM | POA: Diagnosis not present

## 2021-03-15 NOTE — Progress Notes (Signed)
GYN VISIT Patient name: Madison Mcgrath MRN [id number]  Date of birth: 02/21/1990 Chief Complaint:   Menstrual Problem  History of Present Illness:   Madison Mcgrath is a 31 y.o. G22P0001 Caucasian female s/p SVB being seen today for report of irregular periods since delivery. Bled from delivery for a couple weeks, stopped, then started back at 5wks, we did a CV swab at her pp visit which showed BV, we treated it. However she states from that point on she would bleed for a week or so, stop for a week, then start back. Has been that way until about a month ago when bleeding stopped. Was heavy at times, had to put a wash cloth in underwear to catch blood. Takes Junel as rx'd, hasn't missed/skipped/been late for any pills. Bottlefeeding. Increased appetite. Feels she has some depression. Denies SI/HI/II. Has h/o bipolar, is on lamictal. Sees '[facility]', but was told she needed to find a different provider now to manage meds/do therapy.  No LMP recorded (lmp unknown). (Menstrual status: Irregular Periods). The current method of family planning is OCP (estrogen/progesterone).  Last pap 10/16/18. Results were: NILM w/ HRHPV negative  Depression screen [facility] 2/9 03/15/2021 10/14/2020 07/29/2020 07/08/2020 06/18/2020  Decreased Interest 0 0 0 0 0  Down, Depressed, Hopeless 0 0 1 0 1  PHQ - 2 Score 0 0 1 0 1  Altered sleeping 0 - - - 0  Tired, decreased energy 3 - - - 1  Change in appetite 3 - - - 1  Feeling bad or failure about yourself  0 - - - 0  Trouble concentrating 2 - - - 0  Moving slowly or fidgety/restless 0 - - - 0  Suicidal thoughts 0 - - - 0  PHQ-9 Score 8 - - - 3     GAD 7 : Generalized Anxiety Score 03/15/2021 06/18/2020  Nervous, Anxious, on Edge 1 0  Control/stop worrying 0 1  Worry too much - different things 2 1  Trouble relaxing 0 0  Restless 0 0  Easily annoyed or irritable 3 1  Afraid - awful might happen 2 0  Total GAD 7 Score 8 3     Review of Systems:   Pertinent items are noted  in HPI Denies fever/chills, dizziness, headaches, visual disturbances, fatigue, shortness of breath, chest pain, abdominal pain, vomiting, abnormal vaginal discharge/itching/odor/irritation, problems with periods, bowel movements, urination, or intercourse unless otherwise stated above.  Pertinent History Reviewed:  Reviewed past medical,surgical, social, obstetrical and family history.  Reviewed problem list, medications and allergies. Physical Assessment:   Vitals:   03/15/21 1003 03/15/21 1011  BP: (!) 154/71 126/77  Pulse: 96 97  Weight: (!) 321 lb 9.6 oz (145.9 kg)   Height: 5\' 5"  (1.651 m)   Body mass index is 53.52 kg/m.       Physical Examination:   General appearance: alert, well appearing, and in no distress  Mental status: alert, oriented to person, place, and time  Skin: warm & dry   Cardiovascular: normal heart rate noted  Respiratory: normal respiratory effort, no distress  Abdomen: soft, non-tender   Pelvic: VULVA: normal appearing vulva with no masses, tenderness or lesions, VAGINA: normal appearing vagina with normal color and discharge, no lesions, CERVIX: normal appearing cervix without discharge or lesions  Extremities: no edema   Chaperone: Angel Neas    No results found for this or any previous visit (from the past 24 hour(s)).  Assessment & Plan:  1)  Irregular bleeding> currently stopped x , CV swab sent. Let me know if returns and is irregular again. Discussed changing COC vs changing to different birth control  2) Increased appetite> discussed could be hormonal, related to depression/mood swings, etc  3) Bipolar> on lamictal, currently seeing a provider who tells her she needs to establish care w/ someone else, gave [facility] info- call today for appt, can also do walk-in if needed  4) Hypothyroidism> on synthroid, will check TSH as this can impact bleeding/mood, etc  Meds: No orders of the defined types were placed in this encounter.   Orders  Placed This Encounter  Procedures   TSH    Return in about 6 months (around 09/12/2021) for Pap & physical.  Cheral Marker CNM, WHNP-BC 03/15/2021 11:00 AM

## 2021-03-15 NOTE — Patient Instructions (Signed)
[facility]  [address] [place], [place] [address] Phone: [phone] Hours: Mon-Fri 8AM-5PM  If you're a walk-in patient there is generally a wait time involved on a "first come, first served basis" otherwise contact us beforehand to setup an appointment. If you're in crisis please use our 24-Hour Crisis Hotline for immediate access to a clinician. 24 Hour Crisis Line: [phone]   If this is your first time please bring the following with you: Insurance/Medicaid Card ID or Social Security Card Any referral documentation  Payment Options Individuals with Medicaid have no obligation for a copay. Individuals with Medicare or private insurance will be obligated to meet their policy's requirement(s). Individuals who are uninsured will be eligible for a sliding or discounted scaled as defined by the relevant Managed Care Organization/Local Management Entity  Services:  Substance Abuse Outpatient Treatment Mental Health Outpatient Treatment Psychiatric Services/ Medical Services Advanced Access/Walk-In Clinic Mobile Crisis Management Services ACTT (Assertive Community Treatment Team) Dialectic Behavioral Therapy Critical Time Intervention Psychosocial Rehabilitation [facility]) Medication Assisted Treatment

## 2021-03-16 LAB — CERVICOVAGINAL ANCILLARY ONLY
Bacterial Vaginitis (gardnerella): NEGATIVE
Candida Glabrata: NEGATIVE
Candida Vaginitis: POSITIVE — AB
Chlamydia: NEGATIVE
Comment: NEGATIVE
Comment: NEGATIVE
Comment: NEGATIVE
Comment: NEGATIVE
Comment: NEGATIVE
Comment: NORMAL
Neisseria Gonorrhea: NEGATIVE
Trichomonas: NEGATIVE

## 2021-03-16 MED ORDER — FLUCONAZOLE 150 MG PO TABS: 150.0000 mg | ORAL_TABLET | Freq: Once | ORAL | 0 refills | Status: AC

## 2021-03-16 NOTE — Addendum Note (Signed)
Addended by: Cheral Marker on: 03/16/2021 01:04 PM   Modules accepted: Orders

## 2021-03-17 LAB — MICROALBUMIN / CREATININE URINE RATIO
Creatinine, Urine: 148.7 mg/dL
Microalb/Creat Ratio: 7 mg/g creat (ref 0–29)
Microalbumin, Urine: 10.9 ug/mL

## 2021-03-17 LAB — HEMOGLOBIN A1C
Est. average glucose Bld gHb Est-mCnc: 111 mg/dL
Hgb A1c MFr Bld: 5.5 % (ref 4.8–5.6)

## 2021-03-17 LAB — CBC WITH DIFFERENTIAL/PLATELET
Basophils Absolute: 0.1 10*3/uL (ref 0.0–0.2)
Basos: 1 %
EOS (ABSOLUTE): 0 10*3/uL (ref 0.0–0.4)
Eos: 0 %
Hematocrit: 44.9 % (ref 34.0–46.6)
Hemoglobin: 14.2 g/dL (ref 11.1–15.9)
Immature Grans (Abs): 0 10*3/uL (ref 0.0–0.1)
Immature Granulocytes: 0 %
Lymphocytes Absolute: 3.3 10*3/uL — ABNORMAL HIGH (ref 0.7–3.1)
Lymphs: 31 %
MCH: 25.4 pg — ABNORMAL LOW (ref 26.6–33.0)
MCHC: 31.6 g/dL (ref 31.5–35.7)
MCV: 80 fL (ref 79–97)
Monocytes Absolute: 0.6 10*3/uL (ref 0.1–0.9)
Monocytes: 5 %
Neutrophils Absolute: 6.9 10*3/uL (ref 1.4–7.0)
Neutrophils: 63 %
Platelets: 360 10*3/uL (ref 150–450)
RBC: 5.6 x10E6/uL — ABNORMAL HIGH (ref 3.77–5.28)
RDW: 13.4 % (ref 11.7–15.4)
WBC: 10.9 10*3/uL — ABNORMAL HIGH (ref 3.4–10.8)

## 2021-03-17 LAB — LIPID PANEL WITH LDL/HDL RATIO
Cholesterol, Total: 171 mg/dL (ref 100–199)
HDL: 44 mg/dL (ref >39)
LDL Chol Calc (NIH): 86 mg/dL (ref 0–99)
LDL/HDL Ratio: 2 ratio (ref 0.0–3.2)
Triglycerides: 244 mg/dL — ABNORMAL HIGH (ref 0–149)
VLDL Cholesterol Cal: 41 mg/dL — ABNORMAL HIGH (ref 5–40)

## 2021-03-17 LAB — CMP14+EGFR
ALT: 17 IU/L (ref 0–32)
AST: 16 IU/L (ref 0–40)
Albumin/Globulin Ratio: 2 (ref 1.2–2.2)
Albumin: 4.9 g/dL — ABNORMAL HIGH (ref 3.8–4.8)
Alkaline Phosphatase: 89 IU/L (ref 44–121)
BUN/Creatinine Ratio: 16 (ref 9–23)
BUN: 11 mg/dL (ref 6–20)
Bilirubin Total: 0.4 mg/dL (ref 0.0–1.2)
CO2: 22 mmol/L (ref 20–29)
Calcium: 9.6 mg/dL (ref 8.7–10.2)
Chloride: 100 mmol/L (ref 96–106)
Creatinine, Ser: 0.69 mg/dL (ref 0.57–1.00)
Globulin, Total: 2.5 g/dL (ref 1.5–4.5)
Glucose: 111 mg/dL — ABNORMAL HIGH (ref 70–99)
Potassium: 3.9 mmol/L (ref 3.5–5.2)
Sodium: 138 mmol/L (ref 134–144)
Total Protein: 7.4 g/dL (ref 6.0–8.5)
eGFR: 117 mL/min/{1.73_m2} (ref >59)

## 2021-03-17 LAB — TSH+FREE T4
Free T4: 1.84 ng/dL — ABNORMAL HIGH (ref 0.82–1.77)
TSH: 0.07 u[IU]/mL — ABNORMAL LOW (ref 0.450–4.500)

## 2021-04-12 ENCOUNTER — Encounter: Payer: Self-pay | Admitting: Women's Health

## 2021-04-12 ENCOUNTER — Ambulatory Visit (INDEPENDENT_AMBULATORY_CARE_PROVIDER_SITE_OTHER): Payer: 59 | Admitting: Women's Health

## 2021-04-12 ENCOUNTER — Other Ambulatory Visit: Payer: Self-pay

## 2021-04-12 VITALS — BP 139/90 | HR 83 | Ht 65.0 in | Wt 320.8 lb

## 2021-04-12 DIAGNOSIS — I10 Essential (primary) hypertension: Secondary | ICD-10-CM

## 2021-04-12 DIAGNOSIS — Z3202 Encounter for pregnancy test, result negative: Secondary | ICD-10-CM

## 2021-04-12 DIAGNOSIS — Z3041 Encounter for surveillance of contraceptive pills: Secondary | ICD-10-CM | POA: Diagnosis not present

## 2021-04-12 LAB — POCT URINE PREGNANCY: Preg Test, Ur: NEGATIVE

## 2021-04-12 NOTE — Patient Instructions (Signed)
Take lostarn (bp med) at least 1-2 hours before appointment to check blood pressure ?

## 2021-04-12 NOTE — Progress Notes (Signed)
? ?GYN VISIT ?Patient name: Madison Mcgrath MRN [id number]  Date of birth: 02/22/1990 ?Chief Complaint:   ?medication follow-up (Loestrin FE/ wants pregnancy done) ? ?History of Present Illness:   ?Madison Mcgrath is a 31 y.o. G22P0001 Caucasian female being seen today for f/u on Junel rx'd 01/11/21 at her pp visit. Doing well, states she doesn't miss/skip any pills. Last period only lasted 2 days and was barely any bleeding, which was not normal for her, wants pregnancy test.  ?Has CHTN, hasn't taken losartan this am.  ?Patient's last menstrual period was 03/29/2021. ?The current method of family planning is OCP (estrogen/progesterone).  ?Last pap 10/16/18. Results were: NILM w/ HRHPV negative ? ?Depression screen [facility] 2/9 03/15/2021 10/14/2020 07/29/2020 07/08/2020 06/18/2020  ?Decreased Interest 0 0 0 0 0  ?Down, Depressed, Hopeless 0 0 1 0 1  ?PHQ - 2 Score 0 0 1 0 1  ?Altered sleeping 0 - - - 0  ?Tired, decreased energy 3 - - - 1  ?Change in appetite 3 - - - 1  ?Feeling bad or failure about yourself  0 - - - 0  ?Trouble concentrating 2 - - - 0  ?Moving slowly or fidgety/restless 0 - - - 0  ?Suicidal thoughts 0 - - - 0  ?PHQ-9 Score 8 - - - 3  ? ?  ?GAD 7 : Generalized Anxiety Score 03/15/2021 06/18/2020  ?Nervous, Anxious, on Edge 1 0  ?Control/stop worrying 0 1  ?Worry too much - different things 2 1  ?Trouble relaxing 0 0  ?Restless 0 0  ?Easily annoyed or irritable 3 1  ?Afraid - awful might happen 2 0  ?Total GAD 7 Score 8 3  ? ? ? ?Review of Systems:   ?Pertinent items are noted in HPI ?Denies fever/chills, dizziness, headaches, visual disturbances, fatigue, shortness of breath, chest pain, abdominal pain, vomiting, abnormal vaginal discharge/itching/odor/irritation, problems with periods, bowel movements, urination, or intercourse unless otherwise stated above.  ?Pertinent History Reviewed:  ?Reviewed past medical,surgical, social, obstetrical and family history.  ?Reviewed problem list, medications and allergies. ?Physical  Assessment:  ? ?Vitals:  ? 04/12/21 1002 04/12/21 1042  ?BP: (!) 138/93 139/90  ?Pulse: 94 83  ?Weight: (!) 320 lb 12.8 oz (145.5 kg)   ?Height: 5\' 5"  (1.651 m)   ?Body mass index is 53.38 kg/m?. ? ?     Physical Examination:  ? General appearance: alert, well appearing, and in no distress ? Mental status: alert, oriented to person, place, and time ? Skin: warm & dry  ? Cardiovascular: normal heart rate noted ? Respiratory: normal respiratory effort, no distress ? Abdomen: soft, non-tender  ? Pelvic: examination not indicated ? Extremities: no edema  ? ?Chaperone: N/A   ? ?Results for orders placed or performed in visit on 04/12/21 (from the past 24 hour(s))  ?POCT urine pregnancy  ? Collection Time: 04/12/21 10:26 AM  ?Result Value Ref Range  ? Preg Test, Ur Negative Negative  ?  ?Assessment & Plan:  ?1) Contraception surveillance> f/u on Junel rx'd in Dec at pp visit. Doing well, last period shorter & lighter, neg UPT ? ?2) CHTN> did not take losartan this am, make f/u appt to return and make sure to take losartan at least 1-2hrs before appt. If bp still elevated, will need to switch to progestin-only method ? ?Meds: No orders of the defined types were placed in this encounter. ? ? ?Orders Placed This Encounter  ?Procedures  ? POCT urine pregnancy  ? ? ?  Return for this week or next for bp check w/ nurse. ? ?Cheral Marker CNM, WHNP-BC ?04/12/2021 ?10:44 AM  ?

## 2021-04-14 ENCOUNTER — Other Ambulatory Visit (HOSPITAL_COMMUNITY): Payer: Self-pay | Admitting: Nurse Practitioner

## 2021-04-14 DIAGNOSIS — M25521 Pain in right elbow: Secondary | ICD-10-CM

## 2021-04-14 DIAGNOSIS — M545 Low back pain, unspecified: Secondary | ICD-10-CM

## 2021-04-15 ENCOUNTER — Ambulatory Visit (HOSPITAL_COMMUNITY)
Admission: RE | Admit: 2021-04-15 | Discharge: 2021-04-15 | Disposition: A | Discharge status: Home / Self Care | Payer: 59 | Source: Ambulatory Visit | Attending: Nurse Practitioner | Admitting: Nurse Practitioner

## 2021-04-15 ENCOUNTER — Other Ambulatory Visit: Payer: Self-pay

## 2021-04-15 DIAGNOSIS — M25522 Pain in left elbow: Secondary | ICD-10-CM | POA: Insufficient documentation

## 2021-04-15 DIAGNOSIS — M25521 Pain in right elbow: Secondary | ICD-10-CM | POA: Diagnosis present

## 2021-04-15 DIAGNOSIS — M545 Low back pain, unspecified: Secondary | ICD-10-CM | POA: Insufficient documentation

## 2021-04-20 ENCOUNTER — Encounter: Payer: Self-pay | Admitting: Women's Health

## 2021-04-20 ENCOUNTER — Ambulatory Visit: Payer: 59

## 2021-04-21 ENCOUNTER — Other Ambulatory Visit: Payer: Self-pay | Admitting: Nurse Practitioner

## 2021-04-21 DIAGNOSIS — G43709 Chronic migraine without aura, not intractable, without status migrainosus: Secondary | ICD-10-CM

## 2021-04-26 ENCOUNTER — Other Ambulatory Visit: Payer: Self-pay | Admitting: Nurse Practitioner

## 2021-04-26 DIAGNOSIS — G43709 Chronic migraine without aura, not intractable, without status migrainosus: Secondary | ICD-10-CM

## 2021-04-26 MED ORDER — DICLOFENAC SODIUM 75 MG PO TBEC: 75.0000 mg | DELAYED_RELEASE_TABLET | Freq: Every day | ORAL | 0 refills | Status: DC | PRN

## 2021-04-27 NOTE — Telephone Encounter (Signed)
Spoke with pt she states that she has moved and is going to a different doctor now ?

## 2021-05-26 ENCOUNTER — Other Ambulatory Visit: Payer: Self-pay | Admitting: Nurse Practitioner

## 2021-05-26 DIAGNOSIS — G43709 Chronic migraine without aura, not intractable, without status migrainosus: Secondary | ICD-10-CM

## 2021-05-31 ENCOUNTER — Ambulatory Visit: Payer: 59 | Admitting: Nurse Practitioner

## 2021-05-31 ENCOUNTER — Ambulatory Visit (INDEPENDENT_AMBULATORY_CARE_PROVIDER_SITE_OTHER): Payer: 59 | Admitting: Adult Health

## 2021-05-31 ENCOUNTER — Encounter: Payer: Self-pay | Admitting: Adult Health

## 2021-05-31 VITALS — BP 142/80 | HR 89 | Ht 65.0 in | Wt 334.0 lb

## 2021-05-31 DIAGNOSIS — N926 Irregular menstruation, unspecified: Secondary | ICD-10-CM | POA: Insufficient documentation

## 2021-05-31 DIAGNOSIS — I10 Essential (primary) hypertension: Secondary | ICD-10-CM | POA: Diagnosis not present

## 2021-05-31 DIAGNOSIS — Z3201 Encounter for pregnancy test, result positive: Secondary | ICD-10-CM | POA: Diagnosis not present

## 2021-05-31 DIAGNOSIS — Z6841 Body Mass Index (BMI) 40.0 and over, adult: Secondary | ICD-10-CM

## 2021-05-31 DIAGNOSIS — Z3A01 Less than 8 weeks gestation of pregnancy: Secondary | ICD-10-CM | POA: Diagnosis not present

## 2021-05-31 LAB — POCT URINE PREGNANCY: Preg Test, Ur: POSITIVE — AB

## 2021-05-31 MED ORDER — PRENATAL PLUS 27-1 MG PO TABS: 1.0000 | ORAL_TABLET | Freq: Every day | ORAL | 12 refills | Status: DC

## 2021-05-31 NOTE — Progress Notes (Signed)
?  Subjective:  ?  ? Patient ID: AHMIAH BIDGOOD, female   DOB: [birth year], 31 y.o.   MRN: [id number] ? ?HPI ?Madison Mcgrath is a 31 year old white female,single, in for UPT, has not had a period on OCs and for BP check. ?Lab Results  ?Component Value Date  ? DIAGPAP  10/16/2018  ?  - Negative for intraepithelial lesion or malignancy (NILM)  ? HPV NOT Detected 10/16/2018  ? Cherry Negative 10/16/2018  ? PCP is Dr Nevada Crane. ? ?Review of Systems ?No periods on OCs ?Reviewed past medical,surgical, social and family history. Reviewed medications and allergies.  ?   ?Objective:  ? Physical Exam ?BP (!) 142/80 (BP Location: Right Arm, Patient Position: Sitting, Cuff Size: Normal)   Pulse 89   Ht 5\' 5"  (1.651 m)   Wt (!) 334 lb (151.5 kg)   LMP  (LMP Unknown)   BMI 55.58 kg/m?  UPT is positive. ?Skin warm and dry. Neck: mid line trachea, normal thyroid, good ROM, no lymphadenopathy noted. Lungs: clear to ausculation bilaterally. Cardiovascular: regular rate and rhythm. Abdomen is soft and non tender and obese. ? [facility] - 05/31/21 1116   ? ?  ? Pregnancy Intention Screening  ? Does the patient want to become pregnant in the next year? Yes   ? Does the patient's partner want to become pregnant in the next year? Yes   ? Would the patient like to discuss contraceptive options today? No   ?  ? Contraception Wrap Up  ? Current Method Pregnant/Seeking Pregnancy   ? End Method Pregnant/Seeking Pregnancy   ? ?  ?  ? ?  ?  ?   ?Assessment:  ?   ?1. Less than [redacted] weeks gestation of pregnancy ?Will get [place] in about 2 weeks  ?- US OB Comp Less 14 Wks; Future ? ?2. Missed periods ?Will check QHCG  ?- Beta hCG quant (ref lab) ?- POCT urine pregnancy ? ?3. Chronic hypertension ?Continue losartan for now ? ?4. Morbid obesity with BMI of 50.0-59.9, adult ([place]) ? ?5. Positive pregnancy test ?- POCT urine pregnancy  ?  Will rx prenatal vitamins ?Meds ordered this encounter  ?Medications  ? prenatal vitamin w/FE, FA (PRENATAL 1 + 1) 27-1 MG TABS tablet  ?  Sig:  Take 1 tablet by mouth daily at 12 noon.  ?  Dispense:  30 tablet  ?  Refill:  12  ?  Order Specific Question:   Supervising Provider  ?  Answer:   Madison Mcgrath [[id number]]  ? Stop OCs ? ?Plan:  ?   ?Return in about 2 weeks for dating [place], but may change depending on [facility]  ?Has pregnancy handout from last time, has 81 month old  ?   ?

## 2021-06-01 LAB — BETA HCG QUANT (REF LAB): hCG Quant: 8312 m[IU]/mL

## 2021-06-08 ENCOUNTER — Encounter: Payer: Self-pay | Admitting: Women's Health

## 2021-06-11 ENCOUNTER — Encounter: Payer: Self-pay | Admitting: Women's Health

## 2021-06-12 ENCOUNTER — Other Ambulatory Visit: Payer: Self-pay

## 2021-06-12 ENCOUNTER — Encounter (HOSPITAL_COMMUNITY): Payer: Self-pay | Admitting: Emergency Medicine

## 2021-06-12 ENCOUNTER — Emergency Department (HOSPITAL_COMMUNITY)
Admission: EM | Admit: 2021-06-12 | Discharge: 2021-06-12 | Disposition: A | Discharge status: Home / Self Care | Payer: 59 | Attending: Emergency Medicine | Admitting: Emergency Medicine

## 2021-06-12 ENCOUNTER — Emergency Department (HOSPITAL_COMMUNITY): Payer: 59

## 2021-06-12 DIAGNOSIS — Z3A00 Weeks of gestation of pregnancy not specified: Secondary | ICD-10-CM | POA: Insufficient documentation

## 2021-06-12 DIAGNOSIS — O4690 Antepartum hemorrhage, unspecified, unspecified trimester: Secondary | ICD-10-CM

## 2021-06-12 DIAGNOSIS — O0281 Inappropriate change in quantitative human chorionic gonadotropin (hCG) in early pregnancy: Secondary | ICD-10-CM | POA: Insufficient documentation

## 2021-06-12 DIAGNOSIS — O209 Hemorrhage in early pregnancy, unspecified: Secondary | ICD-10-CM | POA: Diagnosis present

## 2021-06-12 DIAGNOSIS — Z3A01 Less than 8 weeks gestation of pregnancy: Secondary | ICD-10-CM | POA: Diagnosis not present

## 2021-06-12 DIAGNOSIS — R102 Pelvic and perineal pain: Secondary | ICD-10-CM | POA: Insufficient documentation

## 2021-06-12 DIAGNOSIS — O26891 Other specified pregnancy related conditions, first trimester: Secondary | ICD-10-CM | POA: Diagnosis not present

## 2021-06-12 LAB — BASIC METABOLIC PANEL
Anion gap: 8 (ref 5–15)
BUN: 9 mg/dL (ref 6–20)
CO2: 25 mmol/L (ref 22–32)
Calcium: 9.6 mg/dL (ref 8.9–10.3)
Chloride: 103 mmol/L (ref 98–111)
Creatinine, Ser: 0.58 mg/dL (ref 0.44–1.00)
GFR, Estimated: >60 mL/min (ref >60)
Glucose, Bld: 103 mg/dL — ABNORMAL HIGH (ref 70–99)
Potassium: 3.9 mmol/L (ref 3.5–5.1)
Sodium: 136 mmol/L (ref 135–145)

## 2021-06-12 LAB — CBC WITH DIFFERENTIAL/PLATELET
Abs Immature Granulocytes: 0.03 10*3/uL (ref 0.00–0.07)
Basophils Absolute: 0.1 10*3/uL (ref 0.0–0.1)
Basophils Relative: 1 %
Eosinophils Absolute: 0 10*3/uL (ref 0.0–0.5)
Eosinophils Relative: 0 %
HCT: 41.1 % (ref 36.0–46.0)
Hemoglobin: 13 g/dL (ref 12.0–15.0)
Immature Granulocytes: 0 %
Lymphocytes Relative: 32 %
Lymphs Abs: 3.1 10*3/uL (ref 0.7–4.0)
MCH: 25.7 pg — ABNORMAL LOW (ref 26.0–34.0)
MCHC: 31.6 g/dL (ref 30.0–36.0)
MCV: 81.4 fL (ref 80.0–100.0)
Monocytes Absolute: 0.5 10*3/uL (ref 0.1–1.0)
Monocytes Relative: 5 %
Neutro Abs: 6 10*3/uL (ref 1.7–7.7)
Neutrophils Relative %: 62 %
Platelets: 301 10*3/uL (ref 150–400)
RBC: 5.05 MIL/uL (ref 3.87–5.11)
RDW: 15.7 % — ABNORMAL HIGH (ref 11.5–15.5)
WBC: 9.7 10*3/uL (ref 4.0–10.5)
nRBC: 0 % (ref 0.0–0.2)

## 2021-06-12 LAB — HCG, QUANTITATIVE, PREGNANCY: hCG, Beta Chain, Quant, S: 9603 m[IU]/mL — ABNORMAL HIGH (ref <5)

## 2021-06-12 NOTE — Discharge Instructions (Signed)
[facility].  If they gave you a hard time about not having any appointments please show them this.  This patient was seen and evaluated in the emergency department for vaginal bleeding in pregnancy.  Per OB/GYN on-call instructions the patient needs to be seen on Monday, 06/14/2021 for repeat hCG quantitative level.  Please return to the emergency department for any worsening symptoms you might have.

## 2021-06-12 NOTE — ED Notes (Signed)
Pt returned from US

## 2021-06-12 NOTE — ED Provider Notes (Signed)
[facility] EMERGENCY DEPARTMENT Provider Note   CSN: [id number] Arrival date & time: 06/12/21  1543     History Chief Complaint  Patient presents with   Vaginal Bleeding    Madison Mcgrath is a 31 y.o. female who is a G2, P1 Ab0 patient is coming in with 1 day history of vaginal bleeding.  Patient saw her OB/GYN on the eighth and had a serum quant level of 8000.  She had an ultrasound scheduled for later this month.  Patient states that vaginal bleeding has been intermittent since yesterday.  However, after she had a bowel movement today she states that her vaginal bleeding has been more constant.  She states that she is having small bowel clots as well.  Does report associated lower abdominal cramping.  No nausea, vomiting, diarrhea.  No fever or chills.   Vaginal Bleeding     Home Medications Prior to Admission medications   Medication Sig Start Date End Date Taking? Authorizing Provider  acetaminophen (TYLENOL) 325 MG tablet Take 2 tablets (650 mg total) by mouth every 4 (four) hours as needed (for pain scale < 4). Patient not taking: Reported on 05/31/2021 12/09/20   Reeves ForthFaber, Audrey, MD  famotidine (PEPCID) 20 MG tablet Take 20 mg by mouth 2 (two) times daily. Patient not taking: Reported on 05/31/2021 02/16/21   [provider]  lamoTRIgine (LAMICTAL) 150 MG tablet Take 300 mg by mouth 2 (two) times daily. Take two tablets daily 03/10/21   [provider]  levothyroxine (SYNTHROID) 25 MCG tablet Take 1 tablet (25 mcg total) by mouth every morning. 12/09/20 05/31/21  Reeves ForthFaber, Audrey, MD  losartan (COZAAR) 50 MG tablet Take 1 tablet (50 mg total) by mouth daily. 12/09/20   Reeves ForthFaber, Audrey, MD  prenatal vitamin w/FE, FA (PRENATAL 1 + 1) 27-1 MG TABS tablet Take 1 tablet by mouth daily at 12 noon. 05/31/21   Adline PotterGriffin, Jennifer A, NP      Allergies    Bee venom    Review of Systems   Review of Systems  Genitourinary:  Positive for vaginal bleeding.  All other systems reviewed  and are negative.  Physical Exam Updated Vital Signs BP 138/74   Pulse 78   Temp 98.5 F (36.9 C) (Oral)   Resp 19   Ht 5\' 5"  (1.651 m)   Wt (!) 149.7 kg   LMP  (LMP Unknown)   SpO2 100%   BMI 54.91 kg/m  Physical Exam Vitals and nursing note reviewed.  Constitutional:      General: She is not in acute distress.    Appearance: Normal appearance. She is obese.  HENT:     Head: Normocephalic and atraumatic.  Eyes:     General:        Right eye: No discharge.        Left eye: No discharge.  Cardiovascular:     Comments: Regular rate and rhythm.  S1/S2 are distinct without any evidence of murmur, rubs, or gallops.  Radial pulses are 2+ bilaterally.  Dorsalis pedis pulses are 2+ bilaterally.  No evidence of pedal edema. Pulmonary:     Comments: Clear to auscultation bilaterally.  Normal effort.  No respiratory distress.  No evidence of wheezes, rales, or rhonchi heard throughout. Abdominal:     General: Abdomen is flat. Bowel sounds are normal. There is no distension.     Tenderness: There is no abdominal tenderness. There is no guarding or rebound.  Musculoskeletal:  General: Normal range of motion.     Cervical back: Neck supple.  Skin:    General: Skin is warm and dry.     Findings: No rash.  Neurological:     General: No focal deficit present.     Mental Status: She is alert.  Psychiatric:        Mood and Affect: Mood normal.        Behavior: Behavior normal.    ED Results / Procedures / Treatments   Labs (all labs ordered are listed, but only abnormal results are displayed) Labs Reviewed  CBC WITH DIFFERENTIAL/PLATELET - Abnormal; Notable for the following components:      Result Value   MCH 25.7 (*)    RDW 15.7 (*)    All other components within normal limits  BASIC METABOLIC PANEL - Abnormal; Notable for the following components:   Glucose, Bld 103 (*)    All other components within normal limits  HCG, QUANTITATIVE, PREGNANCY - Abnormal; Notable for  the following components:   hCG, Beta Chain, Quant, S 9,603 (*)    All other components within normal limits    EKG None  Radiology US OB LESS THAN 14 WEEKS WITH OB TRANSVAGINAL  Result Date: 06/12/2021 CLINICAL DATA:  Pelvic pain and vaginal bleeding. EXAM: OBSTETRIC <14 WK [place] AND TRANSVAGINAL OB US TECHNIQUE: Both transabdominal and transvaginal ultrasound examinations were performed for complete evaluation of the gestation as well as the maternal uterus, adnexal regions, and pelvic cul-de-sac. Transvaginal technique was performed to assess early pregnancy. COMPARISON:  None Available. FINDINGS: The uterus is anteverted and appears unremarkable. There is a sac-like structure in the upper endometrium. No yolk sac or fetal pole identified within this structure. This may represent an early gestational sac or a blighted ovum. A pseudogestation of an ectopic pregnancy is not excluded. If this sac-like structure represents a true gestational sac, the estimated gestational age based on mean sac diameter of 13 mm is 6 weeks, 1 day. The ovaries are unremarkable. No significant free fluid within the pelvis. IMPRESSION: Sac-like structure within the endometrium as above. Clinical correlation and follow-up with serial HCG levels and repeat ultrasound in 7-11 days, or earlier if clinically indicated, recommended. Electronically Signed   By: Elgie Collard M.D.   On: 06/12/2021 19:38    Procedures Procedures    Medications Ordered in ED Medications - No data to display  ED Course/ Medical Decision Making/ A&P Clinical Course as of 06/12/21 2124  Sat Jun 12, 2021  2110 I spoke with Dr. Despina Hidden with OBGYN who reviewed the labs and imaging and recommended the [facility] for a repeat quat level. If pain persists the patient is to return to the ED.  [CF]  2116 CBC with Differential(!) No evidence of leukocytosis or anemia. [CF]  2116 Basic metabolic panel(!) Elevated  glucose. [CF]  2116 hCG, quantitative, pregnancy(!) Quant level is 9600.  I would expect this to be higher from 8000 more significantly. [CF]    Clinical Course User Index [CF] Teressa Lower, PA-C                           Medical Decision Making Madison Mcgrath is a 31 y.o. female who presents to the emergency department for lower abdominal pain and vaginal bleeding.  Differential diagnosis includes miscarriage, threatened abortion, ovarian cyst.   Amount and/or Complexity of Data Reviewed External Data Reviewed:  labs and notes.    Details: [facility] which showed a quantitative level of 8000. Labs: ordered. Decision-making details documented in ED Course. Radiology: ordered and independent interpretation performed.    Details: I reviewed the ultrasound images which did reveal a gestational sac with no obvious fetal heart tones.  Risk Prescription drug management. Decision regarding hospitalization. Risk Details: Patient in no acute distress.  Given the quant level and ultrasound findings I do believe that the patient should follow-up with [facility] on Monday for repeat quant level.  Notified patient and significant other at the bedside of all findings.  I educated the patient on what needs to happen.  Strict turn precautions were discussed with the patient at the bedside.  They expressed full understanding.  She is safer discharge.   Final Clinical Impression(s) / ED Diagnoses Final diagnoses:  Vaginal bleeding in pregnancy    Rx / DC Orders ED Discharge Orders     None         Jolyn Lent 06/12/21 2124    Mancel Bale, MD 06/13/21 [phone]

## 2021-06-12 NOTE — ED Triage Notes (Signed)
Patient c/o vaginal bleeding that started yesterday. Per patient seen at [facility] and indicated she was 7-8 weeks. HCG 8000. Patient states bleeding is starting to increase and has slight pain in upper abd.

## 2021-06-14 ENCOUNTER — Other Ambulatory Visit: Payer: 59

## 2021-06-15 ENCOUNTER — Telehealth: Payer: Self-pay | Admitting: Obstetrics & Gynecology

## 2021-06-15 LAB — BETA HCG QUANT (REF LAB): hCG Quant: 6623 m[IU]/mL

## 2021-06-15 NOTE — Telephone Encounter (Signed)
Called patient to review hcg and recent US.  Concern for early pregnancy vs miscarriage.  Follow up as scheduled 5/30  Myna Hidalgo, DO Attending Obstetrician & Gynecologist, [facility] for [facility], [facility]

## 2021-06-15 NOTE — Telephone Encounter (Signed)
Patient called concerning her hcg results. Please advise.

## 2021-06-16 ENCOUNTER — Emergency Department (HOSPITAL_COMMUNITY)
Admission: EM | Admit: 2021-06-16 | Discharge: 2021-06-16 | Disposition: A | Discharge status: Home / Self Care | Payer: 59 | Attending: Emergency Medicine | Admitting: Emergency Medicine

## 2021-06-16 ENCOUNTER — Encounter (HOSPITAL_COMMUNITY): Payer: Self-pay | Admitting: *Deleted

## 2021-06-16 ENCOUNTER — Other Ambulatory Visit: Payer: Self-pay

## 2021-06-16 ENCOUNTER — Emergency Department (HOSPITAL_COMMUNITY): Payer: 59

## 2021-06-16 DIAGNOSIS — O0281 Inappropriate change in quantitative human chorionic gonadotropin (hCG) in early pregnancy: Secondary | ICD-10-CM | POA: Insufficient documentation

## 2021-06-16 DIAGNOSIS — O034 Incomplete spontaneous abortion without complication: Secondary | ICD-10-CM | POA: Diagnosis present

## 2021-06-16 LAB — BASIC METABOLIC PANEL
Anion gap: 8 (ref 5–15)
BUN: 10 mg/dL (ref 6–20)
CO2: 22 mmol/L (ref 22–32)
Calcium: 8.9 mg/dL (ref 8.9–10.3)
Chloride: 107 mmol/L (ref 98–111)
Creatinine, Ser: 0.61 mg/dL (ref 0.44–1.00)
GFR, Estimated: >60 mL/min (ref >60)
Glucose, Bld: 109 mg/dL — ABNORMAL HIGH (ref 70–99)
Potassium: 3.7 mmol/L (ref 3.5–5.1)
Sodium: 137 mmol/L (ref 135–145)

## 2021-06-16 LAB — HCG, QUANTITATIVE, PREGNANCY: hCG, Beta Chain, Quant, S: 4273 m[IU]/mL — ABNORMAL HIGH (ref <5)

## 2021-06-16 LAB — HEMOGLOBIN AND HEMATOCRIT, BLOOD
HCT: 37.6 % (ref 36.0–46.0)
Hemoglobin: 11.8 g/dL — ABNORMAL LOW (ref 12.0–15.0)

## 2021-06-16 LAB — CBC WITH DIFFERENTIAL/PLATELET
Abs Immature Granulocytes: 0.04 10*3/uL (ref 0.00–0.07)
Basophils Absolute: 0 10*3/uL (ref 0.0–0.1)
Basophils Relative: 0 %
Eosinophils Absolute: 0 10*3/uL (ref 0.0–0.5)
Eosinophils Relative: 0 %
HCT: 39.4 % (ref 36.0–46.0)
Hemoglobin: 12.6 g/dL (ref 12.0–15.0)
Immature Granulocytes: 0 %
Lymphocytes Relative: 29 %
Lymphs Abs: 3.2 10*3/uL (ref 0.7–4.0)
MCH: 25.8 pg — ABNORMAL LOW (ref 26.0–34.0)
MCHC: 32 g/dL (ref 30.0–36.0)
MCV: 80.7 fL (ref 80.0–100.0)
Monocytes Absolute: 0.6 10*3/uL (ref 0.1–1.0)
Monocytes Relative: 5 %
Neutro Abs: 7.1 10*3/uL (ref 1.7–7.7)
Neutrophils Relative %: 66 %
Platelets: 291 10*3/uL (ref 150–400)
RBC: 4.88 MIL/uL (ref 3.87–5.11)
RDW: 15.4 % (ref 11.5–15.5)
WBC: 10.9 10*3/uL — ABNORMAL HIGH (ref 4.0–10.5)
nRBC: 0 % (ref 0.0–0.2)

## 2021-06-16 MED ORDER — SODIUM CHLORIDE 0.9 % IV SOLN: INTRAVENOUS | Status: DC

## 2021-06-16 NOTE — Discharge Instructions (Signed)
Return for any new or heavy bleeding.  You will be miscarrying.  But fetal sac is still present in the uterus.  Pregnancy hormone numbers have been dropping so this is not going to be a viable pregnancy.  Return for any excessive bleeding much heavier than you have had so far today.  You have remained stable so far today.

## 2021-06-16 NOTE — ED Provider Notes (Addendum)
[facility] EMERGENCY DEPARTMENT Provider Note   CSN: [id number] Arrival date & time: 06/16/21  1348     History  Chief Complaint  Patient presents with   Threatened Miscarriage    Madison Mcgrath is a 31 y.o. female.  Patient seen in the emergency department May 20 with concerns for threatened miscarriage.  [facility] followed [facility].  They saw her on May 22 repeated her quantitative hCG.  At that time it was 6623.  On May 20 it was 9603.  Patient started with vaginal bleeding on Saturday when she was seen.  It increased today with passage of clots.  Patient did not see any fetal material.  Associated with some lower abdominal cramping.  Chart review shows that she is O+ blood type from November 2022.  Patient is gravida 2 para 1 on May 8 she was told she was approximately 7 to [redacted] weeks pregnant.  Patient denies any lightheadedness or feeling that she is going to pass out.      Home Medications Prior to Admission medications   Medication Sig Start Date End Date Taking? Authorizing Provider  acetaminophen (TYLENOL) 325 MG tablet Take 2 tablets (650 mg total) by mouth every 4 (four) hours as needed (for pain scale < 4). Patient not taking: Reported on 05/31/2021 12/09/20   Reeves Forth, MD  famotidine (PEPCID) 20 MG tablet Take 20 mg by mouth 2 (two) times daily. Patient not taking: Reported on 05/31/2021 02/16/21   [provider]  lamoTRIgine (LAMICTAL) 150 MG tablet Take 300 mg by mouth 2 (two) times daily. Take two tablets daily 03/10/21   [provider]  levothyroxine (SYNTHROID) 25 MCG tablet Take 1 tablet (25 mcg total) by mouth every morning. 12/09/20 05/31/21  Reeves Forth, MD  losartan (COZAAR) 50 MG tablet Take 1 tablet (50 mg total) by mouth daily. 12/09/20   Reeves Forth, MD  prenatal vitamin w/FE, FA (PRENATAL 1 + 1) 27-1 MG TABS tablet Take 1 tablet by mouth daily at 12 noon. 05/31/21   Adline Potter, NP      Allergies    Bee venom     Review of Systems   Review of Systems  Constitutional:  Negative for chills and fever.  HENT:  Negative for ear pain and sore throat.   Eyes:  Negative for pain and visual disturbance.  Respiratory:  Negative for cough and shortness of breath.   Cardiovascular:  Negative for chest pain and palpitations.  Gastrointestinal:  Negative for abdominal pain and vomiting.  Genitourinary:  Positive for vaginal bleeding. Negative for dysuria and hematuria.  Musculoskeletal:  Negative for arthralgias and back pain.  Skin:  Negative for color change and rash.  Neurological:  Negative for seizures and syncope.  All other systems reviewed and are negative.  Physical Exam Updated Vital Signs BP 128/85   Pulse 85   Temp 98.4 F (36.9 C) (Oral)   Resp 14   Ht 1.651 m (5\' 5" )   Wt (!) 149.7 kg   LMP  (LMP Unknown)   SpO2 98%   BMI 54.91 kg/m  Physical Exam Vitals and nursing note reviewed.  Constitutional:      General: She is not in acute distress.    Appearance: She is well-developed. She is obese.  HENT:     Head: Normocephalic and atraumatic.  Eyes:     Conjunctiva/sclera: Conjunctivae normal.  Cardiovascular:     Rate and Rhythm: Normal rate and regular rhythm.  Heart sounds: No murmur heard. Pulmonary:     Effort: Pulmonary effort is normal. No respiratory distress.     Breath sounds: Normal breath sounds.  Abdominal:     General: There is no distension.     Palpations: Abdomen is soft.     Tenderness: There is no abdominal tenderness. There is no guarding.  Musculoskeletal:        General: No swelling.     Cervical back: Neck supple.  Skin:    General: Skin is warm and dry.     Capillary Refill: Capillary refill takes less than 2 seconds.  Neurological:     General: No focal deficit present.     Mental Status: She is alert and oriented to person, place, and time.  Psychiatric:        Mood and Affect: Mood normal.    ED Results / Procedures / Treatments    Labs (all labs ordered are listed, but only abnormal results are displayed) Labs Reviewed  CBC WITH DIFFERENTIAL/PLATELET - Abnormal; Notable for the following components:      Result Value   WBC 10.9 (*)    MCH 25.8 (*)    All other components within normal limits  BASIC METABOLIC PANEL - Abnormal; Notable for the following components:   Glucose, Bld 109 (*)    All other components within normal limits  HCG, QUANTITATIVE, PREGNANCY - Abnormal; Notable for the following components:   hCG, Beta Chain, Quant, S 4,273 (*)    All other components within normal limits    EKG None  Radiology No results found.  Procedures Procedures    Medications Ordered in ED Medications  0.9 %  sodium chloride infusion ( Intravenous New Bag/Given 06/16/21 1631)    ED Course/ Medical Decision Making/ A&P                           Medical Decision Making Amount and/or Complexity of Data Reviewed Labs: ordered. Radiology: ordered.  Risk Prescription drug management.   Patient's quantitative hCG here is in the 4000 range that is a significant drop from the 9000 on May 20.  Hemoglobin here reassuring 14.2.  Heart rate in the upper 90s but no hypotension.  Patient had some pictures of what she was passing at home.  Had some here.  Mostly clot.  Bleeding does not seem to be excessive but certainly not just spotting.  This should be an inevitable miscarriage.  Will discuss with on-call OB/GYN.  Gust with on-call OB/GYN.  They want to go ahead and get a repeat ultrasound to see if everything passed.  We will call the ultrasound techs then.  This is at his request.  He would then wants me to call him back once we have those results.  Discussed with Dr. Para March on-call for OB/GYN.  Ultrasound is coming in to do the ultrasound.  I will order a follow-up H&H.  Repeat hemoglobin 11.8 a little less than was at 12.6.  But not significant.  Repeat ultrasound shows that the fetal sac is still  present in the uterus but it has moved down some.  Reviewed by Dr. Vergie Living now on-call for OB/GYN patient stable for discharge home.  Vital signs are still stable.  [facility] will [facility] to [facility] her tomorrow.  Patient will return for any heavy bleeding.   Final Clinical Impression(s) / ED Diagnoses Final diagnoses:  Inevitable spontaneous abortion    Rx /  DC Orders ED Discharge Orders     None         Vanetta MuldersZackowski, Sherisa Gilvin, MD 06/16/21 Margaree [id number]    Abbie Berling, MD 06/16/21 Jolene [id number]    Ikea Demicco, MD 06/16/21 [phone]

## 2021-06-16 NOTE — ED Notes (Addendum)
Pt called out asking for help due to pain and passing a large clot of blood. RN and NT went to assist pt and found a large, fist sized clot coming from vaginal area. Pt cleansed and pad placed under pt. As soon as pt was cleansed, another clot, smaller in size, was passed. Pt cleansed and placed in brief.

## 2021-06-16 NOTE — ED Triage Notes (Signed)
Pt c/o excessive vaginal bleeding with clots that worsened today. Pt was here on Saturday with vaginal bleeding and told she was possibly having a miscarriage. She went for her HCG levels to be drawn at [place] on Monday and her levels had dropped per pt. Today, she reports the bleeding became excessive. Large clot of blood seen on the stretcher upon transfer from the EMS to ED stretcher. Pt also c/o lower abdominal cramping and generalized weakness.

## 2021-06-16 NOTE — ED Notes (Signed)
Pt continues to pass large sized clots. Pt given washcloths to cleanse and new bed linen placed. MD made aware.

## 2021-06-16 NOTE — ED Notes (Signed)
Dc instructions reviewed with pt no questions or concerns at this time. Will follow up with obgyn. 

## 2021-06-16 NOTE — ED Notes (Signed)
Patient ambulated to the beside commode. Patient stated she passed a clot and the bleeding has completely stopped.

## 2021-06-22 ENCOUNTER — Other Ambulatory Visit: Payer: 59

## 2021-06-23 ENCOUNTER — Other Ambulatory Visit: Payer: Self-pay

## 2021-06-24 ENCOUNTER — Encounter: Payer: Self-pay | Admitting: Obstetrics & Gynecology

## 2021-06-24 ENCOUNTER — Ambulatory Visit (INDEPENDENT_AMBULATORY_CARE_PROVIDER_SITE_OTHER): Payer: 59 | Admitting: Obstetrics & Gynecology

## 2021-06-24 VITALS — BP 136/85 | HR 78 | Ht 65.0 in | Wt 334.6 lb

## 2021-06-24 DIAGNOSIS — O034 Incomplete spontaneous abortion without complication: Secondary | ICD-10-CM

## 2021-06-24 MED ORDER — KETOROLAC TROMETHAMINE 10 MG PO TABS: 10.0000 mg | ORAL_TABLET | Freq: Three times a day (TID) | ORAL | 0 refills | Status: DC | PRN

## 2021-06-24 MED ORDER — MISOPROSTOL 200 MCG PO TABS: ORAL_TABLET | ORAL | 0 refills | Status: DC

## 2021-06-24 NOTE — Progress Notes (Signed)
Follow up appointment for results: Miscarriage follow up  Chief Complaint  Patient presents with   Follow-up    Blood pressure 136/85, pulse 78, height 5\' 5"  (1.651 m), weight (!) 334 lb 9.6 oz (151.8 kg), not currently breastfeeding.  HCG levels have dropped from highs  Pt denies any heavy bleeding since her ED visit, has not passed any tissue that she is aware of  DG Thoracic Spine W/Swimmers  Result Date: 04/18/2021 CLINICAL DATA:  Back pain. EXAM: THORACIC SPINE - 3 VIEWS COMPARISON:  None. FINDINGS: There is no evidence of thoracic spine fracture. There is a slight levoscoliosis and otherwise there is normal alignment. No other significant bone abnormalities are identified. There is mild thoracic spondylosis. IMPRESSION: Spondylosis and slight levoscoliosis. No evidence of fractures or listhesis. Electronically Signed   By: Almira BarKeith  Chesser M.D.   On: 04/18/2021 03:01   DG Lumbar Spine 2-3 Views  Result Date: 04/18/2021 CLINICAL DATA:  Low back pain radiating into lower extremities. EXAM: LUMBAR SPINE - 2-3 VIEW COMPARISON:  None. FINDINGS: There is no evidence of lumbar spine fracture. Alignment is normal. Intervertebral disc spaces are maintained. IMPRESSION: Negative. Electronically Signed   By: Almira BarKeith  Chesser M.D.   On: 04/18/2021 02:58   DG ELBOW COMPLETE LEFT (3+VIEW)  Result Date: 04/18/2021 CLINICAL DATA:  Elbow pain.  No recent trauma. EXAM: LEFT ELBOW - COMPLETE 3+ VIEW COMPARISON:  None. FINDINGS: There is no evidence of fracture, dislocation, or joint effusion. There is no evidence of arthropathy or other focal bone abnormality. Soft tissues are unremarkable. IMPRESSION: Negative. Electronically Signed   By: Almira BarKeith  Chesser M.D.   On: 04/18/2021 02:53   DG ELBOW COMPLETE RIGHT (3+VIEW)  Result Date: 04/18/2021 CLINICAL DATA:  Elbow pain. EXAM: RIGHT ELBOW - COMPLETE 3+ VIEW COMPARISON:  None. FINDINGS: There is no evidence of fracture, dislocation, or joint effusion. There  is no evidence of arthropathy or other focal bone abnormality. There is a tiny calcification in the soft tissues just inferior to the medial humeral epicondyle. Soft tissues are otherwise unremarkable. IMPRESSION: 1. No evidence of fractures or joint effusion. 2. Tiny calcification in the soft tissues just inferior to the medial humeral epicondyle, which could be dystrophic and chronic or could be due to calcific tendinopathy. Electronically Signed   By: Almira BarKeith  Chesser M.D.   On: 04/18/2021 02:57   US OB LESS THAN 14 WEEKS WITH OB TRANSVAGINAL  Result Date: 06/16/2021 CLINICAL DATA:  Vaginal bleeding EXAM: OBSTETRIC <14 WK US AND TRANSVAGINAL OB US TECHNIQUE: Both transabdominal and transvaginal ultrasound examinations were performed for complete evaluation of the gestation as well as the maternal uterus, adnexal regions, and pelvic cul-de-sac. Transvaginal technique was performed to assess early pregnancy. COMPARISON:  06/22/2021 FINDINGS: Intrauterine gestational sac: Irregular saccular fluid collection now visualized within the lower uterine segment. Yolk sac:  Not visualized Embryo:  Not visualized Cardiac Activity: Not visualized MSD: 12 mm   6 w   0 d Subchorionic hemorrhage:  None visualized. Maternal uterus/adnexae: Left ovary within normal limits. Corpus luteum in the right ovary. IMPRESSION: 1. Irregular saccular fluid collection within the uterus possibly representing a gestational sac, but this is visualized in the lower uterine segment and could represent miscarriage in progress. 2. Negative for adnexal mass or other abnormality. Electronically Signed   By: Jasmine PangKim  Fujinaga M.D.   On: 06/16/2021 21:08   US OB LESS THAN 14 WEEKS WITH OB TRANSVAGINAL  Result Date: 06/12/2021 CLINICAL DATA:  Pelvic pain and vaginal  bleeding. EXAM: OBSTETRIC <14 WK [place] AND TRANSVAGINAL OB US TECHNIQUE: Both transabdominal and transvaginal ultrasound examinations were performed for complete evaluation of the gestation as  well as the maternal uterus, adnexal regions, and pelvic cul-de-sac. Transvaginal technique was performed to assess early pregnancy. COMPARISON:  None Available. FINDINGS: The uterus is anteverted and appears unremarkable. There is a sac-like structure in the upper endometrium. No yolk sac or fetal pole identified within this structure. This may represent an early gestational sac or a blighted ovum. A pseudogestation of an ectopic pregnancy is not excluded. If this sac-like structure represents a true gestational sac, the estimated gestational age based on mean sac diameter of 13 mm is 6 weeks, 1 day. The ovaries are unremarkable. No significant free fluid within the pelvis. IMPRESSION: Sac-like structure within the endometrium as above. Clinical correlation and follow-up with serial HCG levels and repeat ultrasound in 7-11 days, or earlier if clinically indicated, recommended. Electronically Signed   By: Elgie Collard M.D.   On: 06/12/2021 19:38     MEDS ordered this encounter: Meds ordered this encounter  Medications   misoprostol (CYTOTEC) 200 MCG tablet    Sig: Take 4 tablets at once today.  Repeat in 24 hours if no results. Then 400 mcg three times a day for 3 day    Dispense:  26 tablet    Refill:  0   ketorolac (TORADOL) 10 MG tablet    Sig: Take 1 tablet (10 mg total) by mouth every 8 (eight) hours as needed.    Dispense:  15 tablet    Refill:  0    Orders for this encounter: No orders of the defined types were placed in this encounter.   Impression + Management Plan   ICD-10-CM   1. Incomplete abortion  O03.4    800 cytotec today or when pt chooses, then repeat if needed, then 400 mcg TID x 3 days      Follow Up: Return in about 2 weeks (around 07/08/2021) for Follow up, with Dr Despina Hidden.     All questions were answered.  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   Thyroid disease    Vaginal Pap smear, abnormal     Past Surgical History:  Procedure Laterality Date   CHOLECYSTECTOMY     LEG SURGERY Left    stick removed from leg   NO PAST SURGERIES      OB History     Gravida  2   Para  0   Term  0   Preterm  0   AB  1   Living  1      SAB  1   IAB  0   Ectopic  0   Multiple  0   Live Births  1           Allergies  Allergen Reactions   Bee Venom Rash    Social History   Socioeconomic History   Marital status: Single    Spouse name: Not on file   Number of children: 1   Years of education: Not on file   Highest education level: Not on file  Occupational History   Not on file  Tobacco Use   Smoking status: Never   Smokeless tobacco: Never  Vaping Use   Vaping Use: Former  Substance and Sexual Activity   Alcohol use: Not Currently   Drug use: Never   Sexual activity: Not Currently    Birth control/protection: None  Other Topics Concern   Not on file  Social History Narrative   Lives with her boyfriend and daughter.    Social Determinants of Health   Financial Resource Strain: Low Risk    Difficulty of Paying Living Expenses: Not hard at all  Food Insecurity: No Food Insecurity   Worried About Programme researcher, broadcasting/film/video in the Last Year: Never true   Barista in the Last Year: Never true  Transportation Needs: Unmet Transportation Needs   Lack of Transportation (Medical): No   Lack of Transportation (Non-Medical): Yes  Physical Activity: Inactive   Days of Exercise per Week: 0 days   Minutes of Exercise per Session: 0 min  Stress: Stress Concern Present   Feeling of Stress : Rather much  Social Connections: Socially Isolated   Frequency of Communication with Friends and Family: Three times a week   Frequency of Social Gatherings with Friends and Family: Twice a week   Attends Religious Services:  Never   Database administrator or Organizations: No   Attends Engineer, structural: Never   Marital Status: Never married    Family History  Problem Relation Age of Onset   Colon cancer Paternal Grandmother    Kidney failure Paternal Grandmother    Cancer Maternal Grandmother    Ovarian cancer Maternal Grandmother    Diabetes Maternal Grandfather    Arthritis Father    High blood pressure Mother    Osteoporosis Mother    Dementia Mother    Diabetes Maternal Uncle    Cleft lip Other    Autism Other    Bipolar disorder Other    Breast cancer Neg Hx

## 2021-06-28 ENCOUNTER — Other Ambulatory Visit: Payer: Self-pay | Admitting: Nurse Practitioner

## 2021-06-28 DIAGNOSIS — G43709 Chronic migraine without aura, not intractable, without status migrainosus: Secondary | ICD-10-CM

## 2021-07-09 ENCOUNTER — Ambulatory Visit: Payer: 59 | Admitting: Obstetrics & Gynecology

## 2021-07-20 ENCOUNTER — Other Ambulatory Visit (HOSPITAL_COMMUNITY): Payer: Self-pay

## 2021-07-20 ENCOUNTER — Ambulatory Visit: Payer: 59 | Admitting: Obstetrics & Gynecology

## 2021-07-20 ENCOUNTER — Encounter: Payer: Self-pay | Admitting: Obstetrics & Gynecology

## 2021-08-02 ENCOUNTER — Ambulatory Visit: Payer: 59 | Admitting: Obstetrics & Gynecology

## 2021-08-06 ENCOUNTER — Ambulatory Visit: Payer: 59 | Admitting: Obstetrics & Gynecology

## 2021-08-10 ENCOUNTER — Ambulatory Visit (INDEPENDENT_AMBULATORY_CARE_PROVIDER_SITE_OTHER): Payer: 59 | Admitting: Orthopaedic Surgery

## 2021-08-10 ENCOUNTER — Encounter: Payer: Self-pay | Admitting: Orthopaedic Surgery

## 2021-08-10 VITALS — BP 159/97 | HR 81 | Ht 65.0 in | Wt 331.0 lb

## 2021-08-10 DIAGNOSIS — Z6841 Body Mass Index (BMI) 40.0 and over, adult: Secondary | ICD-10-CM

## 2021-08-10 DIAGNOSIS — M545 Low back pain, unspecified: Secondary | ICD-10-CM

## 2021-08-10 MED ORDER — NAPROXEN 500 MG PO TABS: 500.0000 mg | ORAL_TABLET | Freq: Two times a day (BID) | ORAL | 5 refills | Status: DC

## 2021-08-10 NOTE — Patient Instructions (Signed)
You have been referred to [facility] for your lower back pain.   Stop by and schedule your appt with them. They are across from [facility] [place].   [phone] is their phone number.

## 2021-08-10 NOTE — Progress Notes (Signed)
Subjective:    Patient ID: Madison Mcgrath, female    DOB: 10/09/1990, 31 y.o.   MRN: [id number]  HPI She has had lower back pain since giving birth to a little girl in November 2022.  She said she had epidural and her back has been hurting since then.  She had X-rays of the lumbar spine in March which were negative.  She has been seen by Dr. Dwana Melena and has anti-spasm medicine which helps.  She has not been on NSAIDs.  She is tired of hurting.  She has no bowel or bladder problems, no weakness.  She has localized pain to the lower back.   Review of Systems  Constitutional:  Positive for activity change.  Musculoskeletal:  Positive for arthralgias and back pain.  All other systems reviewed and are negative. For Review of Systems, all other systems reviewed and are negative.  The following is a summary of the past history medically, past history surgically, known current medicines, social history and family history.  This information is gathered electronically by the computer from prior information and documentation.  I review this each visit and have found including this information at this point in the chart is beneficial and informative.   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 30 tablet 0   famotidine (PEPCID) 20 MG tablet Take 20 mg by mouth 2 (two) times daily.      lamoTRIgine (LAMICTAL) 150 MG tablet Take 300 mg by mouth daily. Take two tablets daily     levothyroxine (SYNTHROID) 25 MCG tablet Take 1 tablet (25 mcg total) by mouth every morning. 90 tablet 0   losartan (COZAAR) 50 MG tablet Take 1 tablet (50 mg total) by mouth daily. 90 tablet 0   BLISOVI FE 1/20 1-20 MG-MCG tablet Take 1 tablet by mouth daily.     methocarbamol (ROBAXIN) 750 MG tablet Take 750 mg by mouth 3 (three) times daily.     misoprostol (CYTOTEC) 200 MCG tablet Take 4 tablets at once today.  Repeat in 24 hours if no results. Then 400 mcg three times a day for 3 day (Patient not taking: Reported on 08/10/2021) 26 tablet 0   No current facility-administered medications on file prior to visit.    Social History   Socioeconomic History   Marital status: Single    Spouse name: Not on file   Number of children: 1   Years of education: Not on file   Highest education level: Not on file  Occupational History   Not on file  Tobacco Use   Smoking status: Never   Smokeless tobacco: Never  Vaping Use   Vaping Use: Former  Substance and Sexual Activity   Alcohol use: Not Currently   Drug use: Never  Sexual activity: Not Currently    Birth control/protection: None  Other Topics Concern   Not on file  Social History Narrative   Lives with her boyfriend and daughter.    Social Determinants of Health   Financial Resource Strain: Low Risk  (01/11/2021)   Overall Financial Resource Strain (CARDIA)    Difficulty of Paying Living Expenses: Not hard at all  Food Insecurity: No Food Insecurity (01/11/2021)   Hunger Vital Sign    Worried About Running Out of Food in the Last Year: Never true    Ran Out of Food in the Last Year: Never true  Transportation Needs: Unmet Transportation Needs (01/11/2021)   PRAPARE - Transportation    Lack of Transportation (Medical): No    Lack of Transportation (Non-Medical): Yes  Physical Activity: Inactive (01/11/2021)   Exercise Vital Sign     Days of Exercise per Week: 0 days    Minutes of Exercise per Session: 0 min  Stress: Stress Concern Present (01/11/2021)   [employer] of Occupational Health - Occupational Stress Questionnaire    Feeling of Stress : Rather much  Social Connections: Socially Isolated (01/11/2021)   Social Connection and Isolation Panel [NHANES]    Frequency of Communication with Friends and Family: Three times a week    Frequency of Social Gatherings with Friends and Family: Twice a week    Attends Religious Services: Never    Database administrator or Organizations: No    Attends Banker Meetings: Never    Marital Status: Never married  Intimate Partner Violence: Not At Risk (01/11/2021)   Humiliation, Afraid, Rape, and Kick questionnaire    Fear of Current or Ex-Partner: No    Emotionally Abused: No    Physically Abused: No    Sexually Abused: No    Family History  Problem Relation Age of Onset   Colon cancer Paternal Grandmother    Kidney failure Paternal Grandmother    Cancer Maternal Grandmother    Ovarian cancer Maternal Grandmother    Diabetes Maternal Grandfather    Arthritis Father    High blood pressure Mother    Osteoporosis Mother    Dementia Mother    Diabetes Maternal Uncle    Cleft lip Other    Autism Other    Bipolar disorder Other    Breast cancer Neg Hx     BP (!) 159/97   Pulse 81   Ht 5\' 5"  (1.651 m)   Wt (!) 331 lb (150.1 kg)   BMI 55.08 kg/m   Body mass index is 55.08 kg/m.      Objective:   Physical Exam Vitals and nursing note reviewed. Exam conducted with a chaperone present.  Constitutional:      Appearance: She is well-developed.  HENT:     Head: Normocephalic and atraumatic.  Eyes:     Conjunctiva/sclera: Conjunctivae normal.     Pupils: Pupils are equal, round, and reactive to light.  Cardiovascular:     Rate and Rhythm: Normal rate and regular rhythm.  Pulmonary:     Effort: Pulmonary effort is normal.  Abdominal:      Palpations: Abdomen is soft.  Musculoskeletal:       Arms:     Cervical back: Normal range of motion and neck supple.  Skin:    General: Skin is warm and dry.  Neurological:     Mental Status: She is alert and oriented to person, place, and time.     Cranial Nerves:  No cranial nerve deficit.     Motor: No abnormal muscle tone.     Coordination: Coordination normal.     Deep Tendon Reflexes: Reflexes are normal and symmetric. Reflexes normal.  Psychiatric:        Behavior: Behavior normal.        Thought Content: Thought content normal.        Judgment: Judgment normal.   I have independently reviewed and interpreted x-rays of this patient done at another site by another physician or qualified health professional.         Assessment & Plan:   Encounter Diagnoses  Name Primary?   Lumbar pain Yes   Body mass index 50.0-59.9, adult (HCC)    Morbid obesity (HCC)    I will begin PT.  I will begin Naprosyn 500 po bid  Continue other medicine.  She may need MRI.  Return in three weeks.  Call if any problem.  Precautions discussed.  Electronically Signed Darreld Mclean, MD 7/18/202311:23 AM

## 2021-08-22 ENCOUNTER — Encounter (HOSPITAL_COMMUNITY): Payer: Self-pay

## 2021-08-22 ENCOUNTER — Other Ambulatory Visit: Payer: Self-pay

## 2021-08-22 ENCOUNTER — Emergency Department (HOSPITAL_COMMUNITY)
Admission: EM | Admit: 2021-08-22 | Discharge: 2021-08-22 | Disposition: A | Discharge status: Home / Self Care | Payer: 59 | Attending: Emergency Medicine | Admitting: Emergency Medicine

## 2021-08-22 DIAGNOSIS — K047 Periapical abscess without sinus: Secondary | ICD-10-CM | POA: Diagnosis not present

## 2021-08-22 DIAGNOSIS — I10 Essential (primary) hypertension: Secondary | ICD-10-CM | POA: Insufficient documentation

## 2021-08-22 DIAGNOSIS — K0889 Other specified disorders of teeth and supporting structures: Secondary | ICD-10-CM

## 2021-08-22 DIAGNOSIS — Z79899 Other long term (current) drug therapy: Secondary | ICD-10-CM | POA: Diagnosis not present

## 2021-08-22 DIAGNOSIS — E039 Hypothyroidism, unspecified: Secondary | ICD-10-CM | POA: Diagnosis not present

## 2021-08-22 DIAGNOSIS — K029 Dental caries, unspecified: Secondary | ICD-10-CM | POA: Diagnosis not present

## 2021-08-22 MED ORDER — AMOXICILLIN-POT CLAVULANATE 875-125 MG PO TABS: 1.0000 | ORAL_TABLET | Freq: Two times a day (BID) | ORAL | 0 refills | Status: AC

## 2021-08-22 NOTE — ED Provider Notes (Signed)
[facility] EMERGENCY DEPARTMENT Provider Note   CSN: [id number] Arrival date & time: 08/22/21  2032     History  Chief Complaint  Patient presents with   Dental Pain    Madison Mcgrath is a 31 y.o. female with history of ADHD, bipolar disorder, hypothyroidism, chronic HTN, chronic migraines.  Presenting today with 1 week of dental pain.  States that tooth on the right lower side of her jaw has been causing intermittent pain over the last few weeks, and then over the last week has become more frequent.  Denies fevers, chills, tongue swelling, neck swelling, or discharge.  Patient still able to open and close her mouth and swallow without difficulty.  The history is provided by the patient and medical records.      Home Medications Prior to Admission medications   Medication Sig Start Date End Date Taking? Authorizing Provider  amoxicillin-clavulanate (AUGMENTIN) 875-125 MG tablet Take 1 tablet by mouth every 12 (twelve) hours for 10 days. 08/22/21 09/01/21 Yes Cecil Cobbs, PA-C  acetaminophen (TYLENOL) 325 MG tablet Take 2 tablets (650 mg total) by mouth every 4 (four) hours as needed (for pain scale < 4). 12/09/20   Reeves Forth, MD  BLISOVI FE 1/20 1-20 MG-MCG tablet Take 1 tablet by mouth daily. 06/30/21   [provider]  famotidine (PEPCID) 20 MG tablet Take 20 mg by mouth 2 (two) times daily. 02/16/21   [provider]  lamoTRIgine (LAMICTAL) 150 MG tablet Take 300 mg by mouth daily. Take two tablets daily 03/10/21   [provider]  levothyroxine (SYNTHROID) 25 MCG tablet Take 1 tablet (25 mcg total) by mouth every morning. 12/09/20 08/10/21  Reeves Forth, MD  losartan (COZAAR) 50 MG tablet Take 1 tablet (50 mg total) by mouth daily. 12/09/20   Reeves Forth, MD  methocarbamol (ROBAXIN) 750 MG tablet Take 750 mg by mouth 3 (three) times daily. 07/16/21   [provider]  misoprostol (CYTOTEC) 200 MCG tablet Take 4 tablets at once today.  Repeat  in 24 hours if no results. Then 400 mcg three times a day for 3 day Patient not taking: Reported on 08/10/2021 06/24/21   Lazaro Arms, MD  naproxen (NAPROSYN) 500 MG tablet Take 1 tablet (500 mg total) by mouth 2 (two) times daily with a meal. 08/10/21   Darreld Mclean, MD      Allergies    Bee venom    Review of Systems   Review of Systems  HENT:  Positive for dental problem.     Physical Exam Updated Vital Signs BP (!) 141/95   Pulse 89   Temp 99.3 F (37.4 C) (Oral)   Resp 20   Ht 5\' 5"  (1.651 m)   Wt (!) 145.2 kg   SpO2 100%   BMI 53.25 kg/m  Physical Exam Vitals and nursing note reviewed.  Constitutional:      General: She is not in acute distress.    Appearance: She is well-developed. She is not ill-appearing, toxic-appearing or diaphoretic.  HENT:     Head: Normocephalic and atraumatic.     Jaw: No trismus or tenderness.     Mouth/Throat:     Lips: Pink.     Mouth: Mucous membranes are moist. No oral lesions or angioedema.     Dentition: Abnormal dentition. Dental caries present. No gingival swelling, dental abscesses or gum lesions.     Tongue: No lesions. Tongue does not deviate from midline.  Palate: No mass and lesions.     Pharynx: Oropharynx is clear. Uvula midline. No oropharyngeal exudate, posterior oropharyngeal erythema or uvula swelling.     Tonsils: No tonsillar exudate or tonsillar abscesses.      Comments: Tenderness as indicated above, overall poor dentition.  No evidence of abscess.  Tongue is not elevated or swollen.  Airway patent.  Able to swallow without difficulty. Eyes:     General: No scleral icterus.    Conjunctiva/sclera: Conjunctivae normal.  Neck:     Comments: Neck very supple on exam Cardiovascular:     Rate and Rhythm: Normal rate and regular rhythm.     Heart sounds: Normal heart sounds. No murmur heard. Pulmonary:     Effort: Pulmonary effort is normal. No respiratory distress.     Breath sounds: Normal breath sounds.   Abdominal:     Palpations: Abdomen is soft.     Tenderness: There is no abdominal tenderness.  Musculoskeletal:        General: No swelling.     Cervical back: Full passive range of motion without pain and neck supple. No edema, erythema, rigidity, torticollis or crepitus.  Skin:    General: Skin is warm and dry.     Capillary Refill: Capillary refill takes less than 2 seconds.  Neurological:     Mental Status: She is alert and oriented to person, place, and time.  Psychiatric:        Mood and Affect: Mood normal.     ED Results / Procedures / Treatments   Labs (all labs ordered are listed, but only abnormal results are displayed) Labs Reviewed - No data to display  EKG None  Radiology No results found.  Procedures Procedures    Medications Ordered in ED Medications - No data to display  ED Course/ Medical Decision Making/ A&P                           Medical Decision Making  31 y.o. female presents to the ED for concern of Dental Pain   This involves an extensive number of treatment options, and is a complaint that carries with it a high risk of complications and morbidity.  The emergent differential diagnosis prior to evaluation includes, but is not limited to: Dental infection, dental abscess, Ludwig's angina  This is not an exhaustive differential.   Past Medical History / Co-morbidities / Social History: Hx of ADHD, bipolar disorder, hypothyroidism, chronic HTN, chronic migraines Social Determinants of Health include: None  Additional History:  Internal and external records from outside source obtained and reviewed including: None  Lab Tests: None  Imaging Studies: None  ED Course: Pt well-appearing on exam.  Patient presenting with toothache, appears to be tooth #28 or 27.  Nonseptic, non-ill appearing, in NAD.  Afebrile.  Poor dentition overall.  No visible purulent discharge or gross abscess.  Tongue not swollen or elevated.  Neck without swelling  or significant tenderness, is very supple on exam.  Mildly reduced TMJ ROM due to elicited pain.  Mild tenderness of affected area.  Exam unconcerning for Ludwig's angina or spread of infection.  Airway intact.  Pain managed in ED.  Plan to treat with Augmentin and anti-inflammatories.  Urged patient to follow-up closely with dentist.  Pt satisfied with today's encounter.  Patient in NAD and in good condition at time of discharge.  Disposition: After consideration the patient's encounter today, I do not feel today's workup  suggests an emergent condition requiring admission or immediate intervention beyond what has been performed at this time.  Safe for discharge; instructed to return immediately for worsening symptoms, change in symptoms or any other concerns.  I have reviewed the patients home medicines and have made adjustments as needed.  Discussed course of treatment with the patient, whom demonstrated understanding.  Patient in agreement and has no further questions.     This chart was dictated using voice recognition software.  Despite best efforts to proofread, errors can occur which can change the documentation meaning.         Final Clinical Impression(s) / ED Diagnoses Final diagnoses:  Pain, dental  Dental caries  Dental infection    Rx / DC Orders ED Discharge Orders          Ordered    amoxicillin-clavulanate (AUGMENTIN) 875-125 MG tablet  Every 12 hours        08/22/21 2303              Cecil Cobbs, PA-C 08/22/21 4825    Bethann Berkshire, MD 08/24/21 1158

## 2021-08-22 NOTE — Discharge Instructions (Addendum)
Antibiotic has been sent to the pharmacy by the name of Augmentin.  Please take 1 tablet every 12 hours for the next 10 days.  Always take with plenty of food and water.  You may also take a probiotic simultaneously to reduce the risk of antibiotic associated yeast infection.  I have provided a list of dental resources for you.  I encourage you to follow-up sooner than the planned October for reevaluation and continued management.  Continue to manage pain and inflammation with heat therapy, anti-inflammatories, and Tylenol.  The maximum dose of ibuprofen is 3200 mg/day (800 mg every 6 hours).  The maximum dose of Tylenol is 4000 mg/day (1000 mg every 6 hours).  Please return to the ED for new or worsening symptoms as discussed.

## 2021-08-22 NOTE — ED Triage Notes (Signed)
Pt presents with right sides dental pain. Pt has a consultation for removal of tooth in December. Pt states that she just can not take the same.

## 2021-08-27 ENCOUNTER — Ambulatory Visit (INDEPENDENT_AMBULATORY_CARE_PROVIDER_SITE_OTHER): Payer: 59 | Admitting: Obstetrics & Gynecology

## 2021-08-27 ENCOUNTER — Encounter: Payer: Self-pay | Admitting: Obstetrics & Gynecology

## 2021-08-27 VITALS — BP 134/86 | HR 85 | Ht 65.0 in | Wt 334.6 lb

## 2021-08-27 DIAGNOSIS — Z3202 Encounter for pregnancy test, result negative: Secondary | ICD-10-CM | POA: Diagnosis not present

## 2021-08-27 DIAGNOSIS — O039 Complete or unspecified spontaneous abortion without complication: Secondary | ICD-10-CM | POA: Diagnosis not present

## 2021-08-27 LAB — POCT URINE PREGNANCY: Preg Test, Ur: NEGATIVE
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
## 2021-08-31 ENCOUNTER — Ambulatory Visit: Payer: 59 | Admitting: Orthopaedic Surgery

## 2021-09-07 ENCOUNTER — Ambulatory Visit (HOSPITAL_COMMUNITY): Payer: 59

## 2021-09-09 ENCOUNTER — Ambulatory Visit: Payer: 59 | Admitting: Orthopaedic Surgery

## 2021-09-16 ENCOUNTER — Emergency Department (HOSPITAL_COMMUNITY): Payer: 59

## 2021-09-16 ENCOUNTER — Emergency Department (HOSPITAL_COMMUNITY)
Admission: EM | Admit: 2021-09-16 | Discharge: 2021-09-16 | Disposition: A | Discharge status: Home / Self Care | Payer: 59 | Attending: Emergency Medicine | Admitting: Emergency Medicine

## 2021-09-16 ENCOUNTER — Encounter (HOSPITAL_COMMUNITY): Payer: Self-pay | Admitting: Emergency Medicine

## 2021-09-16 DIAGNOSIS — I10 Essential (primary) hypertension: Secondary | ICD-10-CM | POA: Insufficient documentation

## 2021-09-16 DIAGNOSIS — M19022 Primary osteoarthritis, left elbow: Secondary | ICD-10-CM | POA: Insufficient documentation

## 2021-09-16 DIAGNOSIS — S0003XA Contusion of scalp, initial encounter: Secondary | ICD-10-CM | POA: Diagnosis not present

## 2021-09-16 DIAGNOSIS — M25522 Pain in left elbow: Secondary | ICD-10-CM | POA: Insufficient documentation

## 2021-09-16 MED ORDER — ACETAMINOPHEN 500 MG PO TABS
1000.0000 mg | ORAL_TABLET | Freq: Once | ORAL | Status: AC
Start: 2021-09-16 — End: 2021-09-16
  Administered 2021-09-16: 1000 mg via ORAL
  Filled 2021-09-16: qty 2

## 2021-09-16 NOTE — Discharge Instructions (Addendum)
You were evaluated in the Emergency Department and after careful evaluation, we did not find any emergent condition requiring admission or further testing in the hospital.  Your exam/testing today was overall reassuring.  CTs/x-rays did not show any significant injuries or broken bones.  Pain likely due to bruising or sprain/strain.  Recommend Tylenol or Motrin for discomfort.  Please return to the Emergency Department if you experience any worsening of your condition.  Thank you for allowing [place] to be a part of your care.
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
## 2021-09-16 NOTE — ED Notes (Signed)
Went over dc papers. All questions answered. 
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
## 2021-09-30 ENCOUNTER — Ambulatory Visit (HOSPITAL_COMMUNITY): Payer: 59 | Attending: Orthopaedic Surgery | Admitting: Physical Therapy

## 2021-09-30 ENCOUNTER — Encounter (HOSPITAL_COMMUNITY): Payer: Self-pay | Admitting: Physical Therapy

## 2021-09-30 DIAGNOSIS — M5459 Other low back pain: Secondary | ICD-10-CM | POA: Diagnosis present

## 2021-09-30 DIAGNOSIS — M545 Low back pain, unspecified: Secondary | ICD-10-CM | POA: Diagnosis not present
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
## 2021-10-06 ENCOUNTER — Telehealth: Payer: Self-pay | Admitting: Adult Health

## 2021-10-06 ENCOUNTER — Ambulatory Visit: Payer: 59 | Admitting: Orthopaedic Surgery

## 2021-10-06 NOTE — Telephone Encounter (Signed)
Pt can have quant at lab. Ok per [employer]. Pt wants to go on Monday. JSY

## 2021-10-06 NOTE — Telephone Encounter (Signed)
Patient called this morning she missed her upt test for yesterday due to ride issues. She is asking if we will be a order in for her to go to the lab for pregnancy blood work instead of doing the urine.  She states that she is going to try and get a ride to go today.

## 2021-10-11 ENCOUNTER — Other Ambulatory Visit: Payer: 59

## 2021-10-11 DIAGNOSIS — Z3490 Encounter for supervision of normal pregnancy, unspecified, unspecified trimester: Secondary | ICD-10-CM

## 2021-10-12 LAB — BETA HCG QUANT (REF LAB): hCG Quant: <1 m[IU]/mL

## 2021-10-18 ENCOUNTER — Ambulatory Visit: Payer: 59

## 2021-10-19 ENCOUNTER — Other Ambulatory Visit: Payer: Self-pay | Admitting: Women's Health

## 2021-10-22 ENCOUNTER — Encounter (HOSPITAL_COMMUNITY): Payer: 59 | Admitting: Physical Therapy

## 2021-10-22 ENCOUNTER — Telehealth (HOSPITAL_COMMUNITY): Payer: Self-pay | Admitting: Physical Therapy

## 2021-10-22 NOTE — Telephone Encounter (Signed)
No show #1 Left VM to call if she is unable to make next appointment, scheduled 10/3 at [place], PT, DPT Physical Therapist [place] [facility]

## 2021-10-26 ENCOUNTER — Encounter (HOSPITAL_COMMUNITY): Payer: 59 | Admitting: Physical Therapy

## 2021-10-26 ENCOUNTER — Telehealth (HOSPITAL_COMMUNITY): Payer: Self-pay | Admitting: Physical Therapy

## 2021-10-26 NOTE — Telephone Encounter (Signed)
#  2 No-show - left VM at number provided above. Next appointment retained 10/5 at 8:15a, remaining appointments cancelled per dept guidelines - requested pt call if unable to make next appointment.  Candie Mile, PT, DPT Physical Therapist Acute Rehabilitation Services [place] [facility]

## 2021-10-28 ENCOUNTER — Encounter (HOSPITAL_COMMUNITY): Payer: 59 | Admitting: Physical Therapy

## 2021-10-28 ENCOUNTER — Telehealth (HOSPITAL_COMMUNITY): Payer: Self-pay | Admitting: Physical Therapy

## 2021-10-28 NOTE — Telephone Encounter (Signed)
No show #3 - per departmental guidelines patient patient will be d/c from physical therapy for this episode of care.  Candie Mile, PT, DPT Physical Therapist Acute Rehabilitation Services [place] [facility]

## 2021-11-02 ENCOUNTER — Encounter (HOSPITAL_COMMUNITY): Payer: 59

## 2021-11-04 ENCOUNTER — Encounter (HOSPITAL_COMMUNITY): Payer: 59 | Admitting: Physical Therapy

## 2021-11-09 ENCOUNTER — Encounter (HOSPITAL_COMMUNITY): Payer: 59 | Admitting: Physical Therapy

## 2021-11-11 ENCOUNTER — Encounter (HOSPITAL_COMMUNITY): Payer: 59

## 2021-12-01 ENCOUNTER — Ambulatory Visit (INDEPENDENT_AMBULATORY_CARE_PROVIDER_SITE_OTHER): Payer: 59 | Admitting: *Deleted

## 2021-12-01 ENCOUNTER — Other Ambulatory Visit: Payer: Self-pay | Admitting: Obstetrics & Gynecology

## 2021-12-01 VITALS — BP 149/93 | HR 86 | Wt 337.0 lb

## 2021-12-01 DIAGNOSIS — Z3201 Encounter for pregnancy test, result positive: Secondary | ICD-10-CM | POA: Diagnosis not present

## 2021-12-01 DIAGNOSIS — I10 Essential (primary) hypertension: Secondary | ICD-10-CM

## 2021-12-01 LAB — POCT URINE PREGNANCY: Preg Test, Ur: POSITIVE — AB

## 2021-12-01 MED ORDER — LABETALOL HCL 200 MG PO TABS: 200.0000 mg | ORAL_TABLET | Freq: Two times a day (BID) | ORAL | 6 refills | Status: DC

## 2021-12-01 NOTE — Progress Notes (Signed)
Rx sent in for Labetalol.  Pt previously on Losartan and currently pregnant

## 2021-12-01 NOTE — Progress Notes (Signed)
   NURSE VISIT- PREGNANCY CONFIRMATION   SUBJECTIVE:  Madison Mcgrath is a 31 y.o. G31P0011 female at Unknown by uncertain LMP of No LMP recorded. Here for pregnancy confirmation.  Home pregnancy test: positive x 2   She reports no complaints.  She is taking prenatal vitamins.    OBJECTIVE:  BP (!) 149/93 (BP Location: Right Arm, Patient Position: Sitting, Cuff Size: Normal)   Pulse 86   Wt (!) 337 lb (152.9 kg)   BMI 56.08 kg/m   Appears well, in no apparent distress  Results for orders placed or performed in visit on 12/01/21 (from the past 24 hour(s))  POCT urine pregnancy   Collection Time: 12/01/21  3:18 PM  Result Value Ref Range   Preg Test, Ur Positive (A) Negative    ASSESSMENT: Positive pregnancy test, Unknown by LMP    PLAN: Schedule for dating ultrasound in pending hcg days Prenatal vitamins: continue   Nausea medicines: not currently needed   OB packet given: Yes  Pt started on Labetalol 200 mg BID, to return in 2 weeks for BP Check.   Annamarie Dawley  12/01/2021 3:38 PM

## 2021-12-02 LAB — BETA HCG QUANT (REF LAB): hCG Quant: 2154 m[IU]/mL

## 2021-12-06 ENCOUNTER — Other Ambulatory Visit: Payer: Self-pay | Admitting: Obstetrics & Gynecology

## 2021-12-09 ENCOUNTER — Other Ambulatory Visit: Payer: Self-pay | Admitting: Obstetrics & Gynecology

## 2021-12-09 ENCOUNTER — Telehealth: Payer: Self-pay | Admitting: *Deleted

## 2021-12-09 ENCOUNTER — Encounter: Payer: Self-pay | Admitting: *Deleted

## 2021-12-09 MED ORDER — COMPLETENATE 29-1 MG PO CHEW: 1.0000 | CHEWABLE_TABLET | Freq: Every day | ORAL | 11 refills | Status: DC

## 2021-12-09 NOTE — Telephone Encounter (Signed)
Pt is requesting a prenatal vit be sent to her pharmacy. Thanks! JSY

## 2021-12-12 ENCOUNTER — Other Ambulatory Visit: Payer: Self-pay | Admitting: Women's Health

## 2021-12-22 ENCOUNTER — Ambulatory Visit (INDEPENDENT_AMBULATORY_CARE_PROVIDER_SITE_OTHER): Payer: 59

## 2021-12-22 VITALS — BP 107/77 | HR 90 | Ht 65.0 in | Wt 334.5 lb

## 2021-12-22 DIAGNOSIS — Z0130 Encounter for examination of blood pressure without abnormal findings: Secondary | ICD-10-CM

## 2021-12-22 DIAGNOSIS — Z3A01 Less than 8 weeks gestation of pregnancy: Secondary | ICD-10-CM

## 2021-12-22 NOTE — Progress Notes (Signed)
   NURSE VISIT- BLOOD PRESSURE CHECK  SUBJECTIVE:  Madison Mcgrath is a 31 y.o. G63P0011 female here for BP check. She  is pregnant [redacted] weeks and 1 day gestation.      HYPERTENSION ROS:  Pregnant/postpartum:  Severe headaches that don't go away with tylenol/other medicines: No  Visual changes (seeing spots/double/blurred vision) No  Severe pain under right breast breast or in center of upper chest No  Severe nausea/vomiting No  Taking medicines as instructed yes   OBJECTIVE:  BP 107/77 (BP Location: Right Arm, Patient Position: Sitting, Cuff Size: Normal)   Pulse 90   Ht 5\' 5"  (1.651 m)   Wt (!) 334 lb 8 oz (151.7 kg)   BMI 55.66 kg/m   Appearance alert, well appearing, and in no distress.  ASSESSMENT: pregnant [redacted] weeks and 1 day gestation.   blood pressure check  PLAN: Discussed with Dr.   Recommendations: no changes needed   Follow-up: as scheduled   Charlotta Newton  12/22/2021 12:36 PM

## 2021-12-22 NOTE — Progress Notes (Signed)
[place] 8+1 wks,single IUP with yolk sac,FHR 171 bpm,normal ovaries

## 2021-12-24 ENCOUNTER — Other Ambulatory Visit: Payer: 59

## 2022-01-24 NOTE — L&D Delivery Note (Signed)
OB/GYN Faculty Practice Delivery Note  Madison Mcgrath is a 32 y.o. G3P1011 s/p VD at [redacted]w[redacted]d. She was admitted for SOL.   ROM: 3h 31m with clear, yellow fluid GBS Status:  Negative/-- (06/11 1330) Maximum Maternal Temperature: 98.3  Labor Progress: Initial SVE: 3/50/-3. She then progressed to complete.   Delivery Date/Time: 07/26/2022 @0438  Delivery: Called to room and patient was complete and pushing. Head delivered OA. No nuchal cord present. A shoulder dystocia was identified. The events are as followed:  Delivery of the head: 12/21/2019  9:15 AM First maneuver: 12/21/2019  9:15 AM, McRoberts Second maneuver: 12/21/2019  9:16 AM, Suprapubic Pressure McRoberts Posterior arm delivered Infant with spontaneous cry, placed on mother's abdomen, dried and stimulated. Cord clamped x 2 after 1-minute delay, and cut by the friend of the patient. Cord blood drawn. Placenta delivered spontaneously with gentle cord traction. Fundus firm with massage and Pitocin. Labia, perineum, vagina, and cervix inspected. There were no laceration.   Baby Weight: pending  Placenta: 3 vessel, intact. Sent to L&D Complications: None Lacerations: None EBL: 502 mL Analgesia: Epidural   Infant:  APGAR (1 MIN): 9   APGAR (5 MINS): 9

## 2022-01-25 ENCOUNTER — Encounter: Payer: Self-pay | Admitting: Advanced Practice Midwife

## 2022-01-25 ENCOUNTER — Other Ambulatory Visit: Payer: Self-pay | Admitting: Obstetrics & Gynecology

## 2022-01-25 DIAGNOSIS — Z3490 Encounter for supervision of normal pregnancy, unspecified, unspecified trimester: Secondary | ICD-10-CM | POA: Insufficient documentation

## 2022-01-25 DIAGNOSIS — O0992 Supervision of high risk pregnancy, unspecified, second trimester: Secondary | ICD-10-CM | POA: Insufficient documentation

## 2022-01-25 DIAGNOSIS — Z3682 Encounter for antenatal screening for nuchal translucency: Secondary | ICD-10-CM

## 2022-01-26 ENCOUNTER — Other Ambulatory Visit (HOSPITAL_COMMUNITY)
Admission: RE | Admit: 2022-01-26 | Discharge: 2022-01-26 | Disposition: A | Discharge status: Home / Self Care | Payer: 59 | Source: Ambulatory Visit | Attending: Advanced Practice Midwife | Admitting: Advanced Practice Midwife

## 2022-01-26 ENCOUNTER — Ambulatory Visit (INDEPENDENT_AMBULATORY_CARE_PROVIDER_SITE_OTHER): Payer: 59

## 2022-01-26 ENCOUNTER — Ambulatory Visit (INDEPENDENT_AMBULATORY_CARE_PROVIDER_SITE_OTHER): Payer: 59 | Admitting: Advanced Practice Midwife

## 2022-01-26 ENCOUNTER — Encounter: Payer: 59 | Admitting: *Deleted

## 2022-01-26 ENCOUNTER — Encounter: Payer: Self-pay | Admitting: Advanced Practice Midwife

## 2022-01-26 VITALS — BP 124/81 | HR 90 | Wt 339.0 lb

## 2022-01-26 DIAGNOSIS — Z1151 Encounter for screening for human papillomavirus (HPV): Secondary | ICD-10-CM | POA: Diagnosis not present

## 2022-01-26 DIAGNOSIS — I10 Essential (primary) hypertension: Secondary | ICD-10-CM

## 2022-01-26 DIAGNOSIS — O10919 Unspecified pre-existing hypertension complicating pregnancy, unspecified trimester: Secondary | ICD-10-CM

## 2022-01-26 DIAGNOSIS — Z113 Encounter for screening for infections with a predominantly sexual mode of transmission: Secondary | ICD-10-CM

## 2022-01-26 DIAGNOSIS — F909 Attention-deficit hyperactivity disorder, unspecified type: Secondary | ICD-10-CM

## 2022-01-26 DIAGNOSIS — Z3A13 13 weeks gestation of pregnancy: Secondary | ICD-10-CM

## 2022-01-26 DIAGNOSIS — F319 Bipolar disorder, unspecified: Secondary | ICD-10-CM

## 2022-01-26 DIAGNOSIS — O09891 Supervision of other high risk pregnancies, first trimester: Secondary | ICD-10-CM | POA: Insufficient documentation

## 2022-01-26 DIAGNOSIS — Z3480 Encounter for supervision of other normal pregnancy, unspecified trimester: Secondary | ICD-10-CM | POA: Insufficient documentation

## 2022-01-26 DIAGNOSIS — Z131 Encounter for screening for diabetes mellitus: Secondary | ICD-10-CM

## 2022-01-26 DIAGNOSIS — O0992 Supervision of high risk pregnancy, unspecified, second trimester: Secondary | ICD-10-CM

## 2022-01-26 DIAGNOSIS — N898 Other specified noninflammatory disorders of vagina: Secondary | ICD-10-CM

## 2022-01-26 DIAGNOSIS — Z3682 Encounter for antenatal screening for nuchal translucency: Secondary | ICD-10-CM

## 2022-01-26 DIAGNOSIS — Z8632 Personal history of gestational diabetes: Secondary | ICD-10-CM

## 2022-01-26 DIAGNOSIS — Z124 Encounter for screening for malignant neoplasm of cervix: Secondary | ICD-10-CM

## 2022-01-26 DIAGNOSIS — E039 Hypothyroidism, unspecified: Secondary | ICD-10-CM

## 2022-01-26 DIAGNOSIS — Z363 Encounter for antenatal screening for malformations: Secondary | ICD-10-CM

## 2022-01-26 MED ORDER — LEVOTHYROXINE SODIUM 25 MCG PO TABS: 25.0000 ug | ORAL_TABLET | Freq: Every day | ORAL | 6 refills | Status: DC

## 2022-01-26 MED ORDER — ASPIRIN 81 MG PO CHEW: 162.0000 mg | CHEWABLE_TABLET | Freq: Every day | ORAL | 7 refills | Status: DC

## 2022-01-26 MED ORDER — LABETALOL HCL 200 MG PO TABS: 200.0000 mg | ORAL_TABLET | Freq: Two times a day (BID) | ORAL | 6 refills | Status: DC

## 2022-01-26 MED ORDER — TERCONAZOLE 0.4 % VA CREA: 1.0000 | TOPICAL_CREAM | Freq: Every day | VAGINAL | 0 refills | Status: DC

## 2022-01-26 NOTE — Patient Instructions (Signed)
Madison Mcgrath, thank you for choosing our office today! We appreciate the opportunity to meet your healthcare needs. You may receive a short survey by mail, e-mail, or through [employer]. If you are happy with your care we would appreciate if you could take just a few minutes to complete the survey questions. We read all of your comments and take your feedback very seriously. Thank you again for choosing our office.  [facility] at [place] at [facility] ([place], [place] [address]) Entrance C, located off of [place] parking   Nausea & Vomiting Have saltine crackers or pretzels by your bed and eat a few bites before you raise your head out of bed in the morning Eat small frequent meals throughout the day instead of large meals Drink plenty of fluids throughout the day to stay hydrated, just don't drink a lot of fluids with your meals.  This can make your stomach fill up faster making you feel sick Do not brush your teeth right after you eat Products with real ginger are good for nausea, like ginger ale and ginger hard candy Make sure it says made with real ginger! Sucking on sour candy like lemon heads is also good for nausea If your prenatal vitamins make you nauseated, take them at night so you will sleep through the nausea Sea Bands If you feel like you need medicine for the nausea & vomiting please let us know If you are unable to keep any fluids or food down please let us know   Constipation Drink plenty of fluid, preferably water, throughout the day Eat foods high in fiber such as fruits, vegetables, and grains Exercise, such as walking, is a good way to keep your bowels regular Drink warm fluids, especially warm prune juice, or decaf coffee Eat a 1/2 cup of real oatmeal (not instant), 1/2 cup applesauce, and 1/2-1 cup warm prune juice every day If needed, you may take Colace (docusate sodium) stool softener  once or twice a day to help keep the stool soft.  If you still are having problems with constipation, you may take Miralax once daily as needed to help keep your bowels regular.   Home Blood Pressure Monitoring for Patients   Your provider has recommended that you check your blood pressure (BP) at least once a week at home. If you do not have a blood pressure cuff at home, one will be provided for you. Contact your provider if you have not received your monitor within 1 week.   Helpful Tips for Accurate Home Blood Pressure Checks  Don't smoke, exercise, or drink caffeine 30 minutes before checking your BP Use the restroom before checking your BP (a full bladder can raise your pressure) Relax in a comfortable upright chair Feet on the ground Left arm resting comfortably on a flat surface at the level of your heart Legs uncrossed Back supported Sit quietly and don't talk Place the cuff on your bare arm Adjust snuggly, so that only two fingertips can fit between your skin and the top of the cuff Check 2 readings separated by at least one minute Keep a log of your BP readings For a visual, please reference this diagram: http://ccnc.care/bpdiagram  Provider Name: [facility]     Phone: [phone]  Zone 1: ALL CLEAR  Continue to monitor your symptoms:  BP reading is less than 140 (top number) or less than 90 (bottom  number)  No right upper stomach pain No headaches or seeing spots No feeling nauseated or throwing up No swelling in face and hands  Zone 2: CAUTION Call your doctor's office for any of the following:  BP reading is greater than 140 (top number) or greater than 90 (bottom number)  Stomach pain under your ribs in the middle or right side Headaches or seeing spots Feeling nauseated or throwing up Swelling in face and hands  Zone 3: EMERGENCY  Seek immediate medical care if you have any of the following:  BP reading is greater than160 (top number) or greater than  110 (bottom number) Severe headaches not improving with Tylenol Serious difficulty catching your breath Any worsening symptoms from Zone 2    First Trimester of Pregnancy The first trimester of pregnancy is from week 1 until the end of week 12 (months 1 through 3). A week after a sperm fertilizes an egg, the egg will implant on the wall of the uterus. This embryo will begin to develop into a baby. Genes from you and your partner are forming the baby. The female genes determine whether the baby is a boy or a girl. At 6-8 weeks, the eyes and face are formed, and the heartbeat can be seen on ultrasound. At the end of 12 weeks, all the baby's organs are formed.  Now that you are pregnant, you will want to do everything you can to have a healthy baby. Two of the most important things are to get good prenatal care and to follow your health care provider's instructions. Prenatal care is all the medical care you receive before the baby's birth. This care will help prevent, find, and treat any problems during the pregnancy and childbirth. BODY CHANGES Your body goes through many changes during pregnancy. The changes vary from woman to woman.  You may gain or lose a couple of pounds at first. You may feel sick to your stomach (nauseous) and throw up (vomit). If the vomiting is uncontrollable, call your health care provider. You may tire easily. You may develop headaches that can be relieved by medicines approved by your health care provider. You may urinate more often. Painful urination may mean you have a bladder infection. You may develop heartburn as a result of your pregnancy. You may develop constipation because certain hormones are causing the muscles that push waste through your intestines to slow down. You may develop hemorrhoids or swollen, bulging veins (varicose veins). Your breasts may begin to grow larger and become tender. Your nipples may stick out more, and the tissue that surrounds them  (areola) may become darker. Your gums may bleed and may be sensitive to brushing and flossing. Dark spots or blotches (chloasma, mask of pregnancy) may develop on your face. This will likely fade after the baby is born. Your menstrual periods will stop. You may have a loss of appetite. You may develop cravings for certain kinds of food. You may have changes in your emotions from day to day, such as being excited to be pregnant or being concerned that something may go wrong with the pregnancy and baby. You may have more vivid and strange dreams. You may have changes in your hair. These can include thickening of your hair, rapid growth, and changes in texture. Some women also have hair loss during or after pregnancy, or hair that feels dry or thin. Your hair will most likely return to normal after your baby is born. WHAT TO EXPECT AT YOUR PRENATAL  VISITS During a routine prenatal visit: You will be weighed to make sure you and the baby are growing normally. Your blood pressure will be taken. Your abdomen will be measured to track your baby's growth. The fetal heartbeat will be listened to starting around week 10 or 12 of your pregnancy. Test results from any previous visits will be discussed. Your health care provider may ask you: How you are feeling. If you are feeling the baby move. If you have had any abnormal symptoms, such as leaking fluid, bleeding, severe headaches, or abdominal cramping. If you have any questions. Other tests that may be performed during your first trimester include: Blood tests to find your blood type and to check for the presence of any previous infections. They will also be used to check for low iron levels (anemia) and Rh antibodies. Later in the pregnancy, blood tests for diabetes will be done along with other tests if problems develop. Urine tests to check for infections, diabetes, or protein in the urine. An ultrasound to confirm the proper growth and development  of the baby. An amniocentesis to check for possible genetic problems. Fetal screens for spina bifida and Down syndrome. You may need other tests to make sure you and the baby are doing well. HOME CARE INSTRUCTIONS  Medicines Follow your health care provider's instructions regarding medicine use. Specific medicines may be either safe or unsafe to take during pregnancy. Take your prenatal vitamins as directed. If you develop constipation, try taking a stool softener if your health care provider approves. Diet Eat regular, well-balanced meals. Choose a variety of foods, such as meat or vegetable-based protein, fish, milk and low-fat dairy products, vegetables, fruits, and whole grain breads and cereals. Your health care provider will help you determine the amount of weight gain that is right for you. Avoid raw meat and uncooked cheese. These carry germs that can cause birth defects in the baby. Eating four or five small meals rather than three large meals a day may help relieve nausea and vomiting. If you start to feel nauseous, eating a few soda crackers can be helpful. Drinking liquids between meals instead of during meals also seems to help nausea and vomiting. If you develop constipation, eat more high-fiber foods, such as fresh vegetables or fruit and whole grains. Drink enough fluids to keep your urine clear or pale yellow. Activity and Exercise Exercise only as directed by your health care provider. Exercising will help you: Control your weight. Stay in shape. Be prepared for labor and delivery. Experiencing pain or cramping in the lower abdomen or low back is a good sign that you should stop exercising. Check with your health care provider before continuing normal exercises. Try to avoid standing for long periods of time. Move your legs often if you must stand in one place for a long time. Avoid heavy lifting. Wear low-heeled shoes, and practice good posture. You may continue to have sex  unless your health care provider directs you otherwise. Relief of Pain or Discomfort Wear a good support bra for breast tenderness.   Take warm sitz baths to soothe any pain or discomfort caused by hemorrhoids. Use hemorrhoid cream if your health care provider approves.   Rest with your legs elevated if you have leg cramps or low back pain. If you develop varicose veins in your legs, wear support hose. Elevate your feet for 15 minutes, 3-4 times a day. Limit salt in your diet. Prenatal Care Schedule your prenatal visits by the  twelfth week of pregnancy. They are usually scheduled monthly at first, then more often in the last 2 months before delivery. Write down your questions. Take them to your prenatal visits. Keep all your prenatal visits as directed by your health care provider. Safety Wear your seat belt at all times when driving. Make a list of emergency phone numbers, including numbers for family, friends, the hospital, and police and fire departments. General Tips Ask your health care provider for a referral to a local prenatal education class. Begin classes no later than at the beginning of month 6 of your pregnancy. Ask for help if you have counseling or nutritional needs during pregnancy. Your health care provider can offer advice or refer you to specialists for help with various needs. Do not use hot tubs, steam rooms, or saunas. Do not douche or use tampons or scented sanitary pads. Do not cross your legs for long periods of time. Avoid cat litter boxes and soil used by cats. These carry germs that can cause birth defects in the baby and possibly loss of the fetus by miscarriage or stillbirth. Avoid all smoking, herbs, alcohol, and medicines not prescribed by your health care provider. Chemicals in these affect the formation and growth of the baby. Schedule a dentist appointment. At home, brush your teeth with a soft toothbrush and be gentle when you floss. SEEK MEDICAL CARE IF:   You have dizziness. You have mild pelvic cramps, pelvic pressure, or nagging pain in the abdominal area. You have persistent nausea, vomiting, or diarrhea. You have a bad smelling vaginal discharge. You have pain with urination. You notice increased swelling in your face, hands, legs, or ankles. SEEK IMMEDIATE MEDICAL CARE IF:  You have a fever. You are leaking fluid from your vagina. You have spotting or bleeding from your vagina. You have severe abdominal cramping or pain. You have rapid weight gain or loss. You vomit blood or material that looks like coffee grounds. You are exposed to [place] measles and have never had them. You are exposed to fifth disease or chickenpox. You develop a severe headache. You have shortness of breath. You have any kind of trauma, such as from a fall or a car accident. Document Released: 01/04/2001 Document Revised: 05/27/2013 Document Reviewed: 11/20/2012 [facility] Patient Information 2015 [place], [place]. This information is not intended to replace advice given to you by your health care provider. Make sure you discuss any questions you have with your health care provider.

## 2022-01-26 NOTE — Progress Notes (Signed)
[place] 13+1 wks,measurements c/w dates,anterior placenta,normal ovaries,FHR 154 bpm,CRL 74.83 mm,unable to obtain NT and NB because of pt body habitus and fetal position

## 2022-01-26 NOTE — Progress Notes (Signed)
INITIAL OBSTETRICAL VISIT Patient name: Madison Mcgrath MRN [id number]  Date of birth: [birth year] Chief Complaint:   Initial Prenatal Visit (Feeling bad after eating)  History of Present Illness:   Madison Mcgrath is a 32 y.o. G32P1011 Caucasian female at [redacted]w[redacted]d by [place] at 8.1 weeks with an Estimated Date of Delivery: 08/02/22 being seen today for her initial obstetrical visit.   No LMP recorded. Patient is pregnant. Her obstetrical history is significant for  SVB 2022 (cHTN, A2/B GDM) .   Today she reports  having concerns with food not tasting good/sense of smell being 'off'; +vag itching .  Last pap Sept 2020. Results were: NILM w/ HRHPV negative     01/26/2022    9:50 AM 03/15/2021   10:34 AM 10/14/2020   11:04 AM 07/29/2020   10:24 AM 07/08/2020    1:54 PM  Depression screen PHQ 2/9  Decreased Interest 0 0 0 0 0  Down, Depressed, Hopeless 0 0 0 1 0  PHQ - 2 Score 0 0 0 1 0  Altered sleeping 0 0     Tired, decreased energy 0 3     Change in appetite 0 3     Feeling bad or failure about yourself  0 0     Trouble concentrating 0 2     Moving slowly or fidgety/restless 0 0     Suicidal thoughts 0 0     PHQ-9 Score 0 8           01/26/2022    9:51 AM 03/15/2021   10:35 AM 06/18/2020    9:51 AM  GAD 7 : Generalized Anxiety Score  Nervous, Anxious, on Edge 0 1 0  Control/stop worrying 0 0 1  Worry too much - different things 0 2 1  Trouble relaxing 0 0 0  Restless 0 0 0  Easily annoyed or irritable 0 3 1  Afraid - awful might happen 0 2 0  Total GAD 7 Score 0 8 3     Review of Systems:   Pertinent items are noted in HPI Denies cramping/contractions, leakage of fluid, vaginal bleeding, abnormal vaginal discharge w/ itching/odor/irritation, headaches, visual changes, shortness of breath, chest pain, abdominal pain, severe nausea/vomiting, or problems with urination or bowel movements unless otherwise stated above.  Pertinent History Reviewed:  Reviewed past medical,surgical, social,  obstetrical and family history.  Reviewed problem list, medications and allergies. OB History  Gravida Para Term Preterm AB Living  3 1 1  0 1 1  SAB IAB Ectopic Multiple Live Births  1 0 0 0 1    # Outcome Date GA Lbr Len/2nd Weight Sex Delivery Anes PTL Lv  3 Current           2 Term 12/07/20 [redacted]w[redacted]d  8 lb 5 oz (3.771 kg) F Vag-Spont EPI N LIV     Complications: Gestational diabetes  1 SAB            Physical Assessment:   Vitals:   01/26/22 0944  BP: 124/81  Pulse: 90  Weight: (!) 339 lb (153.8 kg)  Body mass index is 56.41 kg/m.       Physical Examination:  General appearance - well appearing, and in no distress  Mental status - alert, oriented to person, place, and time  Psych:  She has a normal mood and affect  Skin - warm and dry, normal color, no suspicious lesions noted  Chest - effort normal, all lung fields clear  to auscultation bilaterally  Heart - normal rate and regular rhythm  Abdomen - soft, nontender  Extremities:  No swelling or varicosities noted  Pelvic - VULVA: normal appearing vulva with no masses, tenderness or lesions  VAGINA: normal appearing vagina with normal color and discharge, no lesions  CERVIX: normal appearing cervix without discharge or lesions, no CMT  Thin prep pap is done with HR HPV cotesting  Chaperone: Celene Squibb    TODAY'S NT (unable to obtain)  No results found for this or any previous visit (from the past 24 hour(s)).  Assessment & Plan:  1) High-Risk Pregnancy G3P1011 at [redacted]w[redacted]d with an Estimated Date of Delivery: 08/02/22   2) Initial OB visit  3) cHTN, stable BP on Labetalol 200mg  bid; rx bASA 162mg /day; growth q 4wks starting at 24wks; testing @ 32wks  4) Hx GDM, HgbA1c today  5) Morbid obesity, rec weight gain <20lbs  6) Hypothyroid, on Synthroid 69mcg, get thyroid labs today  7) Bipolar/ADHD, stable on Lamictal 150mg  bid  8) +vaginal itching, rx Terazol 7  Meds:  Meds ordered this encounter  Medications    aspirin 81 MG chewable tablet    Sig: Chew 2 tablets (162 mg total) by mouth daily.    Dispense:  60 tablet    Refill:  7    Order Specific Question:   Supervising Provider    Answer:   Tania Ade H [[id number]]   terconazole (TERAZOL 7) 0.4 % vaginal cream    Sig: Place 1 applicator vaginally at bedtime.    Dispense:  45 g    Refill:  0    Order Specific Question:   Supervising Provider    Answer:   Elonda Husky, LUTHER H [[id number]]   labetalol (NORMODYNE) 200 MG tablet    Sig: Take 1 tablet (200 mg total) by mouth 2 (two) times daily.    Dispense:  60 tablet    Refill:  6    Order Specific Question:   Supervising Provider    Answer:   Florian Buff [[id number]]   levothyroxine (SYNTHROID) 25 MCG tablet    Sig: Take 1 tablet (25 mcg total) by mouth daily before breakfast.    Dispense:  30 tablet    Refill:  6    Order Specific Question:   Supervising Provider    Answer:   Florian Buff [[id number]]    Initial labs obtained Continue prenatal vitamins Reviewed n/v relief measures and warning s/s to report Reviewed recommended weight gain based on pre-gravid BMI Encouraged well-balanced diet Genetic & carrier screening discussed: requests Panorama and NT/IT, requests NT/IT (unable to obtain; will get AFP at next visit) Ultrasound discussed; fetal survey: requested [facility] completed> form faxed if has or is planning to apply for medicaid The nature of [place] for [employer] with multiple MDs and other Advanced Practice Providers was explained to patient; also emphasized that fellows, residents, and students are part of our team. Does have home bp cuff. Office bp cuff given: no. Rx sent: n/a. Check bp weekly, let us know if consistently >140/90.   Indications for ASA therapy (per uptodate) One of the following: CHTN Yes  Indications for early A1C (per uptodate) BMI >=25 (>=23 in Asian women) AND one of the following HTN or on therapy for hypertension Yes  Follow-up: Return for 4wk  HROB and AFP: 7wk HROB and anatomy u/s.   Orders Placed This Encounter  Procedures   Urine Culture   US OB Comp +  14 Wk   HORIZON CUSTOM   PANORAMA PRENATAL TEST FULL PANEL   Protein / creatinine ratio, urine   CBC/D/Plt+RPR+Rh+ABO+RubIgG...   Comprehensive metabolic panel   Hemoglobin A1c   TSH + free T4    Myrtis Ser [facility] 01/26/2022 11:01 AM

## 2022-01-27 LAB — CBC/D/PLT+RPR+RH+ABO+RUBIGG...
Antibody Screen: NEGATIVE
Basophils Absolute: 0 10*3/uL (ref 0.0–0.2)
Basos: 0 %
EOS (ABSOLUTE): 0 10*3/uL (ref 0.0–0.4)
Eos: 0 %
HCV Ab: NONREACTIVE
HIV Screen 4th Generation wRfx: NONREACTIVE
Hematocrit: 41.4 % (ref 34.0–46.6)
Hemoglobin: 12.9 g/dL (ref 11.1–15.9)
Hepatitis B Surface Ag: NEGATIVE
Immature Grans (Abs): 0 10*3/uL (ref 0.0–0.1)
Immature Granulocytes: 0 %
Lymphocytes Absolute: 3 10*3/uL (ref 0.7–3.1)
Lymphs: 31 %
MCH: 24.7 pg — ABNORMAL LOW (ref 26.6–33.0)
MCHC: 31.2 g/dL — ABNORMAL LOW (ref 31.5–35.7)
MCV: 79 fL (ref 79–97)
Monocytes Absolute: 0.5 10*3/uL (ref 0.1–0.9)
Monocytes: 5 %
Neutrophils Absolute: 6.2 10*3/uL (ref 1.4–7.0)
Neutrophils: 64 %
Platelets: 285 10*3/uL (ref 150–450)
RBC: 5.23 x10E6/uL (ref 3.77–5.28)
RDW: 14.8 % (ref 11.7–15.4)
RPR Ser Ql: NONREACTIVE
Rh Factor: POSITIVE
Rubella Antibodies, IGG: 5.21 index (ref >0.99)
WBC: 9.8 10*3/uL (ref 3.4–10.8)

## 2022-01-27 LAB — HEMOGLOBIN A1C
Est. average glucose Bld gHb Est-mCnc: 126 mg/dL
Hgb A1c MFr Bld: 6 % — ABNORMAL HIGH (ref 4.8–5.6)

## 2022-01-27 LAB — TSH+FREE T4
Free T4: 0.89 ng/dL (ref 0.82–1.77)
TSH: 3.02 u[IU]/mL (ref 0.450–4.500)

## 2022-01-27 LAB — COMPREHENSIVE METABOLIC PANEL
ALT: 10 IU/L (ref 0–32)
AST: 8 IU/L (ref 0–40)
Albumin/Globulin Ratio: 1.9 (ref 1.2–2.2)
Albumin: 4.3 g/dL (ref 3.9–4.9)
Alkaline Phosphatase: 67 IU/L (ref 44–121)
BUN/Creatinine Ratio: 13 (ref 9–23)
BUN: 6 mg/dL (ref 6–20)
Bilirubin Total: <0.2 mg/dL (ref 0.0–1.2)
CO2: 18 mmol/L — ABNORMAL LOW (ref 20–29)
Calcium: 9.7 mg/dL (ref 8.7–10.2)
Chloride: 101 mmol/L (ref 96–106)
Creatinine, Ser: 0.45 mg/dL — ABNORMAL LOW (ref 0.57–1.00)
Globulin, Total: 2.3 g/dL (ref 1.5–4.5)
Glucose: 110 mg/dL — ABNORMAL HIGH (ref 70–99)
Potassium: 4.2 mmol/L (ref 3.5–5.2)
Sodium: 137 mmol/L (ref 134–144)
Total Protein: 6.6 g/dL (ref 6.0–8.5)
eGFR: 130 mL/min/{1.73_m2} (ref >59)

## 2022-01-27 LAB — HCV INTERPRETATION

## 2022-01-27 LAB — PROTEIN / CREATININE RATIO, URINE
Creatinine, Urine: 174.8 mg/dL
Protein, Ur: 17.2 mg/dL
Protein/Creat Ratio: 98 mg/g creat (ref 0–200)

## 2022-01-28 ENCOUNTER — Encounter: Payer: Self-pay | Admitting: Women's Health

## 2022-01-28 LAB — CYTOLOGY - PAP
Chlamydia: NEGATIVE
Comment: NEGATIVE
Comment: NEGATIVE
Comment: NORMAL
Diagnosis: NEGATIVE
High risk HPV: NEGATIVE
Neisseria Gonorrhea: NEGATIVE

## 2022-01-29 LAB — URINE CULTURE

## 2022-02-09 ENCOUNTER — Encounter: Payer: Self-pay | Admitting: Women's Health

## 2022-02-09 LAB — HORIZON CUSTOM: REPORT SUMMARY: NEGATIVE

## 2022-02-21 LAB — PANORAMA PRENATAL TEST FULL PANEL:PANORAMA TEST PLUS 5 ADDITIONAL MICRODELETIONS: FETAL FRACTION: 4.6

## 2022-02-23 ENCOUNTER — Encounter: Payer: Self-pay | Admitting: Obstetrics & Gynecology

## 2022-02-23 ENCOUNTER — Encounter: Payer: Self-pay | Admitting: Women's Health

## 2022-02-23 ENCOUNTER — Ambulatory Visit (INDEPENDENT_AMBULATORY_CARE_PROVIDER_SITE_OTHER): Payer: 59 | Admitting: Obstetrics & Gynecology

## 2022-02-23 VITALS — BP 142/86 | HR 92 | Wt 340.8 lb

## 2022-02-23 DIAGNOSIS — O0992 Supervision of high risk pregnancy, unspecified, second trimester: Secondary | ICD-10-CM

## 2022-02-23 DIAGNOSIS — Z3A17 17 weeks gestation of pregnancy: Secondary | ICD-10-CM

## 2022-02-23 DIAGNOSIS — K219 Gastro-esophageal reflux disease without esophagitis: Secondary | ICD-10-CM

## 2022-02-23 DIAGNOSIS — I10 Essential (primary) hypertension: Secondary | ICD-10-CM

## 2022-02-23 DIAGNOSIS — O0990 Supervision of high risk pregnancy, unspecified, unspecified trimester: Secondary | ICD-10-CM

## 2022-02-23 MED ORDER — FAMOTIDINE 20 MG PO TABS: 20.0000 mg | ORAL_TABLET | Freq: Two times a day (BID) | ORAL | 11 refills | Status: AC

## 2022-02-23 MED ORDER — LABETALOL HCL 300 MG PO TABS: 300.0000 mg | ORAL_TABLET | Freq: Two times a day (BID) | ORAL | 11 refills | Status: AC

## 2022-02-23 NOTE — Progress Notes (Signed)
HIGH-RISK PREGNANCY VISIT Patient name: Madison Mcgrath MRN [id number]  Date of birth: [birth year] Chief Complaint:   Routine Prenatal Visit  History of Present Illness:   Madison Mcgrath is a 32 y.o. G23P1011 female at [redacted]w[redacted]d with an Estimated Date of Delivery: 08/02/22 being seen today for ongoing management of a high-risk pregnancy complicated by:  -Chronic HTN -morbid obesity -hypothryoidism -bipolar d/o   Today she reports no complaints.   Contractions: Not present. Vag. Bleeding: None.  Movement: Present. denies leaking of fluid.      01/26/2022    9:50 AM 03/15/2021   10:34 AM 10/14/2020   11:04 AM 07/29/2020   10:24 AM 07/08/2020    1:54 PM  Depression screen PHQ 2/9  Decreased Interest 0 0 0 0 0  Down, Depressed, Hopeless 0 0 0 1 0  PHQ - 2 Score 0 0 0 1 0  Altered sleeping 0 0     Tired, decreased energy 0 3     Change in appetite 0 3     Feeling bad or failure about yourself  0 0     Trouble concentrating 0 2     Moving slowly or fidgety/restless 0 0     Suicidal thoughts 0 0     PHQ-9 Score 0 8        Current Outpatient Medications  Medication Instructions   aspirin 162 mg, Oral, Daily   labetalol (NORMODYNE) 200 mg, Oral, 2 times daily   lamoTRIgine (LAMICTAL) 300 mg, Oral, Daily, Take two tablets daily   levothyroxine (SYNTHROID) 25 mcg, Oral, Daily before breakfast   Prenatal Vit-Fe Fumarate-FA (MULTIVITAMIN-PRENATAL) 27-0.8 MG TABS tablet TAKE EVERY TABLET BY MOUTH EVERY MORNING   terconazole (TERAZOL 7) 0.4 % vaginal cream 1 applicator, Vaginal, Daily at bedtime     Review of Systems:   Pertinent items are noted in HPI Denies abnormal vaginal discharge w/ itching/odor/irritation, headaches, visual changes, shortness of breath, chest pain, abdominal pain, severe nausea/vomiting, or problems with urination or bowel movements unless otherwise stated above. Pertinent History Reviewed:  Reviewed past medical,surgical, social, obstetrical and family history.  Reviewed  problem list, medications and allergies. Physical Assessment:   Vitals:   02/23/22 1049 02/23/22 1053  BP: (!) 142/89 (!) 142/86  Pulse: 92 92  Weight: (!) 340 lb 12.8 oz (154.6 kg)   Body mass index is 56.71 kg/m.           Physical Examination:   General appearance: alert, well appearing, and in no distress  Mental status: normal mood, behavior, speech, dress, motor activity, and thought processes  Skin: warm & dry   Extremities:      Cardiovascular: normal heart rate noted  Respiratory: normal respiratory effort, no distress  Abdomen: gravid, soft, non-tender  Pelvic: Cervical exam deferred         Fetal Status:     Movement: Present    Fetal Surveillance Testing today: bedside US- FHT 150bpm   Chaperone: N/A    No results found for this or any previous visit (from the past 24 hour(s)).   Assessment & Plan:  High-risk pregnancy: G3P1011 at [redacted]w[redacted]d with an Estimated Date of Delivery: 08/02/22   1) Chronic HTN -increase to Labetalol 300mg  bid -continue checking at home  2) Hypothyroidism -recheck today  -acid reflux Rx for pepcid sent in  -morbid obesity -Bipolar d/o- on lamictal   Meds: No orders of the defined types were placed in this encounter.   Labs/procedures today: AFP,  TSH today  Treatment Plan:  continue q 4wks visits  Reviewed: Preterm labor symptoms and general obstetric precautions including but not limited to vaginal bleeding, contractions, leaking of fluid and fetal movement were reviewed in detail with the patient.  All questions were answered. Pt has home bp cuff.   Follow-up: Return for as scheduled for [place] on 2/23 with HROB .   Future Appointments  Date Time Provider [place]  02/23/2022 11:30 AM Janyth Pupa, DO [facility]  03/18/2022  9:15 AM [facility] [place] [facility] None  03/18/2022 10:10 AM Florian Buff, MD [facility]    Orders Placed This Encounter  Procedures   AFP, Serum, Open Spina Bifida   TSH     Janyth Pupa, DO Attending [place], [facility] for [employer], [place]

## 2022-02-28 ENCOUNTER — Encounter: Payer: Self-pay | Admitting: Women's Health

## 2022-03-02 ENCOUNTER — Encounter: Payer: Self-pay | Admitting: Women's Health

## 2022-03-07 ENCOUNTER — Encounter: Payer: Self-pay | Admitting: *Deleted

## 2022-03-07 ENCOUNTER — Encounter: Payer: Self-pay | Admitting: Women's Health

## 2022-03-15 ENCOUNTER — Encounter: Payer: Self-pay | Admitting: Women's Health

## 2022-03-16 ENCOUNTER — Other Ambulatory Visit: Payer: Self-pay | Admitting: *Deleted

## 2022-03-16 DIAGNOSIS — Z1379 Encounter for other screening for genetic and chromosomal anomalies: Secondary | ICD-10-CM

## 2022-03-16 DIAGNOSIS — Z3A20 20 weeks gestation of pregnancy: Secondary | ICD-10-CM

## 2022-03-16 DIAGNOSIS — E039 Hypothyroidism, unspecified: Secondary | ICD-10-CM

## 2022-03-18 ENCOUNTER — Ambulatory Visit (INDEPENDENT_AMBULATORY_CARE_PROVIDER_SITE_OTHER): Payer: 59

## 2022-03-18 ENCOUNTER — Encounter: Payer: Self-pay | Admitting: Obstetrics & Gynecology

## 2022-03-18 ENCOUNTER — Ambulatory Visit: Payer: 59 | Admitting: Obstetrics & Gynecology

## 2022-03-18 VITALS — BP 128/77 | HR 87 | Wt 337.0 lb

## 2022-03-18 DIAGNOSIS — O0992 Supervision of high risk pregnancy, unspecified, second trimester: Secondary | ICD-10-CM

## 2022-03-18 DIAGNOSIS — Z3A20 20 weeks gestation of pregnancy: Secondary | ICD-10-CM

## 2022-03-18 DIAGNOSIS — I10 Essential (primary) hypertension: Secondary | ICD-10-CM

## 2022-03-18 DIAGNOSIS — O10912 Unspecified pre-existing hypertension complicating pregnancy, second trimester: Secondary | ICD-10-CM

## 2022-03-18 DIAGNOSIS — Z6841 Body Mass Index (BMI) 40.0 and over, adult: Secondary | ICD-10-CM

## 2022-03-18 DIAGNOSIS — Z363 Encounter for antenatal screening for malformations: Secondary | ICD-10-CM

## 2022-03-18 NOTE — Progress Notes (Signed)
[place] [id number] wks,cephalic,anterior placenta gr 0,normal ovaries,cx 3.5 cm,SVP of fluid 5 cm,FHR 140 bpm,EFW 361 g 51%,anatomy complete,no obvious abnormalities

## 2022-03-18 NOTE — Progress Notes (Incomplete)
HIGH-RISK PREGNANCY VISIT Patient name: Madison Mcgrath MRN [id number]  Date of birth: 1990/02/08 Chief Complaint:   Routine Prenatal Visit, High Risk Gestation, and Pregnancy Ultrasound (Want aspirin table instead chewable)  History of Present Illness:   Madison Mcgrath is a 32 y.o. G55P1011 female at 72w3dwith an Estimated Date of Delivery: 08/02/22 being seen today for ongoing management of a high-risk pregnancy complicated by {High Risk OB:23190}.    Today she reports {pregnancy symptoms:25616::"no complaints"}. Contractions: Not present.  .  Movement: Present. {Actions; denies-reports:120008} leaking of fluid.      01/26/2022    9:50 AM 03/15/2021   10:34 AM 10/14/2020   11:04 AM 07/29/2020   10:24 AM 07/08/2020    1:54 PM  Depression screen PHQ 2/9  Decreased Interest 0 0 0 0 0  Down, Depressed, Hopeless 0 0 0 1 0  PHQ - 2 Score 0 0 0 1 0  Altered sleeping 0 0     Tired, decreased energy 0 3     Change in appetite 0 3     Feeling bad or failure about yourself  0 0     Trouble concentrating 0 2     Moving slowly or fidgety/restless 0 0     Suicidal thoughts 0 0     PHQ-9 Score 0 8           01/26/2022    9:51 AM 03/15/2021   10:35 AM 06/18/2020    9:51 AM  GAD 7 : Generalized Anxiety Score  Nervous, Anxious, on Edge 0 1 0  Control/stop worrying 0 0 1  Worry too much - different things 0 2 1  Trouble relaxing 0 0 0  Restless 0 0 0  Easily annoyed or irritable 0 3 1  Afraid - awful might happen 0 2 0  Total GAD 7 Score 0 8 3     Review of Systems:   Pertinent items are noted in HPI Denies abnormal vaginal discharge w/ itching/odor/irritation, headaches, visual changes, shortness of breath, chest pain, abdominal pain, severe nausea/vomiting, or problems with urination or bowel movements unless otherwise stated above. Pertinent History Reviewed:  Reviewed past medical,surgical, social, obstetrical and family history.  Reviewed problem list, medications and allergies. Physical  Assessment:   Vitals:   03/18/22 1009  BP: 128/77  Pulse: 87  Weight: (!) 337 lb (152.9 kg)  Body mass index is 56.08 kg/m.           Physical Examination:   General appearance: {:315021}  Mental status: {:313008}  Skin: warm & dry   Extremities: Edema: None    Cardiovascular: normal heart rate noted  Respiratory: normal respiratory effort, no distress  Abdomen: gravid, soft, non-tender  Pelvic: {Blank single:19197::"Cervical exam performed","Cervical exam deferred"}         Fetal Status:     Movement: Present    Fetal Surveillance Testing today: ***   Chaperone: {Chaperone:19197::"N/A","pt declined","Latisha Cresenzo","Janet Young","Amanda Andrews","Peggy Dones","Angel Neas"}    No results found for this or any previous visit (from the past 24 hour(s)).  Assessment & Plan:  High-risk pregnancy: G3P1011 at 232w3dith an Estimated Date of Delivery: 08/02/22      ICD-10-CM   1. Encounter for supervision of high risk pregnancy in second trimester, antepartum  O09.92     2. Chronic hypertension  I10        1) ***, {stable/unstable:60080}  2) ***, {stable/unstable:60080}  Meds: No orders of the defined types were placed  in this encounter.   Orders: No orders of the defined types were placed in this encounter.    Labs/procedures today: {ob lab/procedures:25214}  Treatment Plan:  ***  Reviewed: {Blank single:19197::"Term","Preterm"} labor symptoms and general obstetric precautions including but not limited to vaginal bleeding, contractions, leaking of fluid and fetal movement were reviewed in detail with the patient.  All questions were answered. {does does not:25387::"Does"} have home bp cuff. Office bp cuff given: {yes/no/default [id number] applicable"}. Check bp {weekly daily:25388::"weekly"}, let us know if consistently {pregnant bp:25389::">140 and/or >90"}.  Follow-up: Return in about 4 weeks (around 04/15/2022) for [place].   No future appointments.  No  orders of the defined types were placed in this encounter.  Florian Buff  Attending Physician for the [facility] 03/18/2022 10:31 AM

## 2022-03-24 ENCOUNTER — Encounter: Payer: Self-pay | Admitting: Radiology

## 2022-03-29 ENCOUNTER — Encounter: Payer: Self-pay | Admitting: Women's Health

## 2022-03-31 ENCOUNTER — Encounter: Payer: Self-pay | Admitting: Women's Health

## 2022-04-05 ENCOUNTER — Other Ambulatory Visit (HOSPITAL_COMMUNITY)
Admission: RE | Admit: 2022-04-05 | Discharge: 2022-04-05 | Disposition: A | Discharge status: Home / Self Care | Payer: 59 | Source: Ambulatory Visit | Attending: Obstetrics & Gynecology | Admitting: Obstetrics & Gynecology

## 2022-04-05 ENCOUNTER — Other Ambulatory Visit (INDEPENDENT_AMBULATORY_CARE_PROVIDER_SITE_OTHER): Payer: 59

## 2022-04-05 VITALS — BP 114/70 | HR 95 | Wt 334.0 lb

## 2022-04-05 DIAGNOSIS — O0992 Supervision of high risk pregnancy, unspecified, second trimester: Secondary | ICD-10-CM

## 2022-04-05 DIAGNOSIS — N939 Abnormal uterine and vaginal bleeding, unspecified: Secondary | ICD-10-CM

## 2022-04-05 DIAGNOSIS — N898 Other specified noninflammatory disorders of vagina: Secondary | ICD-10-CM

## 2022-04-05 NOTE — Progress Notes (Signed)
   NURSE VISIT- VAGINITIS  SUBJECTIVE:  Madison Mcgrath is a 32 y.o. G3P1011 [redacted]w[redacted]d pregnantfemale here for a vaginal swab for vaginitis screening.  She reports the following symptoms:light spotting, discharge described as milky, odor, and vulvar itching for 3 days. Denies abnormal vaginal bleeding, significant pelvic pain, fever, or UTI symptoms.  OBJECTIVE:  There were no vitals taken for this visit.  Appears well, in no apparent distress  ASSESSMENT: Vaginal swab for vaginitis screening  PLAN: Self-collected vaginal probe for Gonorrhea, Chlamydia, Trichomonas, Bacterial Vaginosis, Yeast sent to lab Treatment: to be determined once results are received Follow-up as needed if symptoms persist/worsen, or new symptoms develop  Alice Rieger  04/05/2022 10:13 AM

## 2022-04-06 LAB — CERVICOVAGINAL ANCILLARY ONLY
Bacterial Vaginitis (gardnerella): NEGATIVE
Candida Glabrata: NEGATIVE
Candida Vaginitis: NEGATIVE
Chlamydia: NEGATIVE
Comment: NEGATIVE
Comment: NEGATIVE
Comment: NEGATIVE
Comment: NEGATIVE
Comment: NEGATIVE
Comment: NORMAL
Neisseria Gonorrhea: NEGATIVE
Trichomonas: NEGATIVE

## 2022-04-15 ENCOUNTER — Encounter: Payer: Self-pay | Admitting: Obstetrics & Gynecology

## 2022-04-15 ENCOUNTER — Ambulatory Visit (INDEPENDENT_AMBULATORY_CARE_PROVIDER_SITE_OTHER): Payer: 59 | Admitting: Obstetrics & Gynecology

## 2022-04-15 VITALS — BP 130/84 | HR 92 | Wt 335.6 lb

## 2022-04-15 DIAGNOSIS — Z3A24 24 weeks gestation of pregnancy: Secondary | ICD-10-CM

## 2022-04-15 DIAGNOSIS — F319 Bipolar disorder, unspecified: Secondary | ICD-10-CM

## 2022-04-15 DIAGNOSIS — I10 Essential (primary) hypertension: Secondary | ICD-10-CM

## 2022-04-15 DIAGNOSIS — E039 Hypothyroidism, unspecified: Secondary | ICD-10-CM

## 2022-04-15 DIAGNOSIS — O0992 Supervision of high risk pregnancy, unspecified, second trimester: Secondary | ICD-10-CM

## 2022-04-15 IMAGING — US US OB COMP LESS 14 WK
1 series · 14 of 28 positions shown · non-contrast
Comparison: None.

CLINICAL DATA: Cramping and vaginal bleeding. Positive pregnancy
test.

EXAM:
OBSTETRIC <14 WK US AND TRANSVAGINAL OB US
TECHNIQUE: Both transabdominal and transvaginal ultrasound examinations were
performed for complete evaluation of the gestation as well as the
maternal uterus, adnexal regions, and pelvic cul-de-sac.
Transvaginal technique was performed to assess early pregnancy.

[Series 1: us ob comp less 14 wks · 74 acquisitions, 14 frames shown]
[im 3/74]
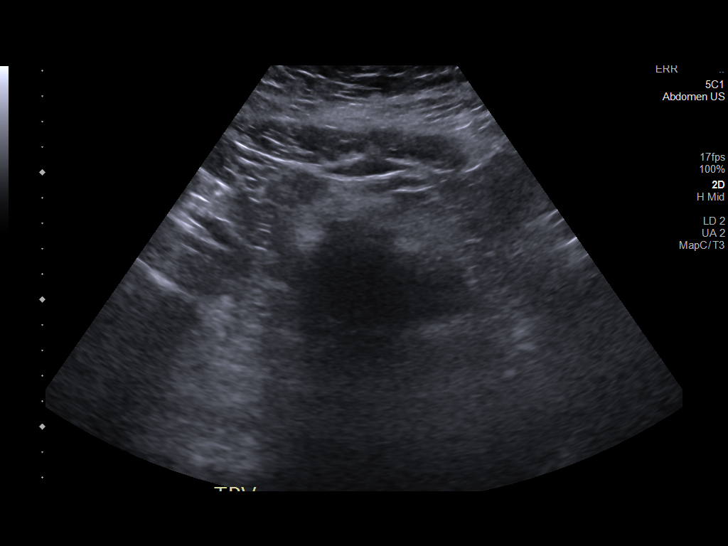
[im 9/74]
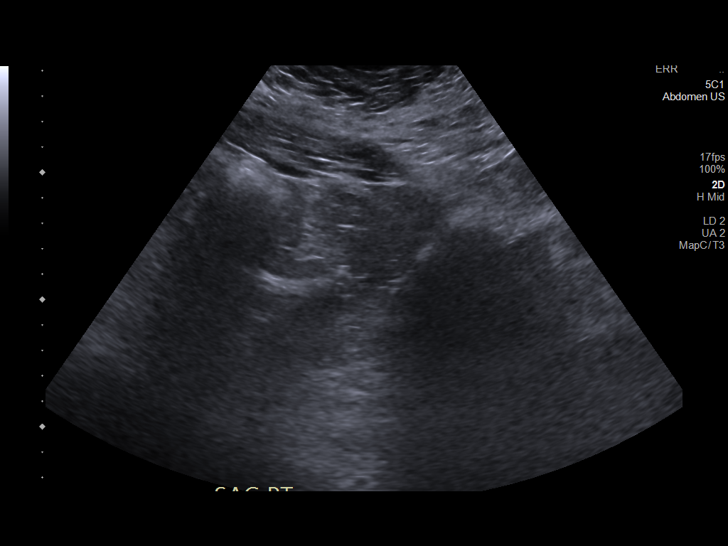
[im 14/74]
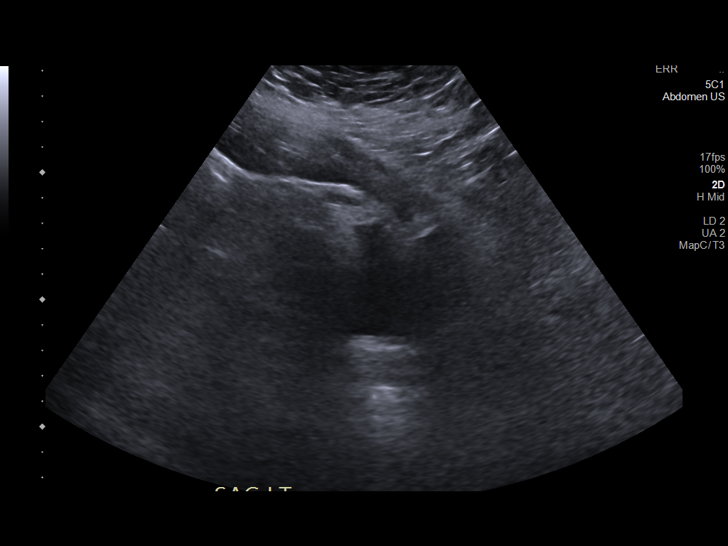
[im 19/74]
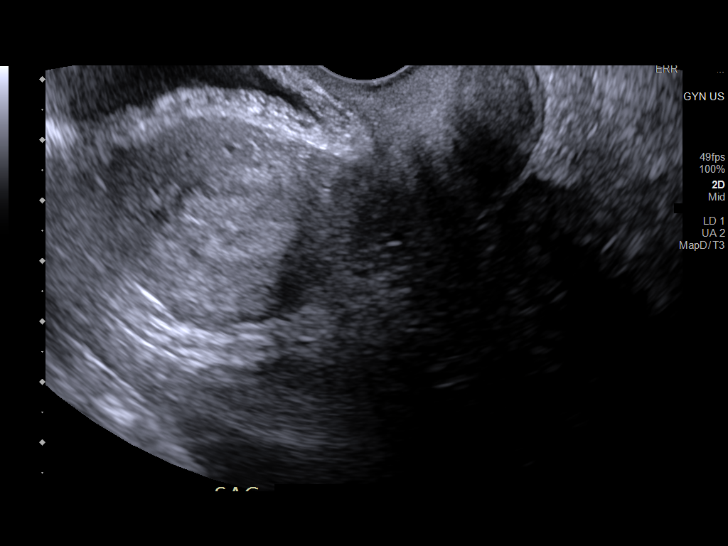
[im 25/74]
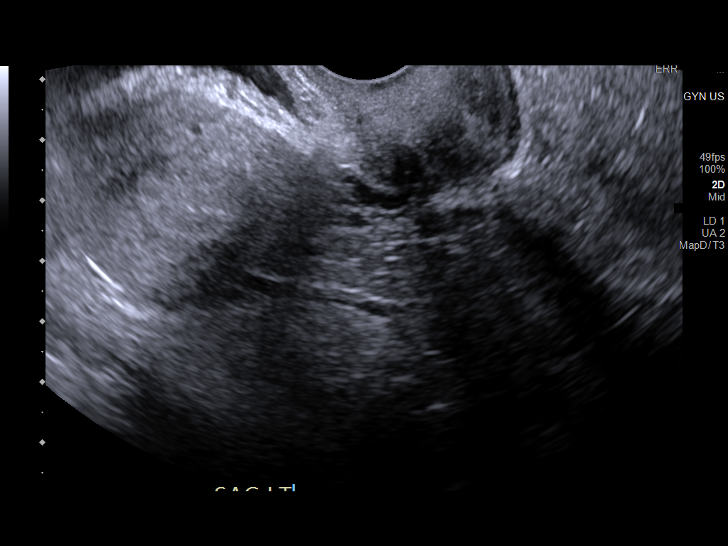
[im 30/74]
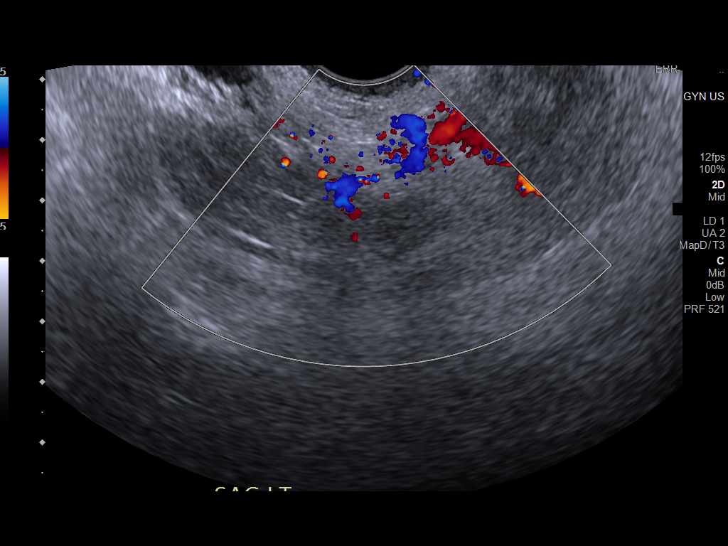
[im 36/74]
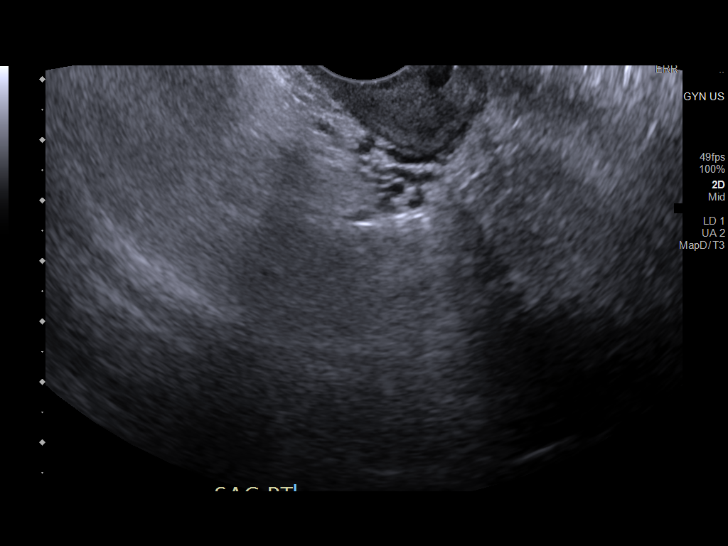
[im 41/74]
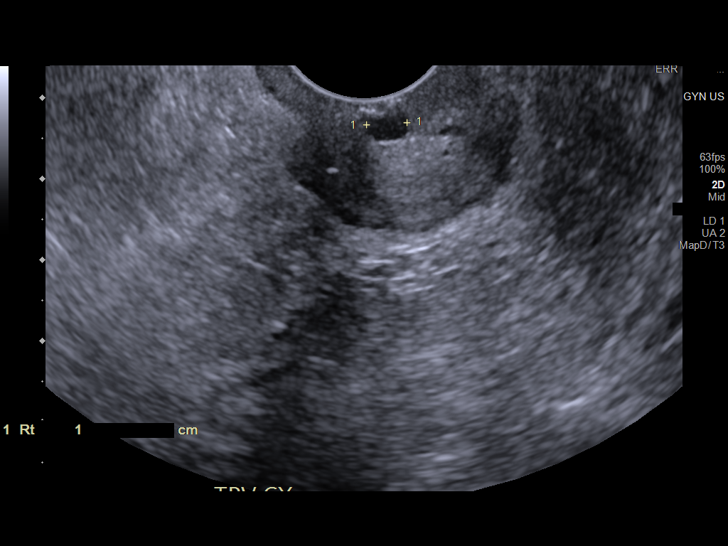
[im 46/74]
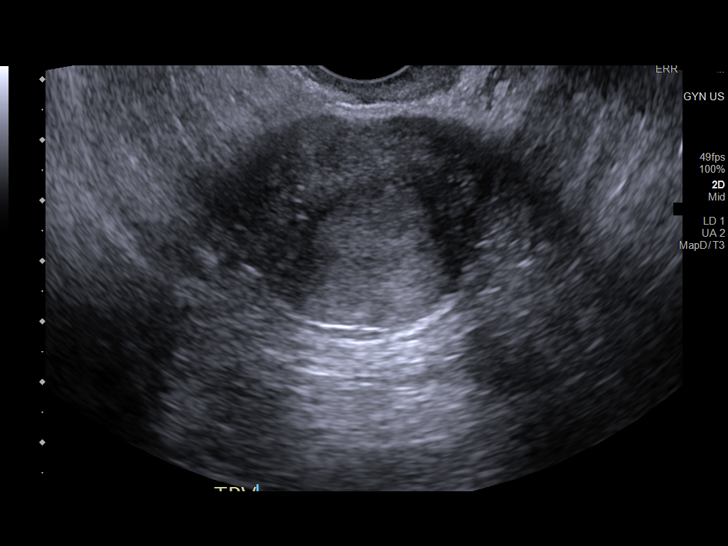
[im 52/74]
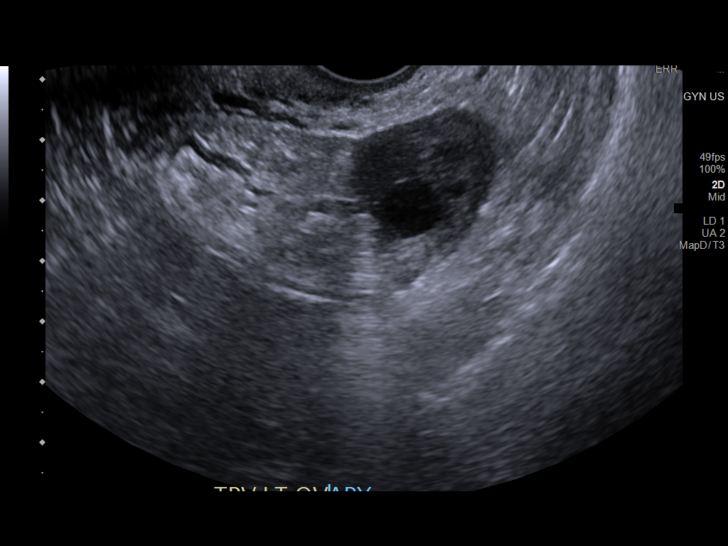
[im 57/74]
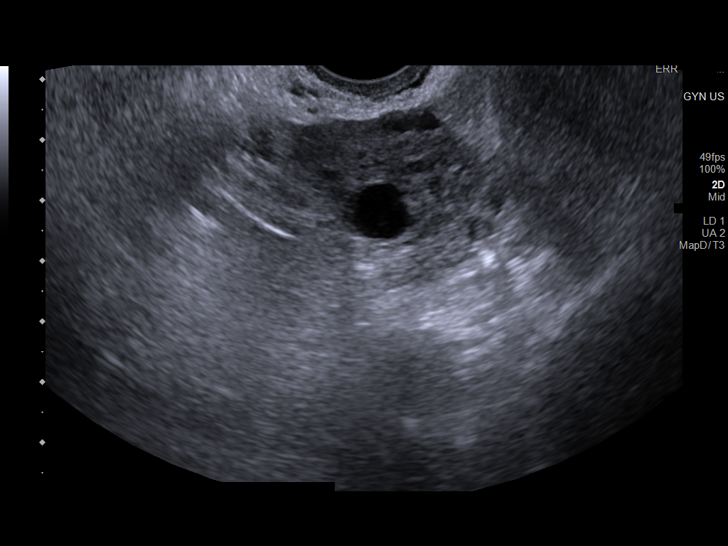
[im 63/74]
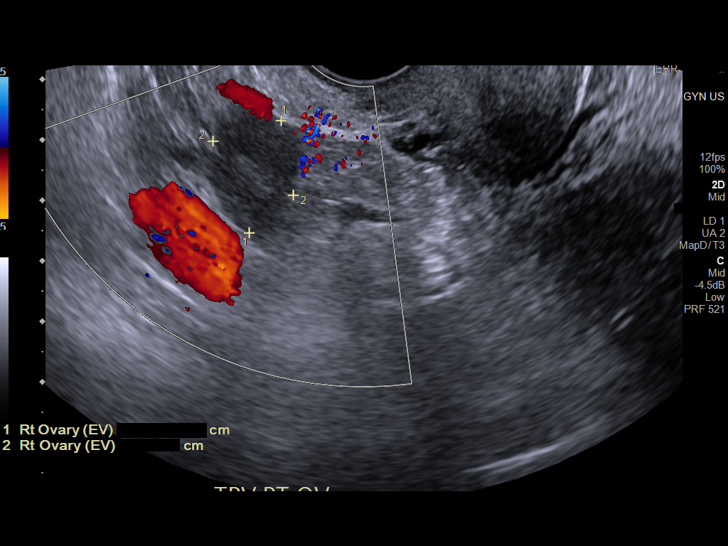
[im 68/74]
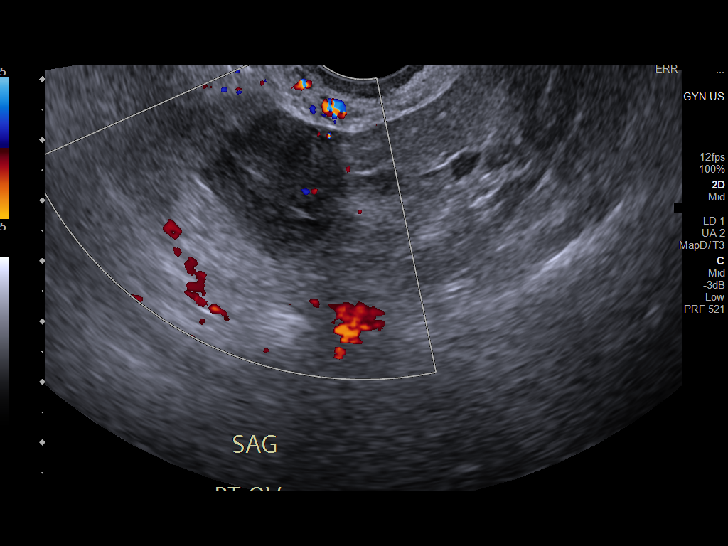
[im 74/74]
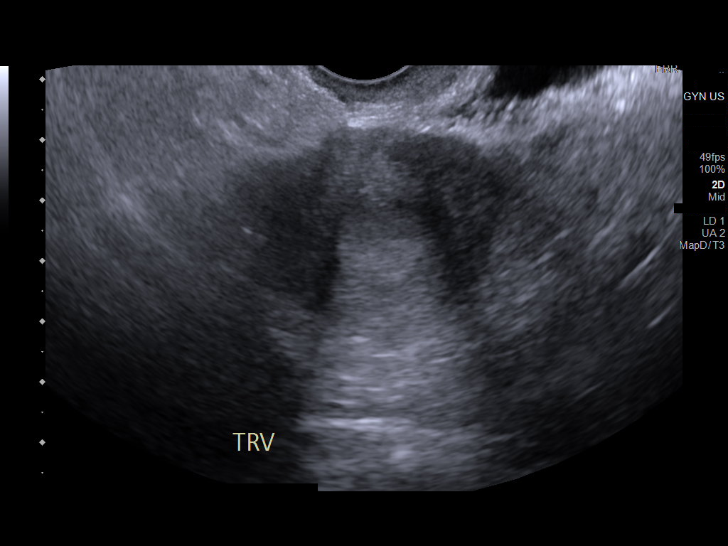

[14 of 28 positions shown; findings below may reference images not displayed]

FINDINGS: Intrauterine gestational sac: None

Yolk sac:  N/A

Embryo:  N/A

Cardiac Activity: N/A

Heart Rate: N/A bpm

Subchorionic hemorrhage:  N/A

Maternal uterus/adnexae: The endometrium measures a maximum of 12
mm. Small nabothian cysts are noted at the cervix. The ovaries are
normal. No adnexal mass. No free pelvic fluid collections.
IMPRESSION: 1. No intrauterine gestational sac is identified.
2. The ovaries appear normal.  No adnexal mass.

## 2022-04-15 IMAGING — US US OB TRANSVAGINAL
1 series · 14 of 28 positions shown · non-contrast
Comparison: None.

CLINICAL DATA: Cramping and vaginal bleeding. Positive pregnancy
test.

EXAM:
OBSTETRIC <14 WK US AND TRANSVAGINAL OB US
TECHNIQUE: Both transabdominal and transvaginal ultrasound examinations were
performed for complete evaluation of the gestation as well as the
maternal uterus, adnexal regions, and pelvic cul-de-sac.
Transvaginal technique was performed to assess early pregnancy.

[Series 1: us ob comp less 14 wks · 74 acquisitions, 14 frames shown]
[im 3/74]
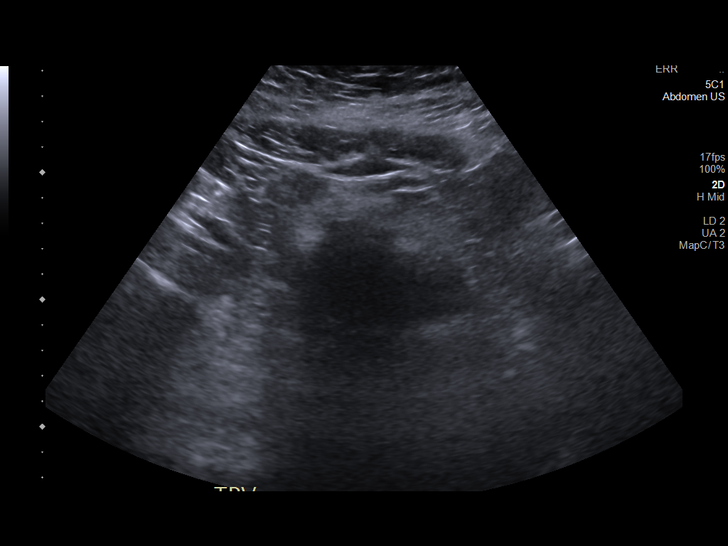
[im 9/74]
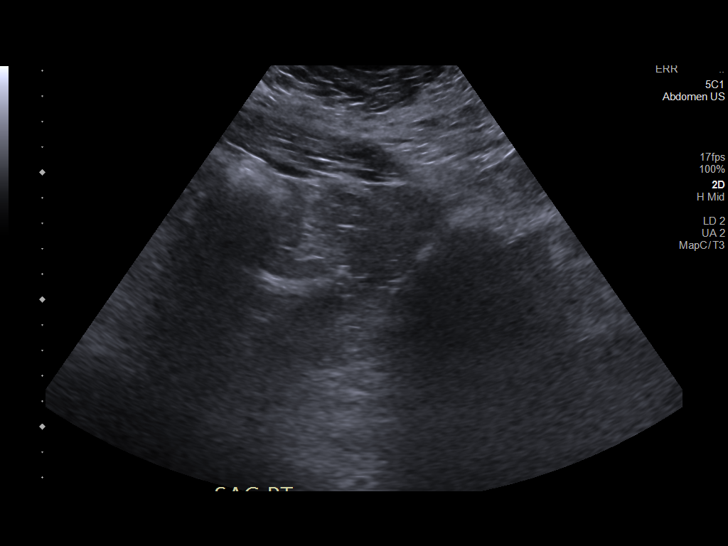
[im 14/74]
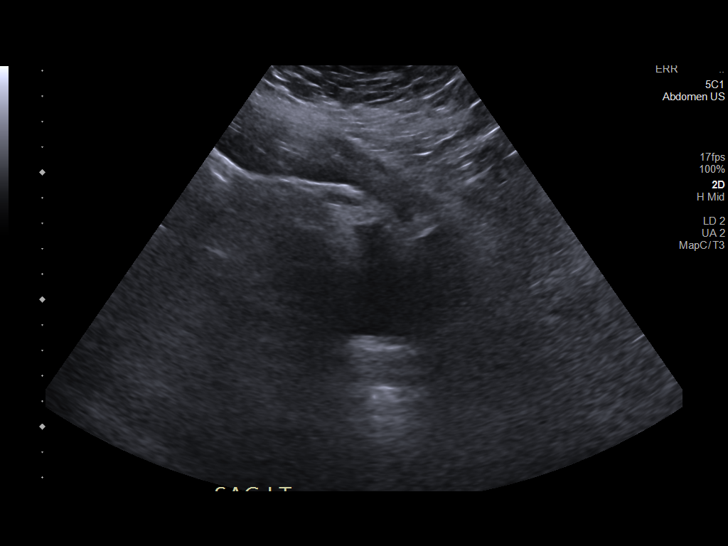
[im 19/74]
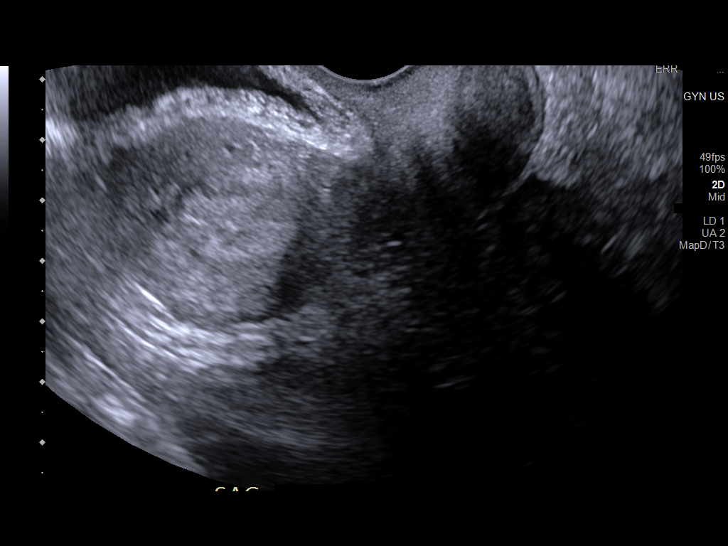
[im 25/74]
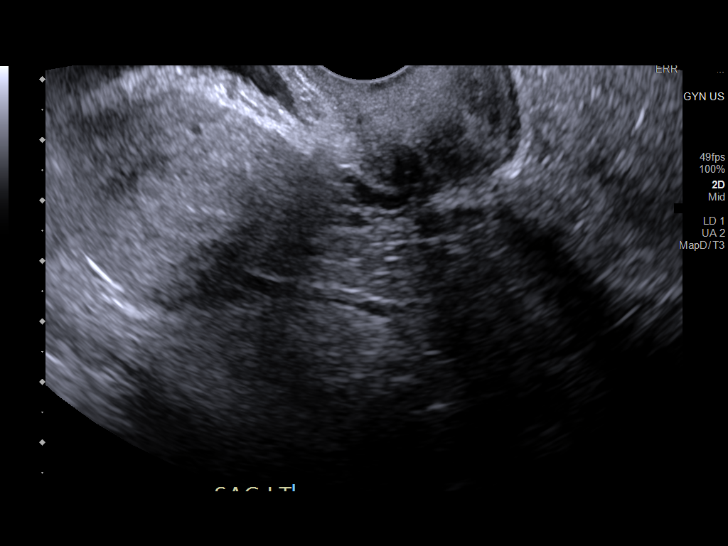
[im 30/74]
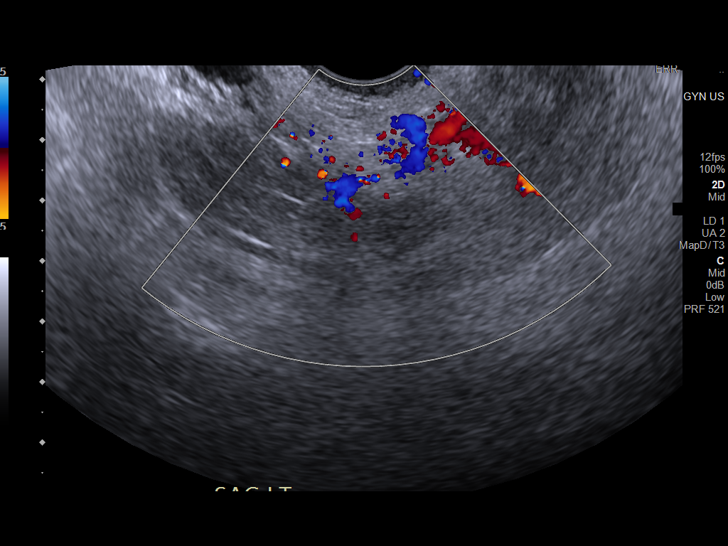
[im 36/74]
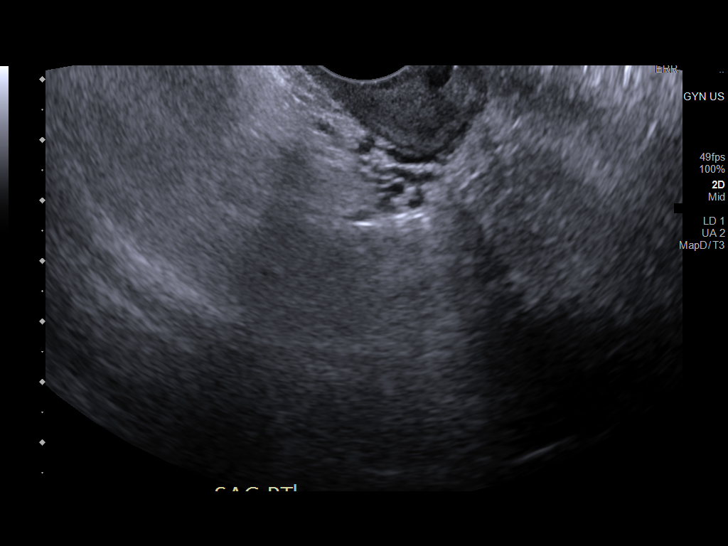
[im 41/74]
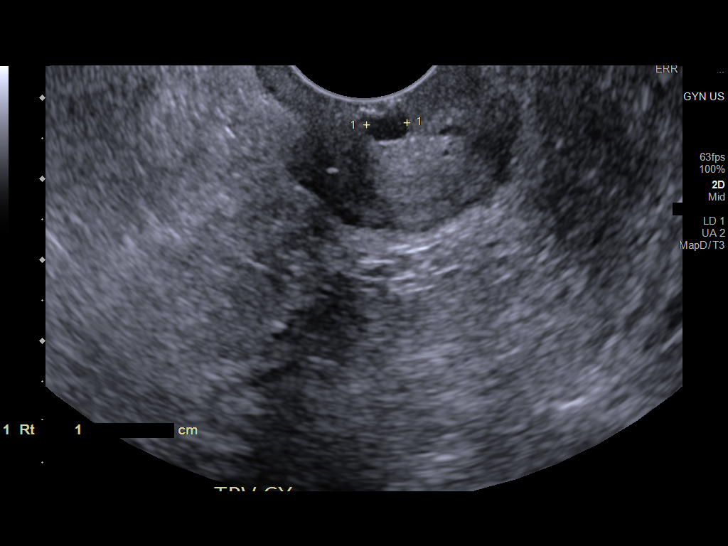
[im 46/74]
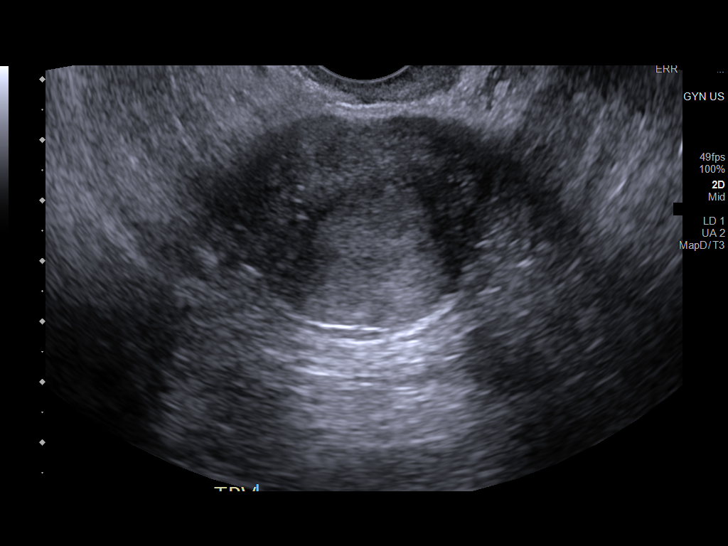
[im 52/74]
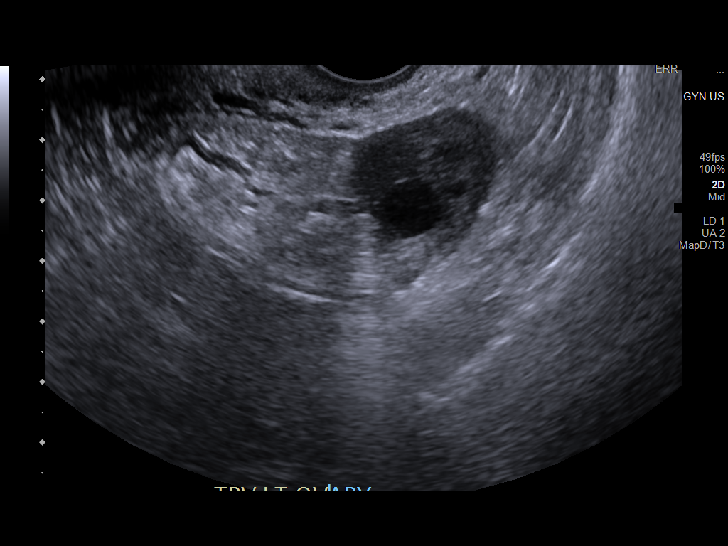
[im 57/74]
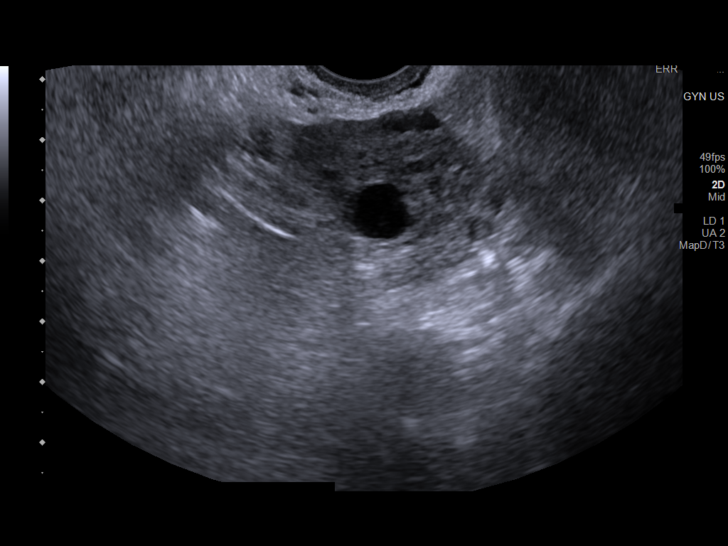
[im 63/74]
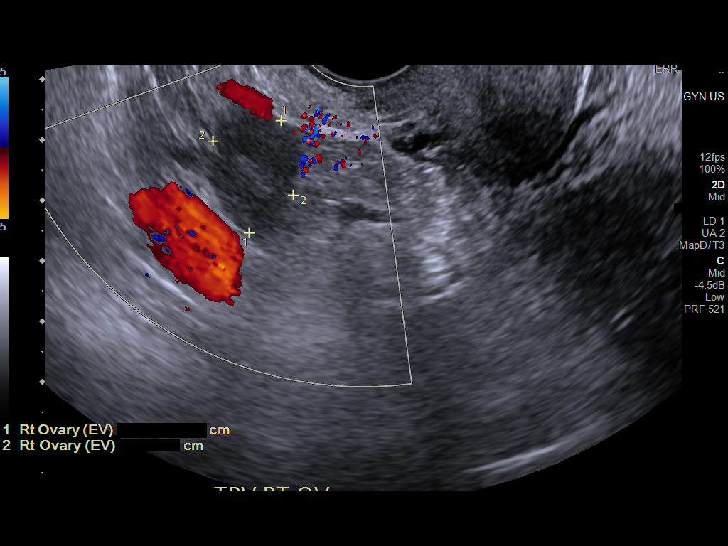
[im 68/74]
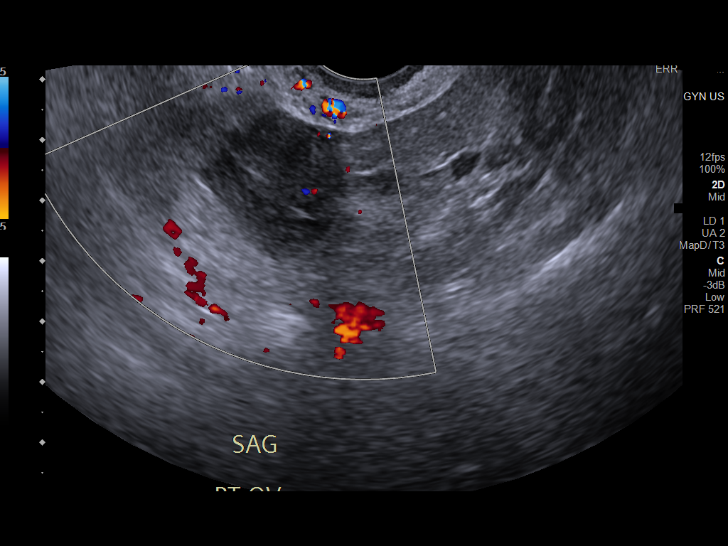
[im 74/74]
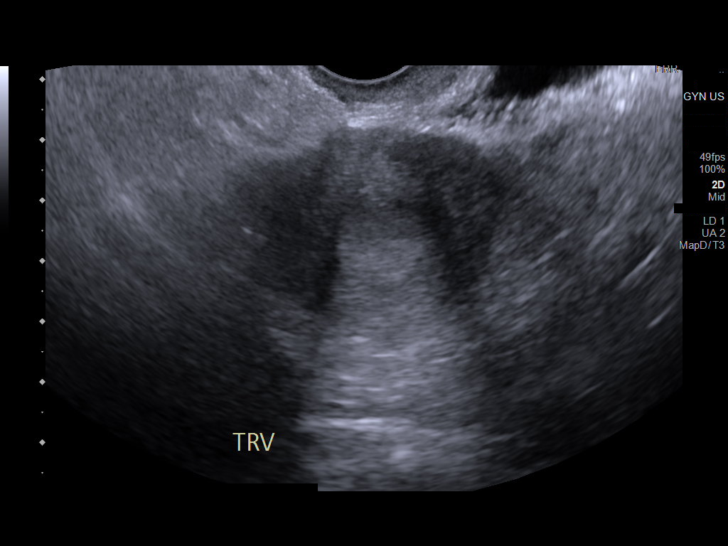

[14 of 28 positions shown; findings below may reference images not displayed]

FINDINGS: Intrauterine gestational sac: None

Yolk sac:  N/A

Embryo:  N/A

Cardiac Activity: N/A

Heart Rate: N/A bpm

Subchorionic hemorrhage:  N/A

Maternal uterus/adnexae: The endometrium measures a maximum of 12
mm. Small nabothian cysts are noted at the cervix. The ovaries are
normal. No adnexal mass. No free pelvic fluid collections.
IMPRESSION: 1. No intrauterine gestational sac is identified.
2. The ovaries appear normal.  No adnexal mass.

## 2022-04-15 MED ORDER — ASPIRIN 81 MG PO TBEC: 81.0000 mg | DELAYED_RELEASE_TABLET | Freq: Every day | ORAL | 4 refills | Status: AC

## 2022-04-15 NOTE — Progress Notes (Signed)
HIGH-RISK PREGNANCY VISIT Patient name: Madison Mcgrath MRN [id number]  Date of birth: Jun 15, 1990 Chief Complaint:   Routine Prenatal Visit  History of Present Illness:   Madison Mcgrath is a 32 y.o. G24P1011 female at [redacted]w[redacted]d with an Estimated Date of Delivery: 08/02/22 being seen today for ongoing management of a high-risk pregnancy complicated by: -chronic HTN- on Labetalol 300mg  bid -Hypothyroidism -Bipolar/ADHD -Morbid obesity.    Today she reports no complaints.   Contractions: Not present. Vag. Bleeding: None.  Movement: Present. denies leaking of fluid.      01/26/2022    9:50 AM 03/15/2021   10:34 AM 10/14/2020   11:04 AM 07/29/2020   10:24 AM 07/08/2020    1:54 PM  Depression screen PHQ 2/9  Decreased Interest 0 0 0 0 0  Down, Depressed, Hopeless 0 0 0 1 0  PHQ - 2 Score 0 0 0 1 0  Altered sleeping 0 0     Tired, decreased energy 0 3     Change in appetite 0 3     Feeling bad or failure about yourself  0 0     Trouble concentrating 0 2     Moving slowly or fidgety/restless 0 0     Suicidal thoughts 0 0     PHQ-9 Score 0 8        Current Outpatient Medications  Medication Instructions   aspirin EC 81 mg, Oral, Daily, Swallow whole.   famotidine (PEPCID) 20 mg, Oral, 2 times daily   labetalol (NORMODYNE) 300 mg, Oral, 2 times daily   lamoTRIgine (LAMICTAL) 300 mg, Oral, Daily, Take two tablets daily   levothyroxine (SYNTHROID) 25 mcg, Oral, Daily before breakfast   Prenatal Vit-Fe Fumarate-FA (MULTIVITAMIN-PRENATAL) 27-0.8 MG TABS tablet TAKE EVERY TABLET BY MOUTH EVERY MORNING   terconazole (TERAZOL 7) 0.4 % vaginal cream 1 applicator, Vaginal, Daily at bedtime     Review of Systems:   Pertinent items are noted in HPI Denies abnormal vaginal discharge w/ itching/odor/irritation, headaches, visual changes, shortness of breath, chest pain, abdominal pain, severe nausea/vomiting, or problems with urination or bowel movements unless otherwise stated above. Pertinent History  Reviewed:  Reviewed past medical,surgical, social, obstetrical and family history.  Reviewed problem list, medications and allergies. Physical Assessment:   Vitals:   04/15/22 0839  BP: 130/84  Pulse: 92  Weight: (!) 335 lb 9.6 oz (152.2 kg)  Body mass index is 55.85 kg/m.           Physical Examination:   General appearance: alert, well appearing, and in no distress  Mental status: normal mood, behavior, speech, dress, motor activity, and thought processes  Skin: warm & dry   Extremities:      Cardiovascular: normal heart rate noted  Respiratory: normal respiratory effort, no distress  Abdomen: gravid, soft, non-tender  Pelvic: Cervical exam deferred         Fetal Status: Fetal Heart Rate (bpm): 130   Movement: Present    Fetal Surveillance Testing today: doppler   Chaperone: N/A    No results found for this or any previous visit (from the past 24 hour(s)).   Assessment & Plan:  High-risk pregnancy: G3P1011 at [redacted]w[redacted]d with an Estimated Date of Delivery: 08/02/22   1) Chronic HTN -doing well with current meds -plan to growth every 4wks starting @ 28wks -antepartum testing @ 32wks  2) Hypothyroidism -continue current meds, will recheck next visit  3) Bipolar d/o -no change with current med  -obesity  Meds:  Meds ordered this encounter  Medications   aspirin EC 81 MG tablet    Sig: Take 1 tablet (81 mg total) by mouth daily. Swallow whole.    Dispense:  90 tablet    Refill:  4    Labs/procedures today: none  Treatment Plan:  as outlined above, lab work and [place] next visit  Reviewed: Preterm labor symptoms and general obstetric precautions including but not limited to vaginal bleeding, contractions, leaking of fluid and fetal movement were reviewed in detail with the patient.  All questions were answered. Pt has home bp cuff. Check bp weekly, let us know if >140/90.   Follow-up: Return in about 4 weeks (around 05/13/2022) for PN2, HROB visit and growth every 4 wks  and @ 32wk twice weekly testing (BPP/NST).   Future Appointments  Date Time Provider [place]  05/17/2022  8:30 AM [facility]  05/17/2022  9:15 AM [facility] [place] [facility] None  05/17/2022 10:10 AM Florian Buff, MD [facility]  06/06/2022  9:30 AM [facility] NURSE [facility]  06/09/2022  9:15 AM [facility] [place] [facility] None  06/09/2022 10:10 AM Janyth Pupa, DO [facility]  06/13/2022 10:10 AM [facility] NURSE [facility]  06/16/2022 10:00 AM [facility] [place] [facility] None  06/16/2022 11:10 AM Florian Buff, MD [facility]  06/23/2022 10:00 AM [facility] [place] [facility] None  06/23/2022 10:50 AM Janyth Pupa, DO [facility]  06/27/2022 10:10 AM [facility] NURSE [facility]  06/30/2022 10:00 AM [facility] [place] [facility] None  06/30/2022 10:50 AM Florian Buff, MD [facility]  07/04/2022 10:10 AM [facility] NURSE [facility]  07/07/2022 10:00 AM [facility] [place] [facility] None  07/11/2022 10:10 AM [facility] NURSE [facility]  07/14/2022 10:00 AM [facility] [place] [facility] None  07/18/2022 10:10 AM [facility] NURSE [facility]  07/21/2022 10:00 AM [facility] [place] [facility] None  07/25/2022 10:10 AM [facility] NURSE [facility]  07/26/2022 10:10 AM [facility] NURSE [facility]  07/29/2022 10:00 AM [facility] [place] [facility] None    No orders of the defined types were placed in this encounter.   Janyth Pupa, DO Attending [place], [facility] for [employer], [place]

## 2022-05-10 ENCOUNTER — Encounter: Payer: Self-pay | Admitting: Women's Health

## 2022-05-17 ENCOUNTER — Encounter: Payer: 59 | Admitting: Obstetrics & Gynecology

## 2022-05-17 ENCOUNTER — Other Ambulatory Visit: Payer: 59

## 2022-05-18 ENCOUNTER — Other Ambulatory Visit: Payer: 59

## 2022-05-18 ENCOUNTER — Encounter: Payer: 59 | Admitting: Obstetrics and Gynecology

## 2022-05-19 ENCOUNTER — Other Ambulatory Visit: Payer: 59

## 2022-05-19 DIAGNOSIS — O0992 Supervision of high risk pregnancy, unspecified, second trimester: Secondary | ICD-10-CM

## 2022-05-19 DIAGNOSIS — Z3A29 29 weeks gestation of pregnancy: Secondary | ICD-10-CM

## 2022-05-19 DIAGNOSIS — Z131 Encounter for screening for diabetes mellitus: Secondary | ICD-10-CM

## 2022-05-19 DIAGNOSIS — E039 Hypothyroidism, unspecified: Secondary | ICD-10-CM

## 2022-05-20 ENCOUNTER — Other Ambulatory Visit: Payer: Self-pay | Admitting: Obstetrics & Gynecology

## 2022-05-20 DIAGNOSIS — O24419 Gestational diabetes mellitus in pregnancy, unspecified control: Secondary | ICD-10-CM | POA: Insufficient documentation

## 2022-05-20 DIAGNOSIS — E039 Hypothyroidism, unspecified: Secondary | ICD-10-CM

## 2022-05-20 DIAGNOSIS — O24415 Gestational diabetes mellitus in pregnancy, controlled by oral hypoglycemic drugs: Secondary | ICD-10-CM

## 2022-05-20 LAB — CBC
Hematocrit: 39.9 % (ref 34.0–46.6)
Hemoglobin: 12.9 g/dL (ref 11.1–15.9)
MCH: 25.6 pg — ABNORMAL LOW (ref 26.6–33.0)
MCHC: 32.3 g/dL (ref 31.5–35.7)
MCV: 79 fL (ref 79–97)
Platelets: 252 10*3/uL (ref 150–450)
RBC: 5.04 x10E6/uL (ref 3.77–5.28)
RDW: 14.2 % (ref 11.7–15.4)
WBC: 10.3 10*3/uL (ref 3.4–10.8)

## 2022-05-20 LAB — GLUCOSE TOLERANCE, 2 HOURS W/ 1HR
Glucose, 1 hour: 249 mg/dL — ABNORMAL HIGH (ref 70–179)
Glucose, 2 hour: 228 mg/dL — ABNORMAL HIGH (ref 70–152)
Glucose, Fasting: 128 mg/dL — ABNORMAL HIGH (ref 70–91)

## 2022-05-20 LAB — ANTIBODY SCREEN: Antibody Screen: NEGATIVE

## 2022-05-20 LAB — HIV ANTIBODY (ROUTINE TESTING W REFLEX): HIV Screen 4th Generation wRfx: NONREACTIVE

## 2022-05-20 LAB — TSH: TSH: 5.04 u[IU]/mL — ABNORMAL HIGH (ref 0.450–4.500)

## 2022-05-20 LAB — RPR: RPR Ser Ql: NONREACTIVE

## 2022-05-20 MED ORDER — ACCU-CHEK GUIDE VI STRP
ORAL_STRIP | 12 refills | Status: DC
Start: 2022-05-20 — End: 2022-05-24

## 2022-05-20 MED ORDER — METFORMIN HCL 500 MG PO TABS
500.0000 mg | ORAL_TABLET | Freq: Two times a day (BID) | ORAL | 11 refills | Status: DC
Start: 2022-05-20 — End: 2022-07-28

## 2022-05-20 MED ORDER — LEVOTHYROXINE SODIUM 50 MCG PO TABS
50.0000 ug | ORAL_TABLET | Freq: Every day | ORAL | 4 refills | Status: AC
Start: 2022-05-20 — End: 2022-08-18

## 2022-05-20 MED ORDER — ACCU-CHEK SOFTCLIX LANCETS MISC
12 refills | Status: DC
Start: 2022-05-20 — End: 2022-05-24

## 2022-05-20 NOTE — Progress Notes (Signed)
Called patient to review abnormal labs. Hypothyroidism- she has been out of her meds for about a week at most.  She does note that in prior pregnancy, her dose was higher.  []  will increase to and plan to increase in 4 wks Diabetes- pt had her old glucometer.  Will send in supplies []  will also start metformin 500mg  BID   Pt has follow up appointment with Dr. Despina Hidden May 3  Myna Hidalgo, DO Attending Obstetrician & Gynecologist, [facility] for [facility], [facility]

## 2022-05-23 ENCOUNTER — Other Ambulatory Visit: Payer: Self-pay | Admitting: *Deleted

## 2022-05-23 ENCOUNTER — Other Ambulatory Visit: Payer: Self-pay | Admitting: Obstetrics & Gynecology

## 2022-05-23 DIAGNOSIS — O10912 Unspecified pre-existing hypertension complicating pregnancy, second trimester: Secondary | ICD-10-CM

## 2022-05-23 DIAGNOSIS — O24415 Gestational diabetes mellitus in pregnancy, controlled by oral hypoglycemic drugs: Secondary | ICD-10-CM

## 2022-05-23 DIAGNOSIS — E039 Hypothyroidism, unspecified: Secondary | ICD-10-CM

## 2022-05-23 DIAGNOSIS — O24419 Gestational diabetes mellitus in pregnancy, unspecified control: Secondary | ICD-10-CM

## 2022-05-24 ENCOUNTER — Other Ambulatory Visit: Payer: Self-pay | Admitting: *Deleted

## 2022-05-24 DIAGNOSIS — O24415 Gestational diabetes mellitus in pregnancy, controlled by oral hypoglycemic drugs: Secondary | ICD-10-CM

## 2022-05-24 MED ORDER — ONETOUCH VERIO VI STRP
ORAL_STRIP | 12 refills | Status: DC
Start: 2022-05-24 — End: 2022-05-25

## 2022-05-24 MED ORDER — ONETOUCH DELICA PLUS LANCET33G MISC
1.0000 | Freq: Four times a day (QID) | 6 refills | Status: DC
Start: 2022-05-24 — End: 2022-05-25

## 2022-05-25 ENCOUNTER — Other Ambulatory Visit: Payer: Self-pay | Admitting: *Deleted

## 2022-05-25 DIAGNOSIS — O24415 Gestational diabetes mellitus in pregnancy, controlled by oral hypoglycemic drugs: Secondary | ICD-10-CM

## 2022-05-25 MED ORDER — ACCU-CHEK GUIDE VI STRP
ORAL_STRIP | 12 refills | Status: DC
Start: 2022-05-25 — End: 2022-07-28

## 2022-05-25 MED ORDER — ACCU-CHEK GUIDE ME W/DEVICE KIT: 1.0000 | PACK | Freq: Four times a day (QID) | 0 refills | Status: DC

## 2022-05-25 MED ORDER — ACCU-CHEK SOFTCLIX LANCETS MISC
12 refills | Status: DC
Start: 2022-05-25 — End: 2022-07-28

## 2022-05-27 ENCOUNTER — Encounter: Payer: Self-pay | Admitting: Obstetrics and Gynecology

## 2022-05-27 ENCOUNTER — Ambulatory Visit (INDEPENDENT_AMBULATORY_CARE_PROVIDER_SITE_OTHER): Payer: 59

## 2022-05-27 ENCOUNTER — Ambulatory Visit (INDEPENDENT_AMBULATORY_CARE_PROVIDER_SITE_OTHER): Payer: 59 | Admitting: Obstetrics and Gynecology

## 2022-05-27 VITALS — BP 125/76 | HR 89 | Wt 333.0 lb

## 2022-05-27 DIAGNOSIS — Z23 Encounter for immunization: Secondary | ICD-10-CM | POA: Diagnosis not present

## 2022-05-27 DIAGNOSIS — E039 Hypothyroidism, unspecified: Secondary | ICD-10-CM

## 2022-05-27 DIAGNOSIS — O10912 Unspecified pre-existing hypertension complicating pregnancy, second trimester: Secondary | ICD-10-CM

## 2022-05-27 DIAGNOSIS — Z3A30 30 weeks gestation of pregnancy: Secondary | ICD-10-CM

## 2022-05-27 DIAGNOSIS — O0992 Supervision of high risk pregnancy, unspecified, second trimester: Secondary | ICD-10-CM

## 2022-05-27 DIAGNOSIS — O99283 Endocrine, nutritional and metabolic diseases complicating pregnancy, third trimester: Secondary | ICD-10-CM | POA: Diagnosis not present

## 2022-05-27 DIAGNOSIS — O24419 Gestational diabetes mellitus in pregnancy, unspecified control: Secondary | ICD-10-CM | POA: Diagnosis not present

## 2022-05-27 DIAGNOSIS — O24415 Gestational diabetes mellitus in pregnancy, controlled by oral hypoglycemic drugs: Secondary | ICD-10-CM

## 2022-05-27 DIAGNOSIS — I10 Essential (primary) hypertension: Secondary | ICD-10-CM

## 2022-05-27 DIAGNOSIS — O10913 Unspecified pre-existing hypertension complicating pregnancy, third trimester: Secondary | ICD-10-CM

## 2022-05-27 DIAGNOSIS — O99282 Endocrine, nutritional and metabolic diseases complicating pregnancy, second trimester: Secondary | ICD-10-CM

## 2022-05-27 NOTE — Progress Notes (Signed)
Subjective:  Madison Mcgrath is a 32 y.o. G3P1011 at [redacted]w[redacted]d being seen today for ongoing prenatal care.  She is currently monitored for the following issues for this high-risk pregnancy and has Attention deficit hyperactivity disorder (ADHD); Bipolar disorder (HCC); Morbid obesity with BMI of 50.0-59.9, adult (HCC); Hypothyroid; Chronic hypertension; History of gestational diabetes; Chronic migraine without aura without status migrainosus, not intractable; Chronic low back pain; Encounter for supervision of high risk pregnancy in second trimester, antepartum; and Gestational diabetes mellitus (GDM) on their problem list.  Patient reports  general discomforts of pregnancy .  Contractions: Not present. Vag. Bleeding: None.  Movement: Present. Denies leaking of fluid.   The following portions of the patient's history were reviewed and updated as appropriate: allergies, current medications, past family history, past medical history, past social history, past surgical history and problem list. Problem list updated.  Objective:   Vitals:   05/27/22 1016  BP: 125/76  Pulse: 89  Weight: (!) 333 lb (151 kg)    Fetal Status:     Movement: Present     General:  Alert, oriented and cooperative. Patient is in no acute distress.  Skin: Skin is warm and dry. No rash noted.   Cardiovascular: Normal heart rate noted  Respiratory: Normal respiratory effort, no problems with respiration noted  Abdomen: Soft, gravid, appropriate for gestational age. Pain/Pressure: Absent     Pelvic:  Cervical exam deferred        Extremities: Normal range of motion.  Edema: None  Mental Status: Normal mood and affect. Normal behavior. Normal judgment and thought content.   Urinalysis:      Assessment and Plan:  Pregnancy: G3P1011 at [redacted]w[redacted]d  1. Encounter for supervision of high risk pregnancy in second trimester, antepartum Stable - Tdap vaccine greater than or equal to 7yo IM  2. [redacted] weeks gestation of pregnancy  - Tdap  vaccine greater than or equal to 7yo IM  3. Chronic hypertension BP stable Continue with current management Growth scan today  4. Hypothyroidism, unspecified type Has not been taking Synthroid, reviewed importance of taking medication. Pt states will start  5. Gestational diabetes mellitus (GDM) in third trimester controlled on oral hypoglycemic drug Did not bring in CBG log Reports CBG's are improving with start of medication Pt provide CBG log sheet and instructed to bring to all OB appt Growth scan today Continue with Metformin  Preterm labor symptoms and general obstetric precautions including but not limited to vaginal bleeding, contractions, leaking of fluid and fetal movement were reviewed in detail with the patient. Please refer to After Visit Summary for other counseling recommendations.  Return in about 2 weeks (around 06/10/2022) for OB visit, face to face, MD only.   Hermina Staggers, MD

## 2022-05-27 NOTE — Progress Notes (Signed)
[place] 30+3 wks,cephalic,anterior placenta gr 1,CX 3.9 cm,AFI 18 cm,FHR 124 bpm,EFW 1528 g 30%

## 2022-05-30 ENCOUNTER — Encounter: Payer: Self-pay | Admitting: Women's Health

## 2022-06-06 ENCOUNTER — Other Ambulatory Visit: Payer: 59

## 2022-06-09 ENCOUNTER — Other Ambulatory Visit: Payer: 59

## 2022-06-09 ENCOUNTER — Encounter: Payer: 59 | Admitting: Obstetrics & Gynecology

## 2022-06-10 ENCOUNTER — Ambulatory Visit (INDEPENDENT_AMBULATORY_CARE_PROVIDER_SITE_OTHER): Payer: 59 | Admitting: *Deleted

## 2022-06-10 VITALS — BP 120/72 | HR 97 | Wt 329.6 lb

## 2022-06-10 DIAGNOSIS — O0993 Supervision of high risk pregnancy, unspecified, third trimester: Secondary | ICD-10-CM

## 2022-06-10 DIAGNOSIS — O0992 Supervision of high risk pregnancy, unspecified, second trimester: Secondary | ICD-10-CM

## 2022-06-10 DIAGNOSIS — I10 Essential (primary) hypertension: Secondary | ICD-10-CM

## 2022-06-10 DIAGNOSIS — Z3A32 32 weeks gestation of pregnancy: Secondary | ICD-10-CM | POA: Diagnosis not present

## 2022-06-10 DIAGNOSIS — O24415 Gestational diabetes mellitus in pregnancy, controlled by oral hypoglycemic drugs: Secondary | ICD-10-CM

## 2022-06-10 LAB — POCT URINALYSIS DIPSTICK OB
Blood, UA: NEGATIVE
Glucose, UA: NEGATIVE
Nitrite, UA: NEGATIVE

## 2022-06-10 NOTE — Progress Notes (Signed)
   NURSE VISIT- NST  SUBJECTIVE:  Madison Mcgrath is a 32 y.o. G37P1011 female at [redacted]w[redacted]d, here for a NST for pregnancy complicated by [facility] and Diabetes: A2DM} on Metformin.  She reports active fetal movement, contractions: none, vaginal bleeding: none, membranes: intact.   OBJECTIVE:  BP 120/72   Pulse 97   Wt (!) 329 lb 9.6 oz (149.5 kg)   BMI 54.85 kg/m   Appears well, no apparent distress  Results for orders placed or performed in visit on 06/10/22 (from the past 24 hour(s))  POC Urinalysis Dipstick OB   Collection Time: 06/10/22 11:13 AM  Result Value Ref Range   Color, UA     Clarity, UA     Glucose, UA Negative Negative   Bilirubin, UA     Ketones, UA trace    Spec Grav, UA     Blood, UA neg    pH, UA     POC,PROTEIN,UA Small (1+) Negative, Trace, Small (1+), Moderate (2+), Large (3+), 4+   Urobilinogen, UA     Nitrite, UA neg    Leukocytes, UA Trace (A) Negative   Appearance     Odor      NST: FHR baseline 130 bpm, Variability: moderate, Accelerations:present, Decelerations:  Absent= Cat 1/reactive Toco: none   ASSESSMENT: G3P1011 at [redacted]w[redacted]d with CHTN and 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 Charlotta Newton   Recommendations: keep next appointment as scheduled    Jobe Marker  06/10/2022 12:30 PM

## 2022-06-13 ENCOUNTER — Other Ambulatory Visit: Payer: 59

## 2022-06-13 ENCOUNTER — Other Ambulatory Visit: Payer: Self-pay | Admitting: Obstetrics & Gynecology

## 2022-06-13 DIAGNOSIS — O10913 Unspecified pre-existing hypertension complicating pregnancy, third trimester: Secondary | ICD-10-CM

## 2022-06-13 DIAGNOSIS — O24419 Gestational diabetes mellitus in pregnancy, unspecified control: Secondary | ICD-10-CM

## 2022-06-14 ENCOUNTER — Ambulatory Visit (INDEPENDENT_AMBULATORY_CARE_PROVIDER_SITE_OTHER): Payer: 59 | Admitting: Obstetrics & Gynecology

## 2022-06-14 ENCOUNTER — Ambulatory Visit (INDEPENDENT_AMBULATORY_CARE_PROVIDER_SITE_OTHER): Payer: 59

## 2022-06-14 ENCOUNTER — Encounter: Payer: Self-pay | Admitting: Obstetrics & Gynecology

## 2022-06-14 VITALS — BP 124/77 | HR 92 | Wt 331.2 lb

## 2022-06-14 DIAGNOSIS — O0992 Supervision of high risk pregnancy, unspecified, second trimester: Secondary | ICD-10-CM

## 2022-06-14 DIAGNOSIS — O0993 Supervision of high risk pregnancy, unspecified, third trimester: Secondary | ICD-10-CM

## 2022-06-14 DIAGNOSIS — O24419 Gestational diabetes mellitus in pregnancy, unspecified control: Secondary | ICD-10-CM | POA: Diagnosis not present

## 2022-06-14 DIAGNOSIS — O24415 Gestational diabetes mellitus in pregnancy, controlled by oral hypoglycemic drugs: Secondary | ICD-10-CM

## 2022-06-14 DIAGNOSIS — Z3A33 33 weeks gestation of pregnancy: Secondary | ICD-10-CM | POA: Diagnosis not present

## 2022-06-14 DIAGNOSIS — O10913 Unspecified pre-existing hypertension complicating pregnancy, third trimester: Secondary | ICD-10-CM

## 2022-06-14 DIAGNOSIS — I10 Essential (primary) hypertension: Secondary | ICD-10-CM

## 2022-06-14 NOTE — Progress Notes (Signed)
[place] 33 wks,cephalic,BPP 8/8,anterior placenta gr 2,AFI 18 cm,FHR 142 bpm,RI .68,.66,.67,.69=81%

## 2022-06-14 NOTE — Progress Notes (Signed)
HIGH-RISK PREGNANCY VISIT Patient name: Madison Mcgrath MRN [id number]  Date of birth: February 04, 1990 Chief Complaint:   Routine Prenatal Visit  History of Present Illness:   Madison Mcgrath is a 32 y.o. G65P1011 female at [redacted]w[redacted]d with an Estimated Date of Delivery: 08/02/22 being seen today for ongoing management of a high-risk pregnancy complicated by chronic hypertension currently on labetalol 300 BID and diabetes mellitus A2DM currently on metformin 500 am ^1000 pm  .    Today she reports no complaints. Contractions: Not present. Vag. Bleeding: None.  Movement: Present. denies leaking of fluid.      01/26/2022    9:50 AM 03/15/2021   10:34 AM 10/14/2020   11:04 AM 07/29/2020   10:24 AM 07/08/2020    1:54 PM  Depression screen PHQ 2/9  Decreased Interest 0 0 0 0 0  Down, Depressed, Hopeless 0 0 0 1 0  PHQ - 2 Score 0 0 0 1 0  Altered sleeping 0 0     Tired, decreased energy 0 3     Change in appetite 0 3     Feeling bad or failure about yourself  0 0     Trouble concentrating 0 2     Moving slowly or fidgety/restless 0 0     Suicidal thoughts 0 0     PHQ-9 Score 0 8           01/26/2022    9:51 AM 03/15/2021   10:35 AM 06/18/2020    9:51 AM  GAD 7 : Generalized Anxiety Score  Nervous, Anxious, on Edge 0 1 0  Control/stop worrying 0 0 1  Worry too much - different things 0 2 1  Trouble relaxing 0 0 0  Restless 0 0 0  Easily annoyed or irritable 0 3 1  Afraid - awful might happen 0 2 0  Total GAD 7 Score 0 8 3     Review of Systems:   Pertinent items are noted in HPI Denies abnormal vaginal discharge w/ itching/odor/irritation, headaches, visual changes, shortness of breath, chest pain, abdominal pain, severe nausea/vomiting, or problems with urination or bowel movements unless otherwise stated above. Pertinent History Reviewed:  Reviewed past medical,surgical, social, obstetrical and family history.  Reviewed problem list, medications and allergies. Physical Assessment:   Vitals:    06/14/22 1604  BP: 124/77  Pulse: 92  Weight: (!) 331 lb 3.2 oz (150.2 kg)  Body mass index is 55.11 kg/m.           Physical Examination:   General appearance: alert, well appearing, and in no distress  Mental status: alert, oriented to person, place, and time  Skin: warm & dry   Extremities:      Cardiovascular: normal heart rate noted  Respiratory: normal respiratory effort, no distress  Abdomen: gravid, soft, non-tender  Pelvic: Cervical exam deferred         Fetal Status:     Movement: Present    Fetal Surveillance Testing today: BPP 8/8 UAD 82%   Chaperone: N/A    No results found for this or any previous visit (from the past 24 hour(s)).  Assessment & Plan:  High-risk pregnancy: G3P1011 at [redacted]w[redacted]d with an Estimated Date of Delivery: 08/02/22      ICD-10-CM   1. Supervision of high risk pregnancy in second trimester  O09.92     2. Gestational diabetes mellitus A2 metformin 500 am 1000 pm  O24.415     3. Chronic hypertension  on labetalol 300 BID  I10        Meds: No orders of the defined types were placed in this encounter.   Orders: No orders of the defined types were placed in this encounter.    Labs/procedures today: U/S  Treatment Plan:  twice weekly surveillance  Reviewed: Preterm labor symptoms and general obstetric precautions including but not limited to vaginal bleeding, contractions, leaking of fluid and fetal movement were reviewed in detail with the patient.  All questions were answered. Does have home bp cuff. Office bp cuff given: yes. Check bp twice daily, let us know if consistently >150 and/or >95.  Follow-up: No follow-ups on file.   Future Appointments  Date Time Provider Department Center  06/17/2022 10:30 AM [facility] NURSE [facility]  06/21/2022 10:50 AM [facility] NURSE [facility]  06/21/2022 11:10 AM Lazaro Arms, MD [facility]  06/24/2022 10:50 AM [facility] NURSE [facility]  06/28/2022  3:00 PM [facility] [place]  [facility] None  06/28/2022  3:50 PM Cheral Marker, CNM [facility]  07/01/2022 10:30 AM [facility] NURSE [facility]  07/05/2022 11:30 AM [facility] NURSE [facility]  07/05/2022 11:50 AM Lazaro Arms, MD [facility]  07/08/2022 10:30 AM [facility] N[facility]SE [facility]  07/12/2022  2:15 PM [facility] [place] [facility] None  07/12/2022  3:10 PM Lazaro Arms, MD [facility]  07/15/2022 10:30 AM [facility] NURSE [facility]  07/19/2022  8:30 AM [facility] [place] [facility] None  07/19/2022  9:30 AM Hermina Staggers, MD [facility]  07/22/2022 10:10 AM [facility] N[facility]SE [facility]  07/26/2022 10:00 AM [facility] [place] [facility] None  07/26/2022 10:50 AM Lazaro Arms, MD [facility]  07/29/2022  9:30 AM [facility] NURSE [facility]    No orders of the defined types were placed in this encounter.  Lazaro Arms  Attending Physician for the Center for [facility] 06/14/2022 4:32 PM

## 2022-06-16 ENCOUNTER — Encounter: Payer: 59 | Admitting: Obstetrics & Gynecology

## 2022-06-16 ENCOUNTER — Other Ambulatory Visit: Payer: 59

## 2022-06-17 ENCOUNTER — Ambulatory Visit (INDEPENDENT_AMBULATORY_CARE_PROVIDER_SITE_OTHER): Payer: 59 | Admitting: *Deleted

## 2022-06-17 VITALS — BP 133/84 | HR 97

## 2022-06-17 DIAGNOSIS — Z3A33 33 weeks gestation of pregnancy: Secondary | ICD-10-CM

## 2022-06-17 DIAGNOSIS — O24415 Gestational diabetes mellitus in pregnancy, controlled by oral hypoglycemic drugs: Secondary | ICD-10-CM

## 2022-06-17 DIAGNOSIS — I10 Essential (primary) hypertension: Secondary | ICD-10-CM

## 2022-06-17 DIAGNOSIS — O0993 Supervision of high risk pregnancy, unspecified, third trimester: Secondary | ICD-10-CM

## 2022-06-17 DIAGNOSIS — O0992 Supervision of high risk pregnancy, unspecified, second trimester: Secondary | ICD-10-CM

## 2022-06-17 NOTE — Progress Notes (Signed)
   NURSE VISIT- NST  SUBJECTIVE:  Madison Mcgrath is a 32 y.o. G61P1011 female at [redacted]w[redacted]d, here for a NST for pregnancy complicated by [facility] and Diabetes: A2DM} on Metformin. She reports active fetal movement, contractions: none, vaginal bleeding: none, membranes: intact.   OBJECTIVE:  BP 133/84   Pulse 97   Appears well, no apparent distress  No results found for this or any previous visit (from the past 24 hour(s)).  NST: FHR baseline 130 bpm, Variability: moderate, Accelerations:present, Decelerations:  Absent= Cat 1/reactive Toco: none   ASSESSMENT: G3P1011 at [redacted]w[redacted]d with CHTN and Diabetes: A2DM} on Metformin NST reactive  PLAN: EFM strip reviewed by Philipp Deputy, CNM   Recommendations: keep next appointment as scheduled    Jobe Marker  06/17/2022 10:59 AM

## 2022-06-21 ENCOUNTER — Other Ambulatory Visit: Payer: 59

## 2022-06-21 ENCOUNTER — Encounter: Payer: 59 | Admitting: Obstetrics & Gynecology

## 2022-06-22 ENCOUNTER — Inpatient Hospital Stay (HOSPITAL_COMMUNITY)
Admission: AD | Admit: 2022-06-22 | Discharge: 2022-06-22 | Disposition: A | Discharge status: Home / Self Care | Payer: 59 | Attending: Obstetrics and Gynecology | Admitting: Obstetrics and Gynecology

## 2022-06-22 ENCOUNTER — Encounter (HOSPITAL_COMMUNITY): Payer: Self-pay | Admitting: Obstetrics and Gynecology

## 2022-06-22 DIAGNOSIS — O99891 Other specified diseases and conditions complicating pregnancy: Secondary | ICD-10-CM | POA: Diagnosis not present

## 2022-06-22 DIAGNOSIS — M5459 Other low back pain: Secondary | ICD-10-CM | POA: Diagnosis present

## 2022-06-22 DIAGNOSIS — Z3A34 34 weeks gestation of pregnancy: Secondary | ICD-10-CM

## 2022-06-22 DIAGNOSIS — M549 Dorsalgia, unspecified: Secondary | ICD-10-CM

## 2022-06-22 DIAGNOSIS — O26893 Other specified pregnancy related conditions, third trimester: Secondary | ICD-10-CM | POA: Insufficient documentation

## 2022-06-22 DIAGNOSIS — Z3493 Encounter for supervision of normal pregnancy, unspecified, third trimester: Secondary | ICD-10-CM

## 2022-06-22 LAB — URINALYSIS, ROUTINE W REFLEX MICROSCOPIC
Bacteria, UA: NONE SEEN
Bilirubin Urine: NEGATIVE
Glucose, UA: NEGATIVE mg/dL
Hgb urine dipstick: NEGATIVE
Ketones, ur: 80 mg/dL — AB
Nitrite: NEGATIVE
Protein, ur: 30 mg/dL — AB
Specific Gravity, Urine: 1.023 (ref 1.005–1.030)
pH: 5 (ref 5.0–8.0)

## 2022-06-22 LAB — WET PREP, GENITAL
Clue Cells Wet Prep HPF POC: NONE SEEN
Sperm: NONE SEEN
Trich, Wet Prep: NONE SEEN
WBC, Wet Prep HPF POC: >=10 — AB (ref <10)
Yeast Wet Prep HPF POC: NONE SEEN

## 2022-06-22 LAB — POCT FERN TEST: POCT Fern Test: NEGATIVE

## 2022-06-22 MED ORDER — ACETAMINOPHEN 500 MG PO TABS
1000.0000 mg | ORAL_TABLET | Freq: Once | ORAL | Status: AC
  Administered 2022-06-22: 1000 mg via ORAL
  Filled 2022-06-22: qty 2

## 2022-06-22 MED ORDER — CYCLOBENZAPRINE HCL 5 MG PO TABS
10.0000 mg | ORAL_TABLET | Freq: Once | ORAL | Status: AC
  Administered 2022-06-22: 10 mg via ORAL
  Filled 2022-06-22: qty 2

## 2022-06-22 NOTE — MAU Provider Note (Signed)
History     CSN: [id number]  Arrival date and time: 06/22/22 2016   Event Date/Time   First Provider Initiated Contact with Patient 06/22/22 2146      Chief Complaint  Patient presents with   Rupture of Membranes   Back Pain   Madison Mcgrath , a  32 y.o. G3P1011 at [redacted]w[redacted]d presents to MAU with complaints of SROM and lower back pain. Patient states that she was sitting on the floor and when she stood up she noticed that the floor was damp and her pants "were a little wet." She denies continuing to leak since incident. She also reports that on the way over to MAU she began having some intermittent lower back pain. Rating pain a 5/10, denies attempting to relieve symptoms. She states that pain is worsened with sitting on the stretcher and sitting up makes pain better. She denies vaginal bleeding, abnormal vaginal discharge and urinary symptoms. She endorses positive fetal movement.          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   Thyroid disease    Vaginal Pap smear, abnormal     Past Surgical History:  Procedure Laterality Date   CHOLECYSTECTOMY     LEG SURGERY Left    stick removed from leg   NO PAST SURGERIES      Family History  Problem Relation Age of Onset   Colon cancer Paternal Grandmother    Kidney failure Paternal Grandmother    Cancer Maternal Grandmother    Ovarian cancer Maternal Grandmother    Diabetes Maternal Grandfather    Arthritis Father    High blood pressure Mother    Osteoporosis Mother    Dementia Mother    Diabetes Maternal Uncle    Cleft lip Other    Autism Other     Bipolar disorder Other    Breast cancer Neg Hx     Social History   Tobacco Use   Smoking status: Never   Smokeless tobacco: Never  Vaping Use   Vaping Use: Former  Substance Use Topics   Alcohol use: Not Currently   Drug use: Never    Allergies:  Allergies  Allergen Reactions   Bee Venom Rash    Medications Prior to Admission  Medication Sig Dispense Refill Last Dose   aspirin EC 81 MG tablet Take 1 tablet (81 mg total) by mouth daily. Swallow whole. 90 tablet 4 06/22/2022 at 0900   labetalol (NORMODYNE) 300 MG tablet Take 1 tablet (300 mg total) by mouth 2 (two) times daily. 60 tablet 11 06/22/2022 at 0900   lamoTRIgine (LAMICTAL) 150 MG tablet Take 300 mg by mouth daily. Take two tablets daily   06/22/2022 at 0900   levothyroxine (SYNTHROID) 50 MCG tablet Take 1 tablet (50 mcg total) by mouth daily before breakfast. 90 tablet 4 06/22/2022 at 0900  metFORMIN (GLUCOPHAGE) 500 MG tablet Take 1 tablet (500 mg total) by mouth 2 (two) times daily with a meal. 60 tablet 11 06/22/2022 at 0900   Prenatal Vit-Fe Fumarate-FA (MULTIVITAMIN-PRENATAL) 27-0.8 MG TABS tablet TAKE EVERY TABLET BY MOUTH EVERY MORNING 100 tablet 1 06/22/2022 at 0900   Accu-Chek Softclix Lancets lancets Use as instructed to check blood sugar 4 times daily 100 each 12    Blood Glucose Monitoring Suppl (ACCU-CHEK GUIDE ME) w/Device KIT 1 each by Does not apply route 4 (four) times daily. 1 kit 0    famotidine (PEPCID) 20 MG tablet Take 1 tablet (20 mg total) by mouth 2 (two) times daily. 60 tablet 11    glucose blood (ACCU-CHEK GUIDE) test strip Use as instructed to check blood sugar 4 times daily 50 each 12     Review of Systems  Constitutional:  Negative for chills, fatigue and fever.  Eyes:  Negative for pain and visual disturbance.  Respiratory:  Negative for apnea, shortness of breath and wheezing.   Cardiovascular:  Negative for chest pain and palpitations.  Gastrointestinal:  Negative for abdominal pain,  constipation, diarrhea, nausea and vomiting.  Genitourinary:  Negative for difficulty urinating, dysuria, pelvic pain, vaginal bleeding, vaginal discharge and vaginal pain.  Musculoskeletal:  Positive for back pain.  Neurological:  Negative for seizures, weakness and headaches.  Psychiatric/Behavioral:  Negative for suicidal ideas.    Physical Exam   Blood pressure 132/66, pulse 89, temperature 98.5 F (36.9 C), temperature source Oral, resp. rate 18, height 5\' 5"  (1.651 m), weight (!) 151.3 kg, SpO2 98 %, not currently breastfeeding.  Physical Exam Vitals and nursing note reviewed.  Constitutional:      General: She is not in acute distress.    Appearance: Normal appearance.  HENT:     Head: Normocephalic.  Cardiovascular:     Rate and Rhythm: Normal rate and regular rhythm.  Pulmonary:     Effort: Pulmonary effort is normal.  Abdominal:     Palpations: Abdomen is soft.     Comments: Pregnant   Genitourinary:    Vagina: Vaginal discharge present.     Comments: White clumpy discharge adhered to vaginal wall.  Musculoskeletal:     Cervical back: Normal range of motion.  Skin:    General: Skin is warm and dry.     Capillary Refill: Capillary refill takes 2 to 3 seconds.  Neurological:     Mental Status: She is alert and oriented to person, place, and time.  Psychiatric:        Mood and Affect: Mood normal.    FHT: 135 bpm with moderate variability accels present. No decels noted   Toco: Quiet   Dilation: Fingertip Exam by:: Dorathy Daft, CNM   MAU Course  Procedures Orders Placed This Encounter  Procedures   Wet prep, genital   Urinalysis, Routine w reflex microscopic -Urine, Clean Catch   Fern Test   Results for orders placed or performed during the hospital encounter of 06/22/22 (from the past 24 hour(s))  Urinalysis, Routine w reflex microscopic -Urine, Clean Catch     Status: Abnormal   Collection Time: 06/22/22  9:04 PM  Result Value Ref Range   Color, Urine  YELLOW YELLOW   APPearance HAZY (A) CLEAR   Specific Gravity, Urine 1.023 1.005 - 1.030   pH 5.0 5.0 - 8.0   Glucose, UA NEGATIVE NEGATIVE mg/dL   Hgb urine dipstick NEGATIVE NEGATIVE   Bilirubin Urine NEGATIVE NEGATIVE   Ketones,  ur 80 (A) NEGATIVE mg/dL   Protein, ur 30 (A) NEGATIVE mg/dL   Nitrite NEGATIVE NEGATIVE   Leukocytes,Ua MODERATE (A) NEGATIVE   RBC / HPF 0-5 0 - 5 RBC/hpf   WBC, UA 11-20 0 - 5 WBC/hpf   Bacteria, UA NONE SEEN NONE SEEN   Squamous Epithelial / HPF 0-5 0 - 5 /HPF   Mucus PRESENT   Fern Test     Status: None   Collection Time: 06/22/22  9:36 PM  Result Value Ref Range   POCT Fern Test Negative = intact amniotic membranes      MDM - No pooling noted on exam.  - Fern negative  - Dilation: Fingertip Exam by:: Dorathy Daft, CNM - Low suspicion for PPROM.  - 80 of Ketones in urine, PO hydration ordered.  - PO Tylenol and Flexeril for Back pain.  - Plan for discharge.   Assessment and Plan   1. Intact amniotic membranes during pregnancy in third trimester   2. [redacted] weeks gestation of pregnancy   3. Back pain affecting pregnancy in third trimester    - Reviewed signs and symptoms of labor.  - Worsening signs and return precautions reviewed.  - FHT appropriate for gestational age at time of discharge.  - Patient discharged home in stable condition and may return to MAU as needed.   Claudette Head, MSN CNM  06/22/2022, 9:46 PM

## 2022-06-22 NOTE — MAU Note (Signed)
..  Madison Mcgrath is a 32 y.o. at [redacted]w[redacted]d here in MAU reporting: had a gush of clear fluid, was on her pants and some on the floor. Reports lower back pain that comes and goes that began on the way over here.    Pain score: 4/10 Vitals:   06/22/22 2041  BP: 134/75  Pulse: 86  Resp: 18  Temp: 98.5 F (36.9 C)  SpO2: 100%     FHT:135 Lab orders placed from triage:

## 2022-06-23 ENCOUNTER — Encounter: Payer: 59 | Admitting: Obstetrics & Gynecology

## 2022-06-23 ENCOUNTER — Other Ambulatory Visit: Payer: 59

## 2022-06-23 ENCOUNTER — Encounter: Payer: Self-pay | Admitting: Women's Health

## 2022-06-24 ENCOUNTER — Other Ambulatory Visit: Payer: 59

## 2022-06-27 ENCOUNTER — Other Ambulatory Visit: Payer: 59

## 2022-06-27 ENCOUNTER — Other Ambulatory Visit: Payer: Self-pay | Admitting: Obstetrics & Gynecology

## 2022-06-27 DIAGNOSIS — O24419 Gestational diabetes mellitus in pregnancy, unspecified control: Secondary | ICD-10-CM

## 2022-06-27 DIAGNOSIS — O10913 Unspecified pre-existing hypertension complicating pregnancy, third trimester: Secondary | ICD-10-CM

## 2022-06-28 ENCOUNTER — Ambulatory Visit (INDEPENDENT_AMBULATORY_CARE_PROVIDER_SITE_OTHER): Payer: 59

## 2022-06-28 ENCOUNTER — Ambulatory Visit (INDEPENDENT_AMBULATORY_CARE_PROVIDER_SITE_OTHER): Payer: 59 | Admitting: Women's Health

## 2022-06-28 ENCOUNTER — Encounter: Payer: Self-pay | Admitting: Women's Health

## 2022-06-28 VITALS — BP 123/79 | HR 89 | Wt 325.2 lb

## 2022-06-28 DIAGNOSIS — O10913 Unspecified pre-existing hypertension complicating pregnancy, third trimester: Secondary | ICD-10-CM | POA: Diagnosis not present

## 2022-06-28 DIAGNOSIS — I10 Essential (primary) hypertension: Secondary | ICD-10-CM

## 2022-06-28 DIAGNOSIS — O24419 Gestational diabetes mellitus in pregnancy, unspecified control: Secondary | ICD-10-CM | POA: Diagnosis not present

## 2022-06-28 DIAGNOSIS — Z3A35 35 weeks gestation of pregnancy: Secondary | ICD-10-CM | POA: Diagnosis not present

## 2022-06-28 DIAGNOSIS — O0993 Supervision of high risk pregnancy, unspecified, third trimester: Secondary | ICD-10-CM

## 2022-06-28 DIAGNOSIS — O0992 Supervision of high risk pregnancy, unspecified, second trimester: Secondary | ICD-10-CM

## 2022-06-28 DIAGNOSIS — O24415 Gestational diabetes mellitus in pregnancy, controlled by oral hypoglycemic drugs: Secondary | ICD-10-CM

## 2022-06-28 NOTE — Patient Instructions (Signed)
Madison Mcgrath, thank you for choosing our office today! We appreciate the opportunity to meet your healthcare needs. You may receive a short survey by mail, e-mail, or through MyChart. If you are happy with your care we would appreciate if you could take just a few minutes to complete the survey questions. We read all of your comments and take your feedback very seriously. Thank you again for choosing our office.  [facility] ([address] [place], [place] [address]) Entrance C, located off of [address] Free 24/7 valet parking   CLASSES: Go to [email or web address] to register for classes (childbirth, breastfeeding, waterbirth, infant CPR, daddy bootcamp, etc.)  Call the office ([phone]) or go to [facility] if: You begin to have strong, frequent contractions Your water breaks.  Sometimes it is a big gush of fluid, sometimes it is just a trickle that keeps getting your panties wet or running down your legs You have vaginal bleeding.  It is normal to have a small amount of spotting if your cervix was checked.  You don't feel your baby moving like normal.  If you don't, get you something to eat and drink and lay down and focus on feeling your baby move.   If your baby is still not moving like normal, you should call the office or go to [facility].  Call the office ([phone]) or go to [facility] for these signs of pre-eclampsia: Severe headache that does not go away with Tylenol Visual changes- seeing spots, double, blurred vision Pain under your right breast or upper abdomen that does not go away with Tums or heartburn medicine Nausea and/or vomiting Severe swelling in your hands, feet, and face   Tdap Vaccine It is recommended that you get the Tdap vaccine during the third trimester of EACH pregnancy to help protect your baby from getting pertussis (whooping cough) 27-36 weeks is the BEST time to do this  so that you can pass the protection on to your baby. During pregnancy is better than after pregnancy, but if you are unable to get it during pregnancy it will be offered at the hospital.  You can get this vaccine with us, at the health department, your family doctor, or some local pharmacies Everyone who will be around your baby should also be up-to-date on their vaccines before the baby comes. Adults (who are not pregnant) only need 1 dose of Tdap during adulthood.   [place] Pediatricians/Family Doctors [facility] ([facility]): [address], [phone]           [facility]: [address], [phone]                [facility] ([facility]): [address], [phone] (call to ask if accepting patients) [facility]: [address], [place], [phone]    [place] Pediatricians/Family Doctors [facility] ([facility]): [address], [phone] [facility]: [address], [phone] [facility]: [address], [phone]  [place] [facility] Doctors  [facility] ([facility]): [phone] [facility]: [address], [phone]   [place] Family Doctors [facility]: [address], [phone]  [place] [facility]  [facility]: [address], [phone]  Home Blood Pressure Monitoring for Patients   Your provider has recommended that you check your   blood pressure (BP) at least once a week at home. If you do not have a blood pressure cuff at home, one will be provided for you. Contact your provider if you have not received your monitor within 1 week.   Helpful Tips for Accurate Home Blood Pressure Checks  Don't smoke, exercise, or drink caffeine 30 minutes before checking your BP Use the restroom before checking your BP (a full bladder can raise your  pressure) Relax in a comfortable upright chair Feet on the ground Left arm resting comfortably on a flat surface at the level of your heart Legs uncrossed Back supported Sit quietly and don't talk Place the cuff on your bare arm Adjust snuggly, so that only two fingertips can fit between your skin and the top of the cuff Check 2 readings separated by at least one minute Keep a log of your BP readings For a visual, please reference this diagram: http://ccnc.care/bpdiagram  Provider Name: [facility]     Phone: [phone]  Zone 1: ALL CLEAR  Continue to monitor your symptoms:  BP reading is less than 140 (top number) or less than 90 (bottom number)  No right upper stomach pain No headaches or seeing spots No feeling nauseated or throwing up No swelling in face and hands  Zone 2: CAUTION Call your doctor's office for any of the following:  BP reading is greater than 140 (top number) or greater than 90 (bottom number)  Stomach pain under your ribs in the middle or right side Headaches or seeing spots Feeling nauseated or throwing up Swelling in face and hands  Zone 3: EMERGENCY  Seek immediate medical care if you have any of the following:  BP reading is greater than160 (top number) or greater than 110 (bottom number) Severe headaches not improving with Tylenol Serious difficulty catching your breath Any worsening symptoms from Zone 2  Preterm Labor and Birth Information  The normal length of a pregnancy is 39-41 weeks. Preterm labor is when labor starts before 37 completed weeks of pregnancy. What are the risk factors for preterm labor? Preterm labor is more likely to occur in women who: Have certain infections during pregnancy such as a bladder infection, sexually transmitted infection, or infection inside the uterus (chorioamnionitis). Have a shorter-than-normal cervix. Have gone into preterm labor before. Have had surgery on their cervix. Are younger than age 17  or older than age 35. Are African American. Are pregnant with twins or multiple babies (multiple gestation). Take street drugs or smoke while pregnant. Do not gain enough weight while pregnant. Became pregnant shortly after having been pregnant. What are the symptoms of preterm labor? Symptoms of preterm labor include: Cramps similar to those that can happen during a menstrual period. The cramps may happen with diarrhea. Pain in the abdomen or lower back. Regular uterine contractions that may feel like tightening of the abdomen. A feeling of increased pressure in the pelvis. Increased watery or bloody mucus discharge from the vagina. Water breaking (ruptured amniotic sac). Why is it important to recognize signs of preterm labor? It is important to recognize signs of preterm labor because babies who are born prematurely may not be fully developed. This can put them at an increased risk for: Long-term (chronic) heart and lung problems. Difficulty immediately after birth with regulating body systems, including blood sugar, body temperature, heart rate, and breathing rate. Bleeding in the brain. Cerebral palsy. Learning difficulties. Death. These risks are highest for babies who are born before 34 weeks   of pregnancy. How is preterm labor treated? Treatment depends on the length of your pregnancy, your condition, and the health of your baby. It may involve: Having a stitch (suture) placed in your cervix to prevent your cervix from opening too early (cerclage). Taking or being given medicines, such as: Hormone medicines. These may be given early in pregnancy to help support the pregnancy. Medicine to stop contractions. Medicines to help mature the baby's lungs. These may be prescribed if the risk of delivery is high. Medicines to prevent your baby from developing cerebral palsy. If the labor happens before 34 weeks of pregnancy, you may need to stay in the hospital. What should I do if I  think I am in preterm labor? If you think that you are going into preterm labor, call your health care provider right away. How can I prevent preterm labor in future pregnancies? To increase your chance of having a full-term pregnancy: Do not use any tobacco products, such as cigarettes, chewing tobacco, and e-cigarettes. If you need help quitting, ask your health care provider. Do not use street drugs or medicines that have not been prescribed to you during your pregnancy. Talk with your health care provider before taking any herbal supplements, even if you have been taking them regularly. Make sure you gain a healthy amount of weight during your pregnancy. Watch for infection. If you think that you might have an infection, get it checked right away. Make sure to tell your health care provider if you have gone into preterm labor before. This information is not intended to replace advice given to you by your health care provider. Make sure you discuss any questions you have with your health care provider. Document Revised: 05/04/2018 Document Reviewed: 06/03/2015 Elsevier Patient Education  2020 [employer]   

## 2022-06-28 NOTE — Progress Notes (Signed)
[place] 35 wks,cephalic,BPP 8/8,FHR 135 bpm,anterior placenta gr 2,RI .64,.61,.67,.65=76%,EFW 2478 g 37%

## 2022-06-28 NOTE — Progress Notes (Signed)
HIGH-RISK PREGNANCY VISIT Patient name: Madison Mcgrath MRN [id number]  Date of birth: [birth year] Chief Complaint:   Routine Prenatal Visit and Pregnancy Ultrasound  History of Present Illness:   Madison Mcgrath is a 32 y.o. G26P1011 female at [redacted]w[redacted]d with an Estimated Date of Delivery: 08/02/22 being seen today for ongoing management of a high-risk pregnancy complicated by chronic hypertension currently on labetalol 300mg  BID, diabetes mellitus A2DM currently on metformin 500AM/1000PM , and morbid obesity BMI >=40.    Today she reports  sugars good, two 2hr of 121 . Contractions: Not present.  .  Movement: Present. denies leaking of fluid.      01/26/2022    9:50 AM 03/15/2021   10:34 AM 10/14/2020   11:04 AM 07/29/2020   10:24 AM 07/08/2020    1:54 PM  Depression screen PHQ 2/9  Decreased Interest 0 0 0 0 0  Down, Depressed, Hopeless 0 0 0 1 0  PHQ - 2 Score 0 0 0 1 0  Altered sleeping 0 0     Tired, decreased energy 0 3     Change in appetite 0 3     Feeling bad or failure about yourself  0 0     Trouble concentrating 0 2     Moving slowly or fidgety/restless 0 0     Suicidal thoughts 0 0     PHQ-9 Score 0 8           01/26/2022    9:51 AM 03/15/2021   10:35 AM 06/18/2020    9:51 AM  GAD 7 : Generalized Anxiety Score  Nervous, Anxious, on Edge 0 1 0  Control/stop worrying 0 0 1  Worry too much - different things 0 2 1  Trouble relaxing 0 0 0  Restless 0 0 0  Easily annoyed or irritable 0 3 1  Afraid - awful might happen 0 2 0  Total GAD 7 Score 0 8 3     Review of Systems:   Pertinent items are noted in HPI Denies abnormal vaginal discharge w/ itching/odor/irritation, headaches, visual changes, shortness of breath, chest pain, abdominal pain, severe nausea/vomiting, or problems with urination or bowel movements unless otherwise stated above. Pertinent History Reviewed:  Reviewed past medical,surgical, social, obstetrical and family history.  Reviewed problem list, medications and  allergies. Physical Assessment:   Vitals:   06/28/22 1520  BP: 123/79  Pulse: 89  Weight: (!) 325 lb 3.2 oz (147.5 kg)  Body mass index is 54.12 kg/m.           Physical Examination:   General appearance: alert, well appearing, and in no distress  Mental status: alert, oriented to person, place, and time  Skin: warm & dry   Extremities: Edema: Trace    Cardiovascular: normal heart rate noted  Respiratory: normal respiratory effort, no distress  Abdomen: gravid, soft, non-tender  Pelvic: Cervical exam deferred         Fetal Status: Fetal Heart Rate (bpm): 135 u/s   Movement: Present    Fetal Surveillance Testing today: [place] 35 wks,cephalic,BPP 8/8,FHR 135 bpm,anterior placenta gr 2,RI .64,.61,.67,.65=76%,EFW 2478 g 37%,AFI 11 cm   Chaperone: N/A    No results found for this or any previous visit (from the past 24 hour(s)).  Assessment & Plan:  High-risk pregnancy: G3P1011 at [redacted]w[redacted]d with an Estimated Date of Delivery: 08/02/22   1) CHTN, stable on labetalol 300mg  BID, reviewed pre-e s/s, reasons to seek care  2) A2DM, stable  on metformin 500AM/1000PM, EFW 37% today  3) BMI 54  Meds: No orders of the defined types were placed in this encounter.   Labs/procedures today: U/S  Treatment Plan:  2x/wk testing, deliver 38-39wks  Reviewed: Preterm labor symptoms and general obstetric precautions including but not limited to vaginal bleeding, contractions, leaking of fluid and fetal movement were reviewed in detail with the patient.  All questions were answered. Does have home bp cuff. Office bp cuff given: not applicable. Check bp weekly, let us know if consistently >140 and/or >90.  Follow-up: Return for As scheduled.   Future Appointments  Date Time Provider Department Center  07/01/2022 10:30 AM [facility] NURSE [facility]  07/05/2022 11:30 AM [facility]  07/05/2022 11:50 AM Lazaro Arms, MD [facility]  07/08/2022 10:30 AM [facility]URSE [facility]  07/12/2022  2:15 PM [facility] [place] [facility] None  07/12/2022  3:10 PM Lazaro Arms, MD [facility]  07/15/2022 10:30 AM [facility]  07/19/2022  8:30 AM [facility] [place] [facility] None  07/19/2022  9:10 AM Cheral Marker, CNM [facility]  07/22/2022 10:10 AM [facility]  07/26/2022 10:00 AM [facility] [place] [facility] None  07/26/2022 10:50 AM Lazaro Arms, MD [facility]  07/29/2022  9:30 AM [facility] N[facility]    No orders of the defined types were placed in this encounter.  Cheral Marker CNM, [facility] 06/28/2022 4:11 PM

## 2022-06-30 ENCOUNTER — Encounter: Payer: 59 | Admitting: Obstetrics & Gynecology

## 2022-06-30 ENCOUNTER — Other Ambulatory Visit: Payer: 59

## 2022-07-01 ENCOUNTER — Ambulatory Visit (INDEPENDENT_AMBULATORY_CARE_PROVIDER_SITE_OTHER): Payer: 59 | Admitting: *Deleted

## 2022-07-01 VITALS — BP 114/74 | HR 91 | Wt 328.8 lb

## 2022-07-01 DIAGNOSIS — O24415 Gestational diabetes mellitus in pregnancy, controlled by oral hypoglycemic drugs: Secondary | ICD-10-CM

## 2022-07-01 DIAGNOSIS — O0993 Supervision of high risk pregnancy, unspecified, third trimester: Secondary | ICD-10-CM

## 2022-07-01 DIAGNOSIS — Z3A35 35 weeks gestation of pregnancy: Secondary | ICD-10-CM

## 2022-07-01 DIAGNOSIS — I10 Essential (primary) hypertension: Secondary | ICD-10-CM

## 2022-07-01 DIAGNOSIS — O0992 Supervision of high risk pregnancy, unspecified, second trimester: Secondary | ICD-10-CM

## 2022-07-01 NOTE — Progress Notes (Signed)
   NURSE VISIT- NST  SUBJECTIVE:  Madison Mcgrath is a 32 y.o. G50P1011 female at [redacted]w[redacted]d, here for a NST for pregnancy complicated by [facility] and Diabetes: A2DM}.on Metformin.  She reports active fetal movement, contractions: none, vaginal bleeding: none, membranes: intact.   OBJECTIVE:  BP 114/74   Pulse 91   Wt (!) 328 lb 12.8 oz (149.1 kg)   BMI 54.72 kg/m   Appears well, no apparent distress  No results found for this or any previous visit (from the past 24 hour(s)).  NST: FHR baseline 125 bpm, Variability: moderate, Accelerations:present, Decelerations:  Absent= Cat 1/reactive Toco: none   ASSESSMENT: G3P1011 at [redacted]w[redacted]d with CHTN and Diabetes: A2DM} on metformin NST reactive  PLAN: EFM strip reviewed by Dr. Alysia Penna   Recommendations: keep next appointment as scheduled    Jobe Marker  07/01/2022 11:35 AM

## 2022-07-04 ENCOUNTER — Other Ambulatory Visit: Payer: 59

## 2022-07-05 ENCOUNTER — Other Ambulatory Visit (HOSPITAL_COMMUNITY)
Admission: RE | Admit: 2022-07-05 | Discharge: 2022-07-05 | Disposition: A | Discharge status: Home / Self Care | Payer: 59 | Source: Ambulatory Visit | Attending: Obstetrics & Gynecology | Admitting: Obstetrics & Gynecology

## 2022-07-05 ENCOUNTER — Encounter: Payer: Self-pay | Admitting: Obstetrics & Gynecology

## 2022-07-05 ENCOUNTER — Other Ambulatory Visit: Payer: 59

## 2022-07-05 ENCOUNTER — Ambulatory Visit (INDEPENDENT_AMBULATORY_CARE_PROVIDER_SITE_OTHER): Payer: 59 | Admitting: Obstetrics & Gynecology

## 2022-07-05 VITALS — BP 134/85 | HR 88 | Wt 326.4 lb

## 2022-07-05 DIAGNOSIS — Z3A36 36 weeks gestation of pregnancy: Secondary | ICD-10-CM | POA: Insufficient documentation

## 2022-07-05 DIAGNOSIS — O0992 Supervision of high risk pregnancy, unspecified, second trimester: Secondary | ICD-10-CM | POA: Diagnosis present

## 2022-07-05 DIAGNOSIS — O0993 Supervision of high risk pregnancy, unspecified, third trimester: Secondary | ICD-10-CM

## 2022-07-05 DIAGNOSIS — Z6841 Body Mass Index (BMI) 40.0 and over, adult: Secondary | ICD-10-CM

## 2022-07-05 DIAGNOSIS — E039 Hypothyroidism, unspecified: Secondary | ICD-10-CM

## 2022-07-05 DIAGNOSIS — O24415 Gestational diabetes mellitus in pregnancy, controlled by oral hypoglycemic drugs: Secondary | ICD-10-CM

## 2022-07-05 DIAGNOSIS — I10 Essential (primary) hypertension: Secondary | ICD-10-CM

## 2022-07-05 NOTE — Progress Notes (Signed)
HIGH-RISK PREGNANCY VISIT Patient name: Madison Mcgrath MRN [id number]  Date of birth: 1990/08/20 Chief Complaint:   Non-stress Test and Routine Prenatal Visit (Headaches, hot flashes, chest "feels weird")  History of Present Illness:   Madison Mcgrath is a 32 y.o. G3P1011 female at [redacted]w[redacted]d with an Estimated Date of Delivery: 08/02/22 being seen today for ongoing management of a high-risk pregnancy complicated by cHTN on labetalol 300 BID, A2DM on MTF 500/1000(EFW 37%) .    Today she reports no complaints. Contractions: Not present. Vag. Bleeding: None.  Movement: Present. denies leaking of fluid.      01/26/2022    9:50 AM 03/15/2021   10:34 AM 10/14/2020   11:04 AM 07/29/2020   10:24 AM 07/08/2020    1:54 PM  Depression screen PHQ 2/9  Decreased Interest 0 0 0 0 0  Down, Depressed, Hopeless 0 0 0 1 0  PHQ - 2 Score 0 0 0 1 0  Altered sleeping 0 0     Tired, decreased energy 0 3     Change in appetite 0 3     Feeling bad or failure about yourself  0 0     Trouble concentrating 0 2     Moving slowly or fidgety/restless 0 0     Suicidal thoughts 0 0     PHQ-9 Score 0 8           01/26/2022    9:51 AM 03/15/2021   10:35 AM 06/18/2020    9:51 AM  GAD 7 : Generalized Anxiety Score  Nervous, Anxious, on Edge 0 1 0  Control/stop worrying 0 0 1  Worry too much - different things 0 2 1  Trouble relaxing 0 0 0  Restless 0 0 0  Easily annoyed or irritable 0 3 1  Afraid - awful might happen 0 2 0  Total GAD 7 Score 0 8 3     Review of Systems:   Pertinent items are noted in HPI Denies abnormal vaginal discharge w/ itching/odor/irritation, headaches, visual changes, shortness of breath, chest pain, abdominal pain, severe nausea/vomiting, or problems with urination or bowel movements unless otherwise stated above. Pertinent History Reviewed:  Reviewed past medical,surgical, social, obstetrical and family history.  Reviewed problem list, medications and allergies. Physical Assessment:   Vitals:    07/05/22 1109  BP: 134/85  Pulse: 88  Weight: (!) 326 lb 6.4 oz (148.1 kg)  Body mass index is 54.32 kg/m.           Physical Examination:   General appearance: alert, well appearing, and in no distress  Mental status: alert, oriented to person, place, and time  Skin: warm & dry   Extremities: Edema: None    Cardiovascular: normal heart rate noted  Respiratory: normal respiratory effort, no distress  Abdomen: gravid, soft, non-tender  Pelvic: Cervical exam deferred         Fetal Status:     Movement: Present    Fetal Surveillance Testing today: reactive NST  Madison Mcgrath is at [redacted]w[redacted]d Estimated Date of Delivery: 08/02/22  NST being performed due to cHTN + A2DM  Today the NST is Reactive  Fetal Monitoring:  Baseline: 140 bpm, Variability: Good {> 6 bpm), Accelerations: Reactive, and Decelerations: Absent   reactive  The accelerations are >15 bpm and more than 2 in 20 minutes  Final diagnosis:  Reactive NST  Madison Arms, MD     Chaperone: N/A    No results  found for this or any previous visit (from the past 24 hour(s)).  Assessment & Plan:  High-risk pregnancy: G3P1011 at [redacted]w[redacted]d with an Estimated Date of Delivery: 08/02/22      ICD-10-CM   1. Encounter for supervision of high risk pregnancy in third trimester, antepartum  O09.93     2. [redacted] weeks gestation of pregnancy  Z3A.36 Culture, beta strep (group b only)    Cervicovaginal ancillary only    3. Chronic hypertension  I10     4. Gestational diabetes mellitus (GDM) in third trimester controlled on oral hypoglycemic drug  O24.415     5. Hypothyroidism, unspecified type  E03.9     6. Morbid obesity with BMI of 50.0-59.9, adult (HCC)  E66.01    Z68.43         Meds: No orders of the defined types were placed in this encounter.   Orders:  Orders Placed This Encounter  Procedures   Culture, beta strep (group b only)     Labs/procedures today: NST  Treatment Plan:  twice weekly surveillancew, IOL 37-39 weeks  based on clinical course    Follow-up: Return for keep scheduled.   Future Appointments  Date Time Provider Department Center  07/12/2022  2:15 PM [facility] - [facility] [place] [facility] None  07/12/2022  3:10 PM Madison Arms, MD [facility]  07/15/2022 10:30 AM [facility]UR[facility]  07/19/2022  8:30 AM [facility] [place] [facility] None  07/19/2022  9:10 AM Cheral Marker, CNM [facility]  07/22/2022 10:10 AM [facility]  07/26/2022 10:00 AM [facility] [place] [facility] None  07/26/2022 10:50 AM Madison Arms, MD [facility]  07/29/2022  9:30 AM [facility] NUR[facility]    Orders Placed This Encounter  Procedures   Culture, beta strep (group b only)   Madison Mcgrath  Attending Physician for the Center for [facility] 07/10/2022 8:38 PM

## 2022-07-06 LAB — CERVICOVAGINAL ANCILLARY ONLY
Chlamydia: NEGATIVE
Comment: NEGATIVE
Comment: NORMAL
Neisseria Gonorrhea: NEGATIVE

## 2022-07-07 ENCOUNTER — Other Ambulatory Visit: Payer: 59

## 2022-07-07 ENCOUNTER — Encounter: Payer: 59 | Admitting: Obstetrics & Gynecology

## 2022-07-08 ENCOUNTER — Ambulatory Visit (INDEPENDENT_AMBULATORY_CARE_PROVIDER_SITE_OTHER): Payer: 59 | Admitting: *Deleted

## 2022-07-08 VITALS — BP 128/88 | HR 95

## 2022-07-08 DIAGNOSIS — O0993 Supervision of high risk pregnancy, unspecified, third trimester: Secondary | ICD-10-CM

## 2022-07-08 DIAGNOSIS — I10 Essential (primary) hypertension: Secondary | ICD-10-CM | POA: Diagnosis not present

## 2022-07-08 DIAGNOSIS — O24415 Gestational diabetes mellitus in pregnancy, controlled by oral hypoglycemic drugs: Secondary | ICD-10-CM | POA: Diagnosis not present

## 2022-07-08 DIAGNOSIS — Z3A36 36 weeks gestation of pregnancy: Secondary | ICD-10-CM

## 2022-07-08 DIAGNOSIS — O0992 Supervision of high risk pregnancy, unspecified, second trimester: Secondary | ICD-10-CM

## 2022-07-08 LAB — CULTURE, BETA STREP (GROUP B ONLY)

## 2022-07-08 NOTE — Progress Notes (Signed)
   NURSE VISIT- NST  SUBJECTIVE:  Madison Mcgrath is a 32 y.o. G57P1011 female at [redacted]w[redacted]d, here for a NST for pregnancy complicated by [facility] and Diabetes: A2DM} on Metformin.  She reports active fetal movement, contractions: none, vaginal bleeding: none, membranes: intact.   OBJECTIVE:  BP 128/88   Pulse 95   Appears well, no apparent distress  No results found for this or any previous visit (from the past 24 hour(s)).  NST: FHR baseline 130 bpm, Variability: moderate, Accelerations:present, Decelerations:  Absent= Cat 1/reactive Toco: none   ASSESSMENT: G3P1011 at [redacted]w[redacted]d with CHTN and 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 Despina Hidden   Recommendations: keep next appointment as scheduled    Madison Mcgrath  07/08/2022 10:40 AM

## 2022-07-10 ENCOUNTER — Other Ambulatory Visit: Payer: Self-pay

## 2022-07-10 ENCOUNTER — Inpatient Hospital Stay (HOSPITAL_COMMUNITY)
Admission: AD | Admit: 2022-07-10 | Discharge: 2022-07-11 | Disposition: A | Discharge status: Home / Self Care | Payer: 59 | Attending: Obstetrics & Gynecology | Admitting: Obstetrics & Gynecology

## 2022-07-10 ENCOUNTER — Encounter: Payer: Self-pay | Admitting: Obstetrics & Gynecology

## 2022-07-10 ENCOUNTER — Encounter (HOSPITAL_COMMUNITY): Payer: Self-pay | Admitting: Obstetrics & Gynecology

## 2022-07-10 DIAGNOSIS — O479 False labor, unspecified: Secondary | ICD-10-CM

## 2022-07-10 DIAGNOSIS — Z3A36 36 weeks gestation of pregnancy: Secondary | ICD-10-CM | POA: Insufficient documentation

## 2022-07-10 DIAGNOSIS — O4703 False labor before 37 completed weeks of gestation, third trimester: Secondary | ICD-10-CM | POA: Insufficient documentation

## 2022-07-10 NOTE — MAU Note (Signed)
.  Madison Mcgrath is a 32 y.o. at [redacted]w[redacted]d here in MAU reporting: pelvic pressure for a few days. Was in the shower today and lost mucous plug.. states started having cramps in lower abdomen that wrapped around to her back. Laid down and drank some water - just seemed to get worse. Denies VB or LOF. +FM.   Onset of complaint: "this afternoon" unsure of what time.  Pain score: 6 - abdomen; 8 - pelvis Vitals:   07/10/22 2318  BP: 137/84  Pulse: 86  Resp: 19  Temp: 98 F (36.7 C)  SpO2: 98%     FHT:130 Lab orders placed from triage:  labor eval

## 2022-07-11 ENCOUNTER — Other Ambulatory Visit: Payer: 59

## 2022-07-11 ENCOUNTER — Other Ambulatory Visit: Payer: Self-pay | Admitting: Obstetrics & Gynecology

## 2022-07-11 DIAGNOSIS — O479 False labor, unspecified: Secondary | ICD-10-CM | POA: Diagnosis not present

## 2022-07-11 DIAGNOSIS — O99210 Obesity complicating pregnancy, unspecified trimester: Secondary | ICD-10-CM

## 2022-07-11 DIAGNOSIS — O24419 Gestational diabetes mellitus in pregnancy, unspecified control: Secondary | ICD-10-CM

## 2022-07-11 DIAGNOSIS — Z3A36 36 weeks gestation of pregnancy: Secondary | ICD-10-CM

## 2022-07-11 DIAGNOSIS — O4703 False labor before 37 completed weeks of gestation, third trimester: Secondary | ICD-10-CM | POA: Diagnosis present

## 2022-07-11 DIAGNOSIS — O10919 Unspecified pre-existing hypertension complicating pregnancy, unspecified trimester: Secondary | ICD-10-CM

## 2022-07-11 NOTE — Progress Notes (Signed)
S: Ms. Madison Mcgrath is a 31 y.o. G3P1011 at [redacted]w[redacted]d  who presents to MAU today complaining contractions today and lost her mucus plug a few days ago. She denies vaginal bleeding. She denies LOF. She reports normal fetal movement.    O: BP 132/76 (BP Location: Right Arm)   Pulse 87   Temp 98 F (36.7 C)   Resp 18   Ht 5\' 5"  (1.651 m)   Wt (!) 150 kg   SpO2 100%   BMI 55.03 kg/m   Cervical exam:  Dilation: 1.5 Effacement (%): 50 Cervical Position: Middle Station: -2 Presentation: Vertex Exam by:: Ginnie Smart RN   Fetal Monitoring: Baseline: 130 bpm Variability: Moderate Accelerations: Present Decelerations: Absent Contractions: None, irritability   A: SIUP at [redacted]w[redacted]d  False labor  P: -Routine OB care -MAU return precautions  Autry-Lott, Randa Evens, DO 07/11/2022 12:37 AM

## 2022-07-12 ENCOUNTER — Ambulatory Visit (INDEPENDENT_AMBULATORY_CARE_PROVIDER_SITE_OTHER): Payer: 59

## 2022-07-12 ENCOUNTER — Encounter: Payer: Self-pay | Admitting: Obstetrics & Gynecology

## 2022-07-12 ENCOUNTER — Ambulatory Visit (INDEPENDENT_AMBULATORY_CARE_PROVIDER_SITE_OTHER): Payer: 59 | Admitting: Obstetrics & Gynecology

## 2022-07-12 VITALS — BP 127/84 | HR 81 | Wt 328.0 lb

## 2022-07-12 DIAGNOSIS — O10913 Unspecified pre-existing hypertension complicating pregnancy, third trimester: Secondary | ICD-10-CM | POA: Diagnosis not present

## 2022-07-12 DIAGNOSIS — O24419 Gestational diabetes mellitus in pregnancy, unspecified control: Secondary | ICD-10-CM | POA: Diagnosis not present

## 2022-07-12 DIAGNOSIS — O10919 Unspecified pre-existing hypertension complicating pregnancy, unspecified trimester: Secondary | ICD-10-CM

## 2022-07-12 DIAGNOSIS — O0992 Supervision of high risk pregnancy, unspecified, second trimester: Secondary | ICD-10-CM

## 2022-07-12 DIAGNOSIS — O99213 Obesity complicating pregnancy, third trimester: Secondary | ICD-10-CM | POA: Diagnosis not present

## 2022-07-12 DIAGNOSIS — Z3A37 37 weeks gestation of pregnancy: Secondary | ICD-10-CM | POA: Diagnosis not present

## 2022-07-12 DIAGNOSIS — O24415 Gestational diabetes mellitus in pregnancy, controlled by oral hypoglycemic drugs: Secondary | ICD-10-CM

## 2022-07-12 DIAGNOSIS — O99210 Obesity complicating pregnancy, unspecified trimester: Secondary | ICD-10-CM

## 2022-07-12 DIAGNOSIS — I10 Essential (primary) hypertension: Secondary | ICD-10-CM

## 2022-07-12 NOTE — Progress Notes (Signed)
[place] 37 wks,cephalic,FHR 129 bpm,BPP 8/8,anterior placenta gr 2,RI .60,.66,.60,.63,.57=68%,AFI 15 cm

## 2022-07-12 NOTE — Progress Notes (Signed)
HIGH-RISK PREGNANCY VISIT Patient name: Madison Mcgrath MRN [id number]  Date of birth: [birth year] Chief Complaint:   Routine Prenatal Visit  History of Present Illness:   Madison Mcgrath is a 32 y.o. G60P1011 female at [redacted]w[redacted]d with an Estimated Date of Delivery: 08/02/22 being seen today for ongoing management of a high-risk pregnancy complicated by chronic hypertension currently on labetalol 300 BID and diabetes mellitus A2DM currently on MTF 500/1000 .    Today she reports no complaints. Contractions: Not present. Vag. Bleeding: None.  Movement: Present. denies leaking of fluid.      01/26/2022    9:50 AM 03/15/2021   10:34 AM 10/14/2020   11:04 AM 07/29/2020   10:24 AM 07/08/2020    1:54 PM  Depression screen PHQ 2/9  Decreased Interest 0 0 0 0 0  Down, Depressed, Hopeless 0 0 0 1 0  PHQ - 2 Score 0 0 0 1 0  Altered sleeping 0 0     Tired, decreased energy 0 3     Change in appetite 0 3     Feeling bad or failure about yourself  0 0     Trouble concentrating 0 2     Moving slowly or fidgety/restless 0 0     Suicidal thoughts 0 0     PHQ-9 Score 0 8           01/26/2022    9:51 AM 03/15/2021   10:35 AM 06/18/2020    9:51 AM  GAD 7 : Generalized Anxiety Score  Nervous, Anxious, on Edge 0 1 0  Control/stop worrying 0 0 1  Worry too much - different things 0 2 1  Trouble relaxing 0 0 0  Restless 0 0 0  Easily annoyed or irritable 0 3 1  Afraid - awful might happen 0 2 0  Total GAD 7 Score 0 8 3     Review of Systems:   Pertinent items are noted in HPI Denies abnormal vaginal discharge w/ itching/odor/irritation, headaches, visual changes, shortness of breath, chest pain, abdominal pain, severe nausea/vomiting, or problems with urination or bowel movements unless otherwise stated above. Pertinent History Reviewed:  Reviewed past medical,surgical, social, obstetrical and family history.  Reviewed problem list, medications and allergies. Physical Assessment:   Vitals:   07/12/22 1451   BP: 127/84  Pulse: 81  Weight: (!) 328 lb (148.8 kg)  Body mass index is 54.58 kg/m.           Physical Examination:   General appearance: alert, well appearing, and in no distress  Mental status: alert, oriented to person, place, and time  Skin: warm & dry   Extremities:      Cardiovascular: normal heart rate noted  Respiratory: normal respiratory effort, no distress  Abdomen: gravid, soft, non-tender  Pelvic: Cervical exam deferred         Fetal Status:     Movement: Present    Fetal Surveillance Testing today: BPP 8/8   Chaperone: N/A    No results found for this or any previous visit (from the past 24 hour(s)).  Assessment & Plan:  High-risk pregnancy: G3P1011 at [redacted]w[redacted]d with an Estimated Date of Delivery: 08/02/22      ICD-10-CM   1. Encounter for supervision of high risk pregnancy in second trimester, antepartum  O09.92     2. Chronic hypertension  I10     3. Gestational diabetes mellitus (GDM) in third trimester controlled on oral hypoglycemic drug  O24.415  Meds: No orders of the defined types were placed in this encounter.   Orders: No orders of the defined types were placed in this encounter.    Labs/procedures today: U/S  Treatment Plan:  twice weekly surveillance IOL 39w  Reviewed: Term labor symptoms and general obstetric precautions including but not limited to vaginal bleeding, contractions, leaking of fluid and fetal movement were reviewed in detail with the patient.  All questions were answered. Does have home bp cuff. Office bp cuff given: not applicable. Check bp daily, let us know if consistently >150 and/or >95.  Follow-up: Return for keep scheduled.   Future Appointments  Date Time Provider Department Center  07/15/2022 10:30 AM [facility] NURSE [facility]  07/19/2022  8:30 AM [facility] [place] [facility] None  07/19/2022  9:10 AM Cheral Marker, CNM [facility]  07/22/2022 10:10 AM [facility] NURSE [facility]  07/26/2022  10:00 AM [facility] [place] [facility] None  07/26/2022 10:50 AM Lazaro Arms, MD [facility]  07/29/2022  9:30 AM [facility] NURSE [facility]    No orders of the defined types were placed in this encounter.  Lazaro Arms  Attending Physician for the Center for [facility] 07/12/2022 3:21 PM

## 2022-07-14 ENCOUNTER — Other Ambulatory Visit: Payer: 59

## 2022-07-15 ENCOUNTER — Other Ambulatory Visit: Payer: Self-pay | Admitting: Obstetrics & Gynecology

## 2022-07-15 ENCOUNTER — Ambulatory Visit (INDEPENDENT_AMBULATORY_CARE_PROVIDER_SITE_OTHER): Payer: 59 | Admitting: *Deleted

## 2022-07-15 ENCOUNTER — Other Ambulatory Visit (INDEPENDENT_AMBULATORY_CARE_PROVIDER_SITE_OTHER): Payer: 59

## 2022-07-15 VITALS — BP 128/90 | HR 89

## 2022-07-15 DIAGNOSIS — I10 Essential (primary) hypertension: Secondary | ICD-10-CM | POA: Diagnosis not present

## 2022-07-15 DIAGNOSIS — O288 Other abnormal findings on antenatal screening of mother: Secondary | ICD-10-CM

## 2022-07-15 DIAGNOSIS — O0993 Supervision of high risk pregnancy, unspecified, third trimester: Secondary | ICD-10-CM

## 2022-07-15 DIAGNOSIS — O0992 Supervision of high risk pregnancy, unspecified, second trimester: Secondary | ICD-10-CM

## 2022-07-15 DIAGNOSIS — Z3A37 37 weeks gestation of pregnancy: Secondary | ICD-10-CM

## 2022-07-15 DIAGNOSIS — O10913 Unspecified pre-existing hypertension complicating pregnancy, third trimester: Secondary | ICD-10-CM

## 2022-07-15 DIAGNOSIS — O24415 Gestational diabetes mellitus in pregnancy, controlled by oral hypoglycemic drugs: Secondary | ICD-10-CM | POA: Diagnosis not present

## 2022-07-15 NOTE — Progress Notes (Signed)
   NURSE VISIT- NST  SUBJECTIVE:  Madison Mcgrath is a 32 y.o. G34P1011 female at [redacted]w[redacted]d, here for a NST for pregnancy complicated by [facility] and chronic hypertension.  She reports active fetal movement, contractions: none, vaginal bleeding: none, membranes: intact.   OBJECTIVE:  BP (!) 128/90   Pulse 89   Appears well, no apparent distress  No results found for this or any previous visit (from the past 24 hour(s)).  NST: FHR baseline 130 bpm, Variability: moderate, Accelerations:present, Decelerations:  Absent= Cat 1/non-reactive Toco: none   ASSESSMENT: G3P1011 at [redacted]w[redacted]d with CHTN and Diabetes: A2DM} on Metformin NST nonreactive  PLAN: EFM strip reviewed by Dr. Despina Hidden   Recommendations: work-in for BPP    Madison Mcgrath  07/15/2022 12:05 PM

## 2022-07-15 NOTE — Progress Notes (Signed)
[place] 37+3 wks,cephalic,BPP 8/8,FHR 135 BPM,anterior placenta gr 2,RI .59,.56,.62,.61=79%,AFI 13.7 cm,limited view

## 2022-07-18 ENCOUNTER — Other Ambulatory Visit: Payer: Self-pay | Admitting: Obstetrics & Gynecology

## 2022-07-18 ENCOUNTER — Other Ambulatory Visit: Payer: 59

## 2022-07-18 DIAGNOSIS — O24419 Gestational diabetes mellitus in pregnancy, unspecified control: Secondary | ICD-10-CM

## 2022-07-18 DIAGNOSIS — O10913 Unspecified pre-existing hypertension complicating pregnancy, third trimester: Secondary | ICD-10-CM

## 2022-07-19 ENCOUNTER — Ambulatory Visit (INDEPENDENT_AMBULATORY_CARE_PROVIDER_SITE_OTHER): Payer: 59

## 2022-07-19 ENCOUNTER — Ambulatory Visit (INDEPENDENT_AMBULATORY_CARE_PROVIDER_SITE_OTHER): Payer: 59 | Admitting: Women's Health

## 2022-07-19 ENCOUNTER — Encounter: Payer: Self-pay | Admitting: Women's Health

## 2022-07-19 ENCOUNTER — Telehealth (HOSPITAL_COMMUNITY): Payer: Self-pay | Admitting: *Deleted

## 2022-07-19 ENCOUNTER — Encounter (HOSPITAL_COMMUNITY): Payer: Self-pay

## 2022-07-19 VITALS — BP 129/83 | HR 100 | Wt 330.0 lb

## 2022-07-19 DIAGNOSIS — O0993 Supervision of high risk pregnancy, unspecified, third trimester: Secondary | ICD-10-CM

## 2022-07-19 DIAGNOSIS — O24419 Gestational diabetes mellitus in pregnancy, unspecified control: Secondary | ICD-10-CM | POA: Diagnosis not present

## 2022-07-19 DIAGNOSIS — Z3A38 38 weeks gestation of pregnancy: Secondary | ICD-10-CM | POA: Diagnosis not present

## 2022-07-19 DIAGNOSIS — Z1389 Encounter for screening for other disorder: Secondary | ICD-10-CM

## 2022-07-19 DIAGNOSIS — Z331 Pregnant state, incidental: Secondary | ICD-10-CM

## 2022-07-19 DIAGNOSIS — O10913 Unspecified pre-existing hypertension complicating pregnancy, third trimester: Secondary | ICD-10-CM

## 2022-07-19 DIAGNOSIS — O0992 Supervision of high risk pregnancy, unspecified, second trimester: Secondary | ICD-10-CM

## 2022-07-19 DIAGNOSIS — I10 Essential (primary) hypertension: Secondary | ICD-10-CM

## 2022-07-19 DIAGNOSIS — O24415 Gestational diabetes mellitus in pregnancy, controlled by oral hypoglycemic drugs: Secondary | ICD-10-CM

## 2022-07-19 LAB — POCT URINALYSIS DIPSTICK OB
Glucose, UA: NEGATIVE
Ketones, UA: NEGATIVE
Leukocytes, UA: NEGATIVE
Nitrite, UA: NEGATIVE
POC,PROTEIN,UA: NEGATIVE

## 2022-07-19 NOTE — Telephone Encounter (Signed)
Preadmission screen  

## 2022-07-19 NOTE — Patient Instructions (Signed)
Kerrilynn, thank you for choosing our office today! We appreciate the opportunity to meet your healthcare needs. You may receive a short survey by mail, e-mail, or through MyChart. If you are happy with your care we would appreciate if you could take just a few minutes to complete the survey questions. We read all of your comments and take your feedback very seriously. Thank you again for choosing our office.  [facility]  [facility] ([address] [place], [place] [address]) Entrance C, located off of [address] Free 24/7 valet parking   CLASSES: Go to [email or web address] to register for classes (childbirth, breastfeeding, waterbirth, infant CPR, daddy bootcamp, etc.)  Call the office ([phone]) or go to [facility] if: You begin to have strong, frequent contractions Your water breaks.  Sometimes it is a big gush of fluid, sometimes it is just a trickle that keeps getting your panties wet or running down your legs You have vaginal bleeding.  It is normal to have a small amount of spotting if your cervix was checked.  You don't feel your baby moving like normal.  If you don't, get you something to eat and drink and lay down and focus on feeling your baby move.   If your baby is still not moving like normal, you should call the office or go to [facility].  Call the office ([phone]) or go to [facility] for these signs of pre-eclampsia: Severe headache that does not go away with Tylenol Visual changes- seeing spots, double, blurred vision Pain under your right breast or upper abdomen that does not go away with Tums or heartburn medicine Nausea and/or vomiting Severe swelling in your hands, feet, and face   [place] [facility]/Family Doctors [facility] ([facility]): [address], [phone]           [facility]: [address], [phone]                 [facility] ([facility]): [address], [phone] (call to ask if accepting patients) [facility]: [address], [place], [phone]    [place] Pediatric[facility]/Family Doctors [facility] ([facility]): [address], [phone] [facility]: [address], [phone] [facility]: [address], [phone]  [place] [facility]  [facility] ([facility]): [phone] [facility]: [address], [phone]   [place] [facility]: [address], [phone]  [place] [facility]  [facility]: [phone], [phone]  Home Blood Pressure Monitoring for Patients   Your provider has recommended that you check your blood pressure (BP) at least once a week at home. If you do not have a blood pressure cuff at home, one will be provided for you. Contact your provider if you have not received your monitor within 1 week.   Helpful Tips for Accurate Home Blood Pressure Checks  Don't smoke, exercise, or drink caffeine 30 minutes before checking your BP Use the restroom before checking your BP (a full bladder can raise your pressure) Relax in a comfortable upright chair Feet on the ground Left arm resting comfortably on a flat surface at the level of your heart Legs uncrossed Back supported Sit quietly and don't talk Place the cuff on your bare arm Adjust snuggly, so that only two fingertips   can fit between your skin and the top of the cuff Check 2 readings separated by at least one minute Keep a log of your BP readings For a visual, please reference this diagram: http://ccnc.care/bpdiagram  Provider Name: [facility]     Phone: [phone]  Zone 1: ALL CLEAR  Continue to monitor your symptoms:  BP reading is less than 140 (top number) or less than 90 (bottom number)  No right  upper stomach pain No headaches or seeing spots No feeling nauseated or throwing up No swelling in face and hands  Zone 2: CAUTION Call your doctor's office for any of the following:  BP reading is greater than 140 (top number) or greater than 90 (bottom number)  Stomach pain under your ribs in the middle or right side Headaches or seeing spots Feeling nauseated or throwing up Swelling in face and hands  Zone 3: EMERGENCY  Seek immediate medical care if you have any of the following:  BP reading is greater than160 (top number) or greater than 110 (bottom number) Severe headaches not improving with Tylenol Serious difficulty catching your breath Any worsening symptoms from Zone 2   Braxton Hicks Contractions Contractions of the uterus can occur throughout pregnancy, but they are not always a sign that you are in labor. You may have practice contractions called Braxton Hicks contractions. These false labor contractions are sometimes confused with true labor. What are Braxton Hicks contractions? Braxton Hicks contractions are tightening movements that occur in the muscles of the uterus before labor. Unlike true labor contractions, these contractions do not result in opening (dilation) and thinning of the cervix. Toward the end of pregnancy (32-34 weeks), Braxton Hicks contractions can happen more often and may become stronger. These contractions are sometimes difficult to tell apart from true labor because they can be very uncomfortable. You should not feel embarrassed if you go to the hospital with false labor. Sometimes, the only way to tell if you are in true labor is for your health care provider to look for changes in the cervix. The health care provider will do a physical exam and may monitor your contractions. If you are not in true labor, the exam should show that your cervix is not dilating and your water has not broken. If there are no other health problems associated with your  pregnancy, it is completely safe for you to be sent home with false labor. You may continue to have Braxton Hicks contractions until you go into true labor. How to tell the difference between true labor and false labor True labor Contractions last 30-70 seconds. Contractions become very regular. Discomfort is usually felt in the top of the uterus, and it spreads to the lower abdomen and low back. Contractions do not go away with walking. Contractions usually become more intense and increase in frequency. The cervix dilates and gets thinner. False labor Contractions are usually shorter and not as strong as true labor contractions. Contractions are usually irregular. Contractions are often felt in the front of the lower abdomen and in the groin. Contractions may go away when you walk around or change positions while lying down. Contractions get weaker and are shorter-lasting as time goes on. The cervix usually does not dilate or become thin. Follow these instructions at home:  Take over-the-counter and prescription medicines only as told by your health care provider. Keep up with your usual exercises and follow other instructions from your health care provider. Eat and drink lightly if you think   you are going into labor. If Braxton Hicks contractions are making you uncomfortable: Change your position from lying down or resting to walking, or change from walking to resting. Sit and rest in a tub of warm water. Drink enough fluid to keep your urine pale yellow. Dehydration may cause these contractions. Do slow and deep breathing several times an hour. Keep all follow-up prenatal visits as told by your health care provider. This is important. Contact a health care provider if: You have a fever. You have continuous pain in your abdomen. Get help right away if: Your contractions become stronger, more regular, and closer together. You have fluid leaking or gushing from your vagina. You pass  blood-tinged mucus (bloody show). You have bleeding from your vagina. You have low back pain that you never had before. You feel your baby's head pushing down and causing pelvic pressure. Your baby is not moving inside you as much as it used to. Summary Contractions that occur before labor are called Braxton Hicks contractions, false labor, or practice contractions. Braxton Hicks contractions are usually shorter, weaker, farther apart, and less regular than true labor contractions. True labor contractions usually become progressively stronger and regular, and they become more frequent. Manage discomfort from Braxton Hicks contractions by changing position, resting in a warm bath, drinking plenty of water, or practicing deep breathing. This information is not intended to replace advice given to you by your health care provider. Make sure you discuss any questions you have with your health care provider. Document Revised: 12/23/2016 Document Reviewed: 05/26/2016 Elsevier Patient Education  2020 [employer]   

## 2022-07-19 NOTE — Progress Notes (Signed)
[place] 38 wks,cephalic,anterior placenta gr 2,BPP 8/8,FHR 135 bpm,AFI 16 cm,RI 63,.64,.64=84%

## 2022-07-19 NOTE — Progress Notes (Signed)
HIGH-RISK PREGNANCY VISIT Patient name: Madison Mcgrath MRN [id number]  Date of birth: 08/19/1990 Chief Complaint:   Routine Prenatal Visit and Pregnancy Ultrasound  History of Present Illness:   Madison Mcgrath is a 32 y.o. G57P1011 female at [redacted]w[redacted]d with an Estimated Date of Delivery: 08/02/22 being seen today for ongoing management of a high-risk pregnancy complicated by chronic hypertension currently on labetalol 300mg  BID, diabetes mellitus A2DM currently on metformin 500mg  AM/1000PM , and morbid obesity BMI >=40.    Today she reports  got up this am felt a lot of pressure, then started leaking on way to br. None since . Reports sugars all wnl.  Contractions: Not present.  .  Movement: Present. reports leaking of fluid.      01/26/2022    9:50 AM 03/15/2021   10:34 AM 10/14/2020   11:04 AM 07/29/2020   10:24 AM 07/08/2020    1:54 PM  Depression screen PHQ 2/9  Decreased Interest 0 0 0 0 0  Down, Depressed, Hopeless 0 0 0 1 0  PHQ - 2 Score 0 0 0 1 0  Altered sleeping 0 0     Tired, decreased energy 0 3     Change in appetite 0 3     Feeling bad or failure about yourself  0 0     Trouble concentrating 0 2     Moving slowly or fidgety/restless 0 0     Suicidal thoughts 0 0     PHQ-9 Score 0 8           01/26/2022    9:51 AM 03/15/2021   10:35 AM 06/18/2020    9:51 AM  GAD 7 : Generalized Anxiety Score  Nervous, Anxious, on Edge 0 1 0  Control/stop worrying 0 0 1  Worry too much - different things 0 2 1  Trouble relaxing 0 0 0  Restless 0 0 0  Easily annoyed or irritable 0 3 1  Afraid - awful might happen 0 2 0  Total GAD 7 Score 0 8 3     Review of Systems:   Pertinent items are noted in HPI Denies abnormal vaginal discharge w/ itching/odor/irritation, headaches, visual changes, shortness of breath, chest pain, abdominal pain, severe nausea/vomiting, or problems with urination or bowel movements unless otherwise stated above. Pertinent History Reviewed:  Reviewed past  medical,surgical, social, obstetrical and family history.  Reviewed problem list, medications and allergies. Physical Assessment:   Vitals:   07/19/22 0905  BP: 129/83  Pulse: 100  Weight: (!) 330 lb (149.7 kg)  Body mass index is 54.91 kg/m.           Physical Examination:   General appearance: alert, well appearing, and in no distress  Mental status: alert, oriented to person, place, and time  Skin: warm & dry   Extremities: Edema: None    Cardiovascular: normal heart rate noted  Respiratory: normal respiratory effort, no distress  Abdomen: gravid, soft, non-tender  Pelvic: SSE no pooling, no change w/ valsalva Cervical exam performed  Dilation: 3 Effacement (%): Thick Station: Ballotable  Fetal Status:     Movement: Present Presentation: Vertex  Fetal Surveillance Testing today: [place] 38 wks,cephalic,anterior placenta gr 2,BPP 8/8,FHR 135 bpm,AFI 16 cm,RI 63,.64,.64=84%    Chaperone: Peggy Dones    No results found for this or any previous visit (from the past 24 hour(s)).  Assessment & Plan:  High-risk pregnancy: G3P1011 at [redacted]w[redacted]d with an Estimated Date of Delivery: 08/02/22  1) CHTN, stable on labetalol 300mg  BID, reviewed pre-e s/s, reasons to seek care   2) A2DM, stable on metformin 500AM/1000PM, EFW 37% today   3) BMI 54  4) Leaking fluid this am>none since, AFI 16, normal exam, was likely urine  5) Hypothyroid> on synthroid  6) Bipolar/ADHD> on lamictal  Meds: No orders of the defined types were placed in this encounter.   Labs/procedures today: spec exam, SVE, and U/S  Treatment Plan:  IOL scheduled for 7/2 MN,  IOL form faxed and orders placed   Reviewed: Preterm labor symptoms and general obstetric precautions including but not limited to vaginal bleeding, contractions, leaking of fluid and fetal movement were reviewed in detail with the patient.  All questions were answered. Does have home bp cuff. Office bp cuff given: not applicable. Check bp weekly, let  us know if consistently >140 and/or >90.  Follow-up: Return for As scheduled 6/28, cancel all after.   Future Appointments  Date Time Provider Department Center  07/22/2022 10:10 AM [facility] NURSE [facility]  07/26/2022 12:00 AM [facility] SCHED ROOM [facility] None  07/26/2022 10:00 AM [facility] [place] [facility] None  07/26/2022 10:50 AM Lazaro Arms, MD [facility]  07/29/2022  9:30 AM [facility] NURSE [facility]    Orders Placed This Encounter  Procedures   POC Urinalysis Dipstick OB   Cheral Marker CNM, [facility] 07/19/2022 9:35 AM

## 2022-07-20 ENCOUNTER — Encounter: Payer: Self-pay | Admitting: Women's Health

## 2022-07-21 ENCOUNTER — Encounter (HOSPITAL_COMMUNITY): Payer: Self-pay | Admitting: Obstetrics and Gynecology

## 2022-07-21 ENCOUNTER — Other Ambulatory Visit: Payer: Self-pay

## 2022-07-21 ENCOUNTER — Encounter: Payer: Self-pay | Admitting: Women's Health

## 2022-07-21 ENCOUNTER — Telehealth (HOSPITAL_COMMUNITY): Payer: Self-pay | Admitting: *Deleted

## 2022-07-21 ENCOUNTER — Encounter (HOSPITAL_COMMUNITY): Payer: Self-pay | Admitting: *Deleted

## 2022-07-21 ENCOUNTER — Other Ambulatory Visit: Payer: 59

## 2022-07-21 ENCOUNTER — Inpatient Hospital Stay (HOSPITAL_COMMUNITY)
Admission: AD | Admit: 2022-07-21 | Discharge: 2022-07-21 | Disposition: A | Discharge status: Home / Self Care | Payer: 59 | Attending: Obstetrics and Gynecology | Admitting: Obstetrics and Gynecology

## 2022-07-21 DIAGNOSIS — Z3A38 38 weeks gestation of pregnancy: Secondary | ICD-10-CM | POA: Insufficient documentation

## 2022-07-21 DIAGNOSIS — O479 False labor, unspecified: Secondary | ICD-10-CM | POA: Insufficient documentation

## 2022-07-21 LAB — URINALYSIS, ROUTINE W REFLEX MICROSCOPIC
Bilirubin Urine: NEGATIVE
Glucose, UA: NEGATIVE mg/dL
Hgb urine dipstick: NEGATIVE
Ketones, ur: NEGATIVE mg/dL
Nitrite: NEGATIVE
Protein, ur: NEGATIVE mg/dL
Specific Gravity, Urine: 1.018 (ref 1.005–1.030)
pH: 6 (ref 5.0–8.0)

## 2022-07-21 NOTE — Telephone Encounter (Signed)
Preadmission screen  

## 2022-07-21 NOTE — MAU Provider Note (Signed)
Ms. Madison Mcgrath is a [id number] at [redacted]w[redacted]d seen in MAU for labor. RN labor check, not seen by provider.   SVE by RN Dilation: 2.5 Effacement (%): Thick Cervical Position: Posterior Station: -2 Presentation: Undeterminable Exam by:: A Sherlon Handing RN   NST - FHR: 135 bpm / moderate variability / accels present / decels absent NST reactive / TOCO: irregular mild contractions  Plan:  D/C home with labor precautions Keep scheduled appt with [facility]. Return to MAU as needed.  Sharen Counter, CNM  07/21/2022 9:21 PM

## 2022-07-21 NOTE — Discharge Instructions (Signed)
Reasons to return to MAU at [facility]:  1.  Contractions are  5 minutes apart or less, each last 1 minute, these have been going on for 1-2 hours, and you cannot walk or talk during them 2.  You have a large gush of fluid, or a trickle of fluid that will not stop and you have to wear a pad 3.  You have bleeding that is bright red, heavier than spotting--like menstrual bleeding (spotting can be normal in early labor or after a check of your cervix) 4.  You do not feel the baby moving like he/she normally does  

## 2022-07-21 NOTE — MAU Note (Signed)
.  Madison Mcgrath is a 32 y.o. at [redacted]w[redacted]d here in MAU reporting: had appointment Tuesday - had SVE and had brown to pink discharge afterwards, but that stopped. Started having ctx yesterday and into today. States had a nosebleed this morning. Pains in legs yesterday - cramps in upper thighs, but not as bad today. Tightness feeling in abdomen. States ctx have been 3-5 minutes, but sometimes longer - feels them mainly in her lower back. Just wanted to come to get checked out, "to be safe". Denies VB or LOF. +FM. CHTN - on Labetalol 300mg  - last dose this morning; still needs night time dose. Denies HA, visual disturbances, or epigastric pain.   Pain score: 5 - ctx Vitals:   07/21/22 1934  BP: (!) 150/97  Pulse: 100  Resp: 19  Temp: 98 F (36.7 C)  SpO2: 99%     FHT:138 Lab orders placed from triage:  UA

## 2022-07-22 ENCOUNTER — Ambulatory Visit (INDEPENDENT_AMBULATORY_CARE_PROVIDER_SITE_OTHER): Payer: 59 | Admitting: *Deleted

## 2022-07-22 VITALS — BP 123/83 | HR 86 | Wt 332.4 lb

## 2022-07-22 DIAGNOSIS — O0993 Supervision of high risk pregnancy, unspecified, third trimester: Secondary | ICD-10-CM

## 2022-07-22 DIAGNOSIS — Z3A38 38 weeks gestation of pregnancy: Secondary | ICD-10-CM

## 2022-07-22 DIAGNOSIS — I10 Essential (primary) hypertension: Secondary | ICD-10-CM | POA: Diagnosis not present

## 2022-07-22 DIAGNOSIS — O24415 Gestational diabetes mellitus in pregnancy, controlled by oral hypoglycemic drugs: Secondary | ICD-10-CM | POA: Diagnosis not present

## 2022-07-22 DIAGNOSIS — O0992 Supervision of high risk pregnancy, unspecified, second trimester: Secondary | ICD-10-CM

## 2022-07-22 NOTE — Progress Notes (Signed)
   NURSE VISIT- NST  SUBJECTIVE:  Madison Mcgrath is a 32 y.o. G73P1011 female at [redacted]w[redacted]d, here for a NST for pregnancy complicated by [facility] and Diabetes: A2DM} on Metformin.  She reports active fetal movement, contractions: none, vaginal bleeding: none, membranes: intact.   OBJECTIVE:  BP 123/83   Pulse 86   Wt (!) 332 lb 6.4 oz (150.8 kg)   BMI 55.31 kg/m   Appears well, no apparent distress  Results for orders placed or performed during the hospital encounter of 07/21/22 (from the past 24 hour(s))  Urinalysis, Routine w reflex microscopic -Urine, Clean Catch   Collection Time: 07/21/22  7:42 PM  Result Value Ref Range   Color, Urine YELLOW YELLOW   APPearance HAZY (A) CLEAR   Specific Gravity, Urine 1.018 1.005 - 1.030   pH 6.0 5.0 - 8.0   Glucose, UA NEGATIVE NEGATIVE mg/dL   Hgb urine dipstick NEGATIVE NEGATIVE   Bilirubin Urine NEGATIVE NEGATIVE   Ketones, ur NEGATIVE NEGATIVE mg/dL   Protein, ur NEGATIVE NEGATIVE mg/dL   Nitrite NEGATIVE NEGATIVE   Leukocytes,Ua TRACE (A) NEGATIVE   RBC / HPF 0-5 0 - 5 RBC/hpf   WBC, UA 0-5 0 - 5 WBC/hpf   Bacteria, UA RARE (A) NONE SEEN   Squamous Epithelial / HPF 11-20 0 - 5 /HPF   Mucus PRESENT     NST: FHR baseline 135 bpm, Variability: moderate, Accelerations:present, Decelerations:  Absent= Cat 1/reactive Toco: none   ASSESSMENT: G3P1011 at [redacted]w[redacted]d with CHTN and 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 Alysia Penna   Recommendations: keep next appointment as scheduled    Jobe Marker  07/22/2022 11:06 AM

## 2022-07-23 ENCOUNTER — Other Ambulatory Visit: Payer: Self-pay | Admitting: Advanced Practice Midwife

## 2022-07-25 ENCOUNTER — Other Ambulatory Visit: Payer: Self-pay | Admitting: Advanced Practice Midwife

## 2022-07-25 ENCOUNTER — Other Ambulatory Visit: Payer: Self-pay

## 2022-07-25 ENCOUNTER — Inpatient Hospital Stay (HOSPITAL_COMMUNITY)
Admission: AD | Admit: 2022-07-25 | Discharge: 2022-07-28 | DRG: 807 | Disposition: A | Discharge status: Home / Self Care | Payer: 59 | Attending: Obstetrics & Gynecology | Admitting: Obstetrics & Gynecology

## 2022-07-25 ENCOUNTER — Other Ambulatory Visit: Payer: 59

## 2022-07-25 ENCOUNTER — Encounter (HOSPITAL_COMMUNITY): Payer: Self-pay | Admitting: Obstetrics and Gynecology

## 2022-07-25 DIAGNOSIS — O10919 Unspecified pre-existing hypertension complicating pregnancy, unspecified trimester: Secondary | ICD-10-CM | POA: Diagnosis present

## 2022-07-25 DIAGNOSIS — O1092 Unspecified pre-existing hypertension complicating childbirth: Principal | ICD-10-CM | POA: Diagnosis present

## 2022-07-25 DIAGNOSIS — F319 Bipolar disorder, unspecified: Secondary | ICD-10-CM | POA: Diagnosis present

## 2022-07-25 DIAGNOSIS — Z30017 Encounter for initial prescription of implantable subdermal contraceptive: Secondary | ICD-10-CM | POA: Diagnosis not present

## 2022-07-25 DIAGNOSIS — O99344 Other mental disorders complicating childbirth: Secondary | ICD-10-CM | POA: Diagnosis present

## 2022-07-25 DIAGNOSIS — O99284 Endocrine, nutritional and metabolic diseases complicating childbirth: Secondary | ICD-10-CM | POA: Diagnosis present

## 2022-07-25 DIAGNOSIS — O24424 Gestational diabetes mellitus in childbirth, insulin controlled: Secondary | ICD-10-CM | POA: Diagnosis not present

## 2022-07-25 DIAGNOSIS — Z79890 Hormone replacement therapy (postmenopausal): Secondary | ICD-10-CM | POA: Diagnosis not present

## 2022-07-25 DIAGNOSIS — Z3A38 38 weeks gestation of pregnancy: Secondary | ICD-10-CM

## 2022-07-25 DIAGNOSIS — O24425 Gestational diabetes mellitus in childbirth, controlled by oral hypoglycemic drugs: Secondary | ICD-10-CM | POA: Diagnosis present

## 2022-07-25 DIAGNOSIS — O0993 Supervision of high risk pregnancy, unspecified, third trimester: Secondary | ICD-10-CM

## 2022-07-25 DIAGNOSIS — O1002 Pre-existing essential hypertension complicating childbirth: Secondary | ICD-10-CM | POA: Diagnosis not present

## 2022-07-25 DIAGNOSIS — E039 Hypothyroidism, unspecified: Secondary | ICD-10-CM | POA: Diagnosis present

## 2022-07-25 DIAGNOSIS — O99214 Obesity complicating childbirth: Secondary | ICD-10-CM | POA: Diagnosis present

## 2022-07-25 DIAGNOSIS — Z3A39 39 weeks gestation of pregnancy: Secondary | ICD-10-CM | POA: Diagnosis not present

## 2022-07-25 LAB — CBC
HCT: 39.6 % (ref 36.0–46.0)
Hemoglobin: 12.9 g/dL (ref 12.0–15.0)
MCH: 26.2 pg (ref 26.0–34.0)
MCHC: 32.6 g/dL (ref 30.0–36.0)
MCV: 80.3 fL (ref 80.0–100.0)
Platelets: 275 10*3/uL (ref 150–400)
RBC: 4.93 MIL/uL (ref 3.87–5.11)
RDW: 15.5 % (ref 11.5–15.5)
WBC: 10.8 10*3/uL — ABNORMAL HIGH (ref 4.0–10.5)
nRBC: 0 % (ref 0.0–0.2)

## 2022-07-25 LAB — GLUCOSE, CAPILLARY: Glucose-Capillary: 118 mg/dL — ABNORMAL HIGH (ref 70–99)

## 2022-07-25 LAB — TYPE AND SCREEN
ABO/RH(D): O POS
Antibody Screen: NEGATIVE

## 2022-07-25 MED ORDER — HYDROXYZINE HCL 50 MG PO TABS: 50.0000 mg | ORAL_TABLET | Freq: Four times a day (QID) | ORAL | Status: DC | PRN

## 2022-07-25 MED ORDER — OXYCODONE-ACETAMINOPHEN 5-325 MG PO TABS: 2.0000 | ORAL_TABLET | ORAL | Status: DC | PRN

## 2022-07-25 MED ORDER — FLEET ENEMA 7-19 GM/118ML RE ENEM: 1.0000 | ENEMA | RECTAL | Status: DC | PRN

## 2022-07-25 MED ORDER — OXYCODONE-ACETAMINOPHEN 5-325 MG PO TABS: 1.0000 | ORAL_TABLET | ORAL | Status: DC | PRN

## 2022-07-25 MED ORDER — SOD CITRATE-CITRIC ACID 500-334 MG/5ML PO SOLN: 30.0000 mL | ORAL | Status: DC | PRN

## 2022-07-25 MED ORDER — OXYTOCIN BOLUS FROM INFUSION
333.0000 mL | Freq: Once | INTRAVENOUS | Status: AC
  Administered 2022-07-26: 333 mL via INTRAVENOUS

## 2022-07-25 MED ORDER — TERBUTALINE SULFATE 1 MG/ML IJ SOLN: 0.2500 mg | Freq: Once | INTRAMUSCULAR | Status: DC | PRN

## 2022-07-25 MED ORDER — MISOPROSTOL 25 MCG QUARTER TABLET: 25.0000 ug | ORAL_TABLET | Freq: Once | ORAL | Status: DC

## 2022-07-25 MED ORDER — LACTATED RINGERS IV SOLN: INTRAVENOUS | Status: DC

## 2022-07-25 MED ORDER — LACTATED RINGERS IV SOLN
500.0000 mL | INTRAVENOUS | Status: DC | PRN
  Administered 2022-07-26: 500 mL via INTRAVENOUS

## 2022-07-25 MED ORDER — LIDOCAINE HCL (PF) 1 % IJ SOLN: 30.0000 mL | INTRAMUSCULAR | Status: DC | PRN

## 2022-07-25 MED ORDER — ACETAMINOPHEN 325 MG PO TABS: 650.0000 mg | ORAL_TABLET | ORAL | Status: DC | PRN

## 2022-07-25 MED ORDER — ONDANSETRON HCL 4 MG/2ML IJ SOLN: 4.0000 mg | Freq: Four times a day (QID) | INTRAMUSCULAR | Status: DC | PRN

## 2022-07-25 MED ORDER — MISOPROSTOL 50MCG HALF TABLET: 50.0000 ug | ORAL_TABLET | Freq: Once | ORAL | Status: DC

## 2022-07-25 MED ORDER — FENTANYL CITRATE (PF) 100 MCG/2ML IJ SOLN
100.0000 ug | INTRAMUSCULAR | Status: DC | PRN
  Administered 2022-07-26: 100 ug via INTRAVENOUS
  Filled 2022-07-25: qty 2

## 2022-07-25 MED ORDER — OXYTOCIN-SODIUM CHLORIDE 30-0.9 UT/500ML-% IV SOLN
2.5000 [IU]/h | INTRAVENOUS | Status: DC
  Administered 2022-07-26: 2.5 [IU]/h via INTRAVENOUS
  Filled 2022-07-25: qty 500

## 2022-07-25 NOTE — MAU Note (Signed)
.  Madison Mcgrath is a 32 y.o. at [redacted]w[redacted]d here in MAU reporting:   Contractions every: 1-2 minutes Onset of ctx: Today 1600 Pain score: 8/10  ROM: Intact  Vaginal Bleeding: None Last SVE: 3 cm 6/4  Epidural: Planning  Fetal Movement: Reports positive FM FHT:138 via External  Vitals:   07/25/22 2007  BP: (!) 145/90  Pulse: 91  Resp: 20  Temp: 98.2 F (36.8 C)  SpO2: 98%     GDM, CHTN on Labetalol 300bid (last dose this am), Hypothyroid   OB Office: Faculty GBS: Negative HSV: Denies hx of HSV Lab orders placed from triage: MAU Labor Eval
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
## 2022-07-26 ENCOUNTER — Inpatient Hospital Stay (HOSPITAL_COMMUNITY): Payer: 59 | Attending: Obstetrics & Gynecology

## 2022-07-26 ENCOUNTER — Other Ambulatory Visit: Payer: 59

## 2022-07-26 ENCOUNTER — Inpatient Hospital Stay (HOSPITAL_COMMUNITY): Payer: 59 | Admitting: Anesthesiology

## 2022-07-26 ENCOUNTER — Inpatient Hospital Stay (HOSPITAL_COMMUNITY): Admission: RE | Admit: 2022-07-26 | Payer: 59 | Source: Home / Self Care | Admitting: Obstetrics & Gynecology

## 2022-07-26 ENCOUNTER — Encounter (HOSPITAL_COMMUNITY): Payer: Self-pay | Admitting: Obstetrics & Gynecology

## 2022-07-26 ENCOUNTER — Encounter: Payer: 59 | Admitting: Obstetrics & Gynecology

## 2022-07-26 DIAGNOSIS — O99284 Endocrine, nutritional and metabolic diseases complicating childbirth: Secondary | ICD-10-CM

## 2022-07-26 DIAGNOSIS — O24424 Gestational diabetes mellitus in childbirth, insulin controlled: Secondary | ICD-10-CM

## 2022-07-26 DIAGNOSIS — O99344 Other mental disorders complicating childbirth: Secondary | ICD-10-CM

## 2022-07-26 DIAGNOSIS — Z3A39 39 weeks gestation of pregnancy: Secondary | ICD-10-CM

## 2022-07-26 DIAGNOSIS — O1002 Pre-existing essential hypertension complicating childbirth: Secondary | ICD-10-CM

## 2022-07-26 LAB — CBC WITH DIFFERENTIAL/PLATELET
Abs Immature Granulocytes: 0.09 10*3/uL — ABNORMAL HIGH (ref 0.00–0.07)
Basophils Absolute: 0 10*3/uL (ref 0.0–0.1)
Basophils Relative: 0 %
Eosinophils Absolute: 0 10*3/uL (ref 0.0–0.5)
Eosinophils Relative: 0 %
HCT: 38.7 % (ref 36.0–46.0)
Hemoglobin: 12.3 g/dL (ref 12.0–15.0)
Immature Granulocytes: 1 %
Lymphocytes Relative: 16 %
Lymphs Abs: 2.5 10*3/uL (ref 0.7–4.0)
MCH: 25.4 pg — ABNORMAL LOW (ref 26.0–34.0)
MCHC: 31.8 g/dL (ref 30.0–36.0)
MCV: 80 fL (ref 80.0–100.0)
Monocytes Absolute: 0.6 10*3/uL (ref 0.1–1.0)
Monocytes Relative: 4 %
Neutro Abs: 12.5 10*3/uL — ABNORMAL HIGH (ref 1.7–7.7)
Neutrophils Relative %: 79 %
Platelets: 254 10*3/uL (ref 150–400)
RBC: 4.84 MIL/uL (ref 3.87–5.11)
RDW: 15.5 % (ref 11.5–15.5)
WBC: 15.7 10*3/uL — ABNORMAL HIGH (ref 4.0–10.5)
nRBC: 0 % (ref 0.0–0.2)

## 2022-07-26 LAB — RPR: RPR Ser Ql: NONREACTIVE

## 2022-07-26 LAB — GLUCOSE, CAPILLARY: Glucose-Capillary: 118 mg/dL — ABNORMAL HIGH (ref 70–99)

## 2022-07-26 MED ORDER — PHENYLEPHRINE 80 MCG/ML (10ML) SYRINGE FOR IV PUSH (FOR BLOOD PRESSURE SUPPORT): 80.0000 ug | PREFILLED_SYRINGE | INTRAVENOUS | Status: DC | PRN

## 2022-07-26 MED ORDER — LACTATED RINGERS IV SOLN: 500.0000 mL | Freq: Once | INTRAVENOUS | Status: DC

## 2022-07-26 MED ORDER — DIPHENHYDRAMINE HCL 50 MG/ML IJ SOLN: 12.5000 mg | INTRAMUSCULAR | Status: DC | PRN

## 2022-07-26 MED ORDER — TERBUTALINE SULFATE 1 MG/ML IJ SOLN: 0.2500 mg | Freq: Once | INTRAMUSCULAR | Status: DC | PRN

## 2022-07-26 MED ORDER — TETANUS-DIPHTH-ACELL PERTUSSIS 5-2.5-18.5 LF-MCG/0.5 IM SUSY: 0.5000 mL | PREFILLED_SYRINGE | Freq: Once | INTRAMUSCULAR | Status: DC

## 2022-07-26 MED ORDER — TRANEXAMIC ACID-NACL 1000-0.7 MG/100ML-% IV SOLN
1000.0000 mg | Freq: Once | INTRAVENOUS | Status: AC
  Administered 2022-07-26: 1000 mg via INTRAVENOUS

## 2022-07-26 MED ORDER — ACETAMINOPHEN 325 MG PO TABS
650.0000 mg | ORAL_TABLET | ORAL | Status: DC | PRN
  Administered 2022-07-26 (×2): 650 mg via ORAL
  Filled 2022-07-26 (×3): qty 2

## 2022-07-26 MED ORDER — FENTANYL-BUPIVACAINE-NACL 0.5-0.125-0.9 MG/250ML-% EP SOLN
12.0000 mL/h | EPIDURAL | Status: DC | PRN
  Filled 2022-07-26: qty 250

## 2022-07-26 MED ORDER — FENTANYL-BUPIVACAINE-NACL 0.5-0.125-0.9 MG/250ML-% EP SOLN
EPIDURAL | Status: DC | PRN
  Administered 2022-07-26: 12 mL/h via EPIDURAL

## 2022-07-26 MED ORDER — PRENATAL MULTIVITAMIN CH
1.0000 | ORAL_TABLET | Freq: Every day | ORAL | Status: DC
  Administered 2022-07-26 – 2022-07-28 (×3): 1 via ORAL
  Filled 2022-07-26 (×3): qty 1

## 2022-07-26 MED ORDER — DIBUCAINE (PERIANAL) 1 % EX OINT: 1.0000 | TOPICAL_OINTMENT | CUTANEOUS | Status: DC | PRN

## 2022-07-26 MED ORDER — BENZOCAINE-MENTHOL 20-0.5 % EX AERO
1.0000 | INHALATION_SPRAY | CUTANEOUS | Status: DC | PRN
  Administered 2022-07-26: 1 via TOPICAL
  Filled 2022-07-26: qty 56

## 2022-07-26 MED ORDER — TRANEXAMIC ACID-NACL 1000-0.7 MG/100ML-% IV SOLN
INTRAVENOUS | Status: AC
  Filled 2022-07-26: qty 100

## 2022-07-26 MED ORDER — OXYTOCIN-SODIUM CHLORIDE 30-0.9 UT/500ML-% IV SOLN
1.0000 m[IU]/min | INTRAVENOUS | Status: DC
  Administered 2022-07-26: 2 m[IU]/min via INTRAVENOUS

## 2022-07-26 MED ORDER — TRANEXAMIC ACID-NACL 1000-0.7 MG/100ML-% IV SOLN: 1000.0000 mg | INTRAVENOUS | Status: DC

## 2022-07-26 MED ORDER — ZOLPIDEM TARTRATE 5 MG PO TABS: 5.0000 mg | ORAL_TABLET | Freq: Every evening | ORAL | Status: DC | PRN

## 2022-07-26 MED ORDER — COCONUT OIL OIL: 1.0000 | TOPICAL_OIL | Status: DC | PRN

## 2022-07-26 MED ORDER — SENNOSIDES-DOCUSATE SODIUM 8.6-50 MG PO TABS
2.0000 | ORAL_TABLET | Freq: Every day | ORAL | Status: DC
  Administered 2022-07-27 – 2022-07-28 (×2): 2 via ORAL
  Filled 2022-07-26 (×2): qty 2

## 2022-07-26 MED ORDER — LAMOTRIGINE 150 MG PO TABS
300.0000 mg | ORAL_TABLET | Freq: Every day | ORAL | Status: DC
  Administered 2022-07-26 – 2022-07-28 (×3): 300 mg via ORAL
  Filled 2022-07-26 (×4): qty 2

## 2022-07-26 MED ORDER — SIMETHICONE 80 MG PO CHEW: 80.0000 mg | CHEWABLE_TABLET | ORAL | Status: DC | PRN

## 2022-07-26 MED ORDER — DIPHENHYDRAMINE HCL 25 MG PO CAPS: 25.0000 mg | ORAL_CAPSULE | Freq: Four times a day (QID) | ORAL | Status: DC | PRN

## 2022-07-26 MED ORDER — LABETALOL HCL 200 MG PO TABS
300.0000 mg | ORAL_TABLET | Freq: Two times a day (BID) | ORAL | Status: DC
  Administered 2022-07-26 – 2022-07-28 (×5): 300 mg via ORAL
  Filled 2022-07-26 (×6): qty 1

## 2022-07-26 MED ORDER — ONDANSETRON HCL 4 MG/2ML IJ SOLN: 4.0000 mg | INTRAMUSCULAR | Status: DC | PRN

## 2022-07-26 MED ORDER — LIDOCAINE HCL (PF) 1 % IJ SOLN
INTRAMUSCULAR | Status: DC | PRN
  Administered 2022-07-26: 5 mL via EPIDURAL

## 2022-07-26 MED ORDER — ONDANSETRON HCL 4 MG PO TABS: 4.0000 mg | ORAL_TABLET | ORAL | Status: DC | PRN

## 2022-07-26 MED ORDER — WITCH HAZEL-GLYCERIN EX PADS: 1.0000 | MEDICATED_PAD | CUTANEOUS | Status: DC | PRN

## 2022-07-26 MED ORDER — EPHEDRINE 5 MG/ML INJ: 10.0000 mg | INTRAVENOUS | Status: DC | PRN

## 2022-07-26 MED ORDER — LEVOTHYROXINE SODIUM 50 MCG PO TABS
50.0000 ug | ORAL_TABLET | Freq: Every day | ORAL | Status: DC
  Administered 2022-07-26 – 2022-07-28 (×3): 50 ug via ORAL
  Filled 2022-07-26 (×3): qty 1

## 2022-07-26 MED ORDER — IBUPROFEN 600 MG PO TABS
600.0000 mg | ORAL_TABLET | Freq: Four times a day (QID) | ORAL | Status: DC
  Administered 2022-07-26 – 2022-07-28 (×9): 600 mg via ORAL
  Filled 2022-07-26 (×9): qty 1

## 2022-07-26 NOTE — Anesthesia Preprocedure Evaluation (Signed)
Anesthesia Evaluation  Patient identified by MRN, date of birth, ID band Patient awake    Reviewed: Allergy & Precautions, NPO status , Patient's Chart, lab work & pertinent test results  Airway Mallampati: III  TM Distance: >3 FB Neck ROM: Full    Dental no notable dental hx. (+) Teeth Intact   Pulmonary neg pulmonary ROS   Pulmonary exam normal breath sounds clear to auscultation       Cardiovascular hypertension (cHtn), Pt. on medications Normal cardiovascular exam Rhythm:Regular Rate:Normal     Neuro/Psych  Headaches  Anxiety Depression Bipolar Disorder      GI/Hepatic Neg liver ROS,GERD  ,,  Endo/Other  diabetesHypothyroidism    Renal/GU      Musculoskeletal   Abdominal  (+) + obese (BMI 55.7)  Peds  Hematology Lab Results      Component                Value               Date                HGB                      12.9                07/25/2022                HCT                      39.6                07/25/2022                PLT                      275                 07/25/2022              Anesthesia Other Findings All: Bee venom  Reproductive/Obstetrics (+) Pregnancy                             Anesthesia Physical Anesthesia Plan  ASA: 3  Anesthesia Plan: Epidural   Post-op Pain Management:    Induction:   PONV Risk Score and Plan:   Airway Management Planned:   Additional Equipment:   Intra-op Plan:   Post-operative Plan:   Informed Consent: I have reviewed the patients History and Physical, chart, labs and discussed the procedure including the risks, benefits and alternatives for the proposed anesthesia with the patient or authorized representative who has indicated his/her understanding and acceptance.       Plan Discussed with:   Anesthesia Plan Comments: (39 Wk G3P1 w cHtn, gDm, Bipolar and BMI 55.7 for LEA)       Anesthesia Quick  Evaluation

## 2022-07-26 NOTE — Progress Notes (Signed)
Labor Progress Note Madison Mcgrath is a 32 y.o. G3P1011 at [redacted]w[redacted]d presented for early labor.   S: No acute concerns.   O:  BP 123/78   Pulse 82   Temp 98.3 F (36.8 C) (Oral)   Resp 16   Ht 5\' 5"  (1.651 m)   Wt (!) 151.7 kg   SpO2 98%   BMI 55.65 kg/m  EFM: 125bpm/moderate/+accels, no decels  CVE: Dilation: 8 Effacement (%): 80 Cervical Position: Posterior Station: -2 Presentation: Vertex Exam by:: Brandy Hale RN   A&P: 32 y.o. G3P1011 [redacted]w[redacted]d here for early labor.  #Labor: Progressing well. S/p SROM. Continue pitocin #Pain: Epidural #FWB: Cat I  #GBS negative  cHTN  -Continue Labetalol 300 mg BID   A2GDM -Hold home Metformin 500 mg BID -CBG checks    Hypothyroid -Continue home synthroid 50 mcg every day   Bipolar  -Continue home Lamictal 300mg  every [employer] Autry-Lott, DO 3:45 AM

## 2022-07-26 NOTE — Anesthesia Postprocedure Evaluation (Signed)
Anesthesia Post Note  Patient: Madison Mcgrath  Procedure(s) Performed: AN AD HOC LABOR EPIDURAL     Patient location during evaluation: Mother Baby Anesthesia Type: Epidural Level of consciousness: awake Pain management: satisfactory to patient Vital Signs Assessment: post-procedure vital signs reviewed and stable Respiratory status: spontaneous breathing Cardiovascular status: stable Anesthetic complications: no  No notable events documented.  Last Vitals:  Vitals:   07/26/22 1025 07/26/22 1127  BP: 130/67 129/65  Pulse: 88 88  Resp:  18  Temp:  36.8 C  SpO2:  98%    Last Pain:  Vitals:   07/26/22 1328  TempSrc:   PainSc: 0-No pain   Pain Goal:                   [employer]

## 2022-07-26 NOTE — Anesthesia Procedure Notes (Signed)
Epidural Patient location during procedure: OB Start time: 07/26/2022 2:07 AM End time: 07/26/2022 2:22 AM  Staffing Anesthesiologist: Trevor Iha, MD Performed: anesthesiologist   Preanesthetic Checklist Completed: patient identified, IV checked, site marked, risks and benefits discussed, surgical consent, monitors and equipment checked, pre-op evaluation and timeout performed  Epidural Patient position: sitting Prep: DuraPrep and site prepped and draped Patient monitoring: continuous pulse ox and blood pressure Approach: midline Location: L3-L4 Injection technique: LOR air  Needle:  Needle type: Tuohy  Needle gauge: 17 G Needle length: 9 cm and 9 Needle insertion depth: 9 cm Catheter type: closed end flexible Catheter size: 19 Gauge Catheter at skin depth: 10 and 16 cm Test dose: negative  Assessment Events: blood not aspirated, no cerebrospinal fluid, injection not painful, no injection resistance, no paresthesia and negative IV test  Additional Notes Patient identified. Risks/Benefits/Options discussed with patient including but not limited to bleeding, infection, nerve damage, paralysis, failed block, incomplete pain control, headache, blood pressure changes, nausea, vomiting, reactions to medication both or allergic, itching and postpartum back pain. Confirmed with bedside nurse the patient's most recent platelet count. Confirmed with patient that they are not currently taking any anticoagulation, have any bleeding history or any family history of bleeding disorders. Patient expressed understanding and wished to proceed. All questions were answered. Sterile technique was used throughout the entire procedure. Please see nursing notes for vital signs. Test dose was given through epidural needle and negative prior to continuing to dose epidural or start infusion. Warning signs of high block given to the patient including shortness of breath, tingling/numbness in hands, complete  motor block, or any concerning symptoms with instructions to call for help. Patient was given instructions on fall risk and not to get out of bed. All questions and concerns addressed with instructions to call with any issues.  1 Attempt (S) . Patient tolerated procedure well.

## 2022-07-26 NOTE — Discharge Summary (Signed)
Postpartum Discharge Summary  Date of Service updated***     Patient Name: Madison Mcgrath DOB: 1990/12/15 MRN: [id number]  Date of admission: 07/25/2022 Delivery date:07/26/2022  Delivering provider: Lavonda Jumbo  Date of discharge: 07/26/2022  Admitting diagnosis: Chronic hypertension affecting pregnancy [O10.919] Intrauterine pregnancy: [redacted]w[redacted]d     Secondary diagnosis:  Principal Problem:   Chronic hypertension affecting pregnancy Active Problems:   Vaginal delivery   Shoulder dystocia during labor and delivery  Additional problems: ***    Discharge diagnosis: {DX.:23714}                                              Post partum procedures:{Postpartum procedures:23558} Augmentation: Pitocin Complications: Shoulder dystocia  Hospital course: Onset of Labor With Vaginal Delivery      32 y.o. yo G3P1011 at [redacted]w[redacted]d was admitted in Latent Labor on 07/25/2022. Labor course was complicated by nothing  Membrane Rupture Time/Date: 1:13 AM ,07/26/2022   Delivery Method:Vaginal, Spontaneous  Episiotomy: None  Lacerations:  None  Patient had a postpartum course complicated by ***.  She is ambulating, tolerating a regular diet, passing flatus, and urinating well. Patient is discharged home in stable condition on 07/26/22.  Newborn Data: Birth date:07/26/2022  Birth time:4:38 AM  Gender:Female  Living status:Living  Apgars:9 ,9  Weight:   Magnesium Sulfate received: No BMZ received: No Rhophylac:No MMR:{MMR:30440033} T-DaP:{Tdap:23962} Flu: {WUJ:81191} Transfusion:{Transfusion received:30440034}  Physical exam  Vitals:   07/26/22 0300 07/26/22 0500 07/26/22 0505 07/26/22 0515  BP: 123/78 137/71 (!) 129/54 117/63  Pulse: 82 86 88 81  Resp: 16     Temp:      TempSrc:      SpO2:      Weight:      Height:       General: {Exam; general:21111117} Lochia: {Desc; appropriate/inappropriate:30686::"appropriate"} Uterine Fundus: {Desc; firm/soft:30687} Incision: {Exam;  incision:21111123} DVT Evaluation: {Exam; dvt:2111122} Labs: Lab Results  Component Value Date   WBC 10.8 (H) 07/25/2022   HGB 12.9 07/25/2022   HCT 39.6 07/25/2022   MCV 80.3 07/25/2022   PLT 275 07/25/2022      Latest Ref Rng & Units 01/26/2022   11:17 AM  CMP  Glucose 70 - 99 mg/dL 478   BUN 6 - 20 mg/dL 6   Creatinine 2.95 - 6.21 mg/dL 3.08   Sodium 657 - 846 mmol/L 137   Potassium 3.5 - 5.2 mmol/L 4.2   Chloride 96 - 106 mmol/L 101   CO2 20 - 29 mmol/L 18   Calcium 8.7 - 10.2 mg/dL 9.7   Total Protein 6.0 - 8.5 g/dL 6.6   Total Bilirubin 0.0 - 1.2 mg/dL <9.6   Alkaline Phos 44 - 121 IU/L 67   AST 0 - 40 IU/L 8   ALT 0 - 32 IU/L 10    Edinburgh Score:    03/15/2021   10:30 AM  Edinburgh Postnatal Depression Scale Screening Tool  I have been able to laugh and see the funny side of things. 0  I have looked forward with enjoyment to things. 1  I have blamed myself unnecessarily when things went wrong. 2  I have been anxious or worried for no good reason. 2  I have felt scared or panicky for no good reason. 0  Things have been getting on top of me. 0  I have been so unhappy that  I have had difficulty sleeping. 0  I have felt sad or miserable. 1  I have been so unhappy that I have been crying. 1  The thought of harming myself has occurred to me. 0  Edinburgh Postnatal Depression Scale Total 7     After visit meds:  Allergies as of 07/26/2022       Reactions   Bee Venom Rash     Med Rec must be completed prior to using this [facility]***        Discharge home in stable condition Infant Feeding: {Baby feeding:23562} Infant Disposition:{CHL IP OB HOME WITH ZOXWRU:04540} Discharge instruction: per After Visit Summary and Postpartum booklet. Activity: Advance as tolerated. Pelvic rest for 6 weeks.  Diet: {OB JWJX:91478295} Future Appointments:No future appointments. Follow up Visit:  Message sent to [facility] by Autry-Lott on 07/26/2022  Please schedule this  patient for a In person postpartum visit in 6 weeks with the following provider: MD and APP. Additional Postpartum F/U:2 hour GTT in 6 weeks and BP check 1 week  High risk pregnancy complicated by:  cHTN, A2GDM, hypothyroidism, bipolar disorder, BMI 55 Delivery mode:  Vaginal, Spontaneous  Anticipated Birth Control:  Nexplanon   07/26/2022 Simone Autry-Lott, DO

## 2022-07-27 ENCOUNTER — Encounter (HOSPITAL_COMMUNITY): Payer: Self-pay | Admitting: Obstetrics & Gynecology

## 2022-07-27 DIAGNOSIS — Z30017 Encounter for initial prescription of implantable subdermal contraceptive: Secondary | ICD-10-CM

## 2022-07-27 HISTORY — PX: SUBDERMAL ETONOGESTREL IMPLANT INSERTION: PRO7525

## 2022-07-27 MED ORDER — ETONOGESTREL 68 MG ~~LOC~~ IMPL
68.0000 mg | DRUG_IMPLANT | Freq: Once | SUBCUTANEOUS | Status: AC
  Administered 2022-07-27: 68 mg via SUBCUTANEOUS
  Filled 2022-07-27: qty 1

## 2022-07-27 MED ORDER — LIDOCAINE HCL 1 % IJ SOLN
0.0000 mL | Freq: Once | INTRAMUSCULAR | Status: AC | PRN
  Administered 2022-07-27: 20 mL via INTRADERMAL
  Filled 2022-07-27: qty 20
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
## 2022-07-27 NOTE — Progress Notes (Signed)
Post Partum Day 1 Subjective: Eating, drinking, voiding, ambulating well.  +flatus.  Lochia and pain wnl.  Denies dizziness, lightheadedness, or sob. No complaints. Denies ha, visual changes, ruq/epigastric pain, n/v. Does not want to be d/c'd today  Objective: Blood pressure 116/60, pulse 81, temperature 98.7 F (37.1 C), temperature source Oral, resp. rate 16, height 5\' 5"  (1.651 m), weight (!) 151.7 kg, SpO2 95 %, unknown if currently breastfeeding.  Physical Exam:  General: alert, cooperative, and no distress Lochia: appropriate Uterine Fundus: firm Incision: n/a DVT Evaluation: No evidence of DVT seen on physical exam. Negative Homan's sign. No cords or calf tenderness. No significant calf/ankle edema.  Recent Labs    07/25/22 2205 07/26/22 0638  HGB 12.9 12.3  HCT 39.6 38.7    Assessment/Plan: Plan for discharge tomorrow, Breastfeeding, Lactation consult, Circumcision prior to discharge, and Contraception wants Nexplanon prior to d/c   LOS: 2 days   Cheral Marker, CNM 07/27/2022, 7:18 AM   CNM Circumcision Counseling Progress Note  Patient desires circumcision for her female infant.  Circumcision procedure details discussed, risks and benefits of procedure were also discussed.  These include but are not limited to: Benefits of circumcision in men include reduction in the rates of urinary tract infection (UTI), penile cancer, some sexually transmitted infections, penile inflammatory and retractile disorders, as well as easier hygiene.  Risks include bleeding , infection, injury of glans which may lead to penile deformity or urinary tract issues, unsatisfactory cosmetic appearance and other potential complications related to the procedure.  It was emphasized that this is an elective procedure.  Patient wants to proceed with circumcision; written informed consent will be obtained.  Will have MD do circumcision when infant is cleared for such by peds.  Joellyn Haff, CNM,  [facility] 07/27/2022   7:19 AM

## 2022-07-27 NOTE — Procedures (Signed)
PROCEDURE DATE:07/27/2022   PRE & POST PROCEDURAL DIAGNOSES: Unwanted fertility   PROCEDURE NOTE: Nexplanon insertion Patient given informed consent, signed copy in the chart.  Appropriate time out taken.  The patient is PPD1 therefore reasonable sure she is not pregnant.   The patient's left arm was prepped in the usual sterile fashion. Local anaesthesia obtained using 1 cc of 1% lidocaine with epinephrine and injected 8 cm from medial epicondyle of the elbow. Nexplanon was inserted per manufacturer's directions. Less than 1 cc blood loss. The insertion site covered with steri strips  and a pressure bandage to minimize bruising. There were no complications and the patient tolerated the procedure well.  Device information was given in handout form. Patient is informed the removal date will be in three years and package insert card filled out and given to her.   Exp # : 2026/04 Lot # : [id number]  Lavonda Jumbo, DO OB Fellow, Faculty Practice [facility] 07/27/2022, 12:53 PM

## 2022-07-28 ENCOUNTER — Encounter: Payer: Self-pay | Admitting: Women's Health

## 2022-07-28 MED ORDER — NIFEDIPINE ER OSMOTIC RELEASE 30 MG PO TB24: 30.0000 mg | ORAL_TABLET | Freq: Every day | ORAL | 1 refills | Status: AC

## 2022-07-28 MED ORDER — FUROSEMIDE 20 MG PO TABS: 20.0000 mg | ORAL_TABLET | Freq: Every day | ORAL | 0 refills | Status: DC

## 2022-07-28 MED ORDER — IBUPROFEN 600 MG PO TABS: 600.0000 mg | ORAL_TABLET | Freq: Four times a day (QID) | ORAL | 0 refills | Status: AC

## 2022-07-28 NOTE — Social Work (Signed)
CSW received verification of custody letter for [facility]. Infant will be discharged to [facility] and [facility]. Barriers to infants discharge resolved.  Wende Neighbors, LCSWA Clinical Social Worker [phone]

## 2022-07-28 NOTE — Plan of Care (Signed)
Complete

## 2022-07-28 NOTE — Progress Notes (Addendum)
Placement information  [facility] [address] [place], [place] [address]  CSW has placed the copies of the ID's in MOB's and infant's chart.   Lorrena Goranson, LCSWA Clinical Social Worker [phone] 

## 2022-07-29 ENCOUNTER — Other Ambulatory Visit: Payer: 59

## 2022-08-02 ENCOUNTER — Encounter: Payer: Self-pay | Admitting: Women's Health

## 2022-08-16 ENCOUNTER — Other Ambulatory Visit: Payer: Self-pay | Admitting: Advanced Practice Midwife

## 2022-08-22 ENCOUNTER — Telehealth (HOSPITAL_COMMUNITY): Payer: Self-pay | Admitting: *Deleted

## 2022-08-22 NOTE — Telephone Encounter (Signed)
Attempted hospital discharge follow-up call. Left message for patient to return RN call with any questions or concerns. Deforest Hoyles, RN, 08/22/22, [phone]

## 2022-09-06 ENCOUNTER — Ambulatory Visit (INDEPENDENT_AMBULATORY_CARE_PROVIDER_SITE_OTHER): Payer: 59 | Admitting: Women's Health

## 2022-09-06 ENCOUNTER — Encounter: Payer: Self-pay | Admitting: Women's Health

## 2022-09-06 ENCOUNTER — Other Ambulatory Visit: Payer: 59

## 2022-09-06 DIAGNOSIS — I10 Essential (primary) hypertension: Secondary | ICD-10-CM

## 2022-09-06 DIAGNOSIS — Z8632 Personal history of gestational diabetes: Secondary | ICD-10-CM

## 2022-09-06 DIAGNOSIS — Z131 Encounter for screening for diabetes mellitus: Secondary | ICD-10-CM

## 2022-09-06 DIAGNOSIS — Z30017 Encounter for initial prescription of implantable subdermal contraceptive: Secondary | ICD-10-CM | POA: Insufficient documentation

## 2022-09-06 NOTE — Patient Instructions (Signed)
Start nifedipine 30mg  daily again until you see your doctor next week

## 2022-09-06 NOTE — Progress Notes (Signed)
POSTPARTUM VISIT Patient name: Madison Mcgrath MRN [id number]  Date of birth: 05/31/1990 Chief Complaint:   Postpartum Care  History of Present Illness:   Madison Mcgrath is a 32 y.o. G71P2012 Caucasian female being seen today for a postpartum visit. She is 6 weeks postpartum following a spontaneous vaginal delivery at 39.0 gestational weeks. IOL: no, for n/a. Anesthesia: epidural.  Laceration: none.  Complications: shoulder dystocia resolved by McRoberts and suprapubic pressure. Inpatient contraception: yes Nexplanon inserted 7/3 .   Pregnancy complicated by CHTN, A2DM, obesity, bipolar . Tobacco use: no. Substance use disorder: no. Last pap smear: 01/26/22 and results were NILM w/ HRHPV negative. Next pap smear due: 2027 No LMP recorded.  Postpartum course has been uncomplicated. Bleeding none. Bowel function is normal. Bladder function is  some incontinence . Urinary incontinence? Yes, fecal incontinence? no Patient is not sexually active. Last sexual activity: prior to birth of baby. Desired contraception: Nexplanon placed at hospital  . Patient does not know want a pregnancy in the future.  Desired family size is uncertain #of children.   Upstream - 09/06/22 0901       Pregnancy Intention Screening   Does the patient want to become pregnant in the next year? No    Does the patient's partner want to become pregnant in the next year? No    Would the patient like to discuss contraceptive options today? No      Contraception Wrap Up   Current Method Hormonal Implant   Nexplanon   End Method Hormonal Implant   Nexplanon   Contraception Counseling Provided No            The pregnancy intention screening data noted above was reviewed. Potential methods of contraception were discussed. The patient elected to proceed with Hormonal Implant (Nexplanon).  Edinburgh Postpartum Depression Screening: negative, on prozac 20mg  daily, sees [facility] in [place] last week and goes back this week.    Edinburgh Postnatal Depression Scale - 09/06/22 0901       Edinburgh Postnatal Depression Scale:  In the Past 7 Days   I have been able to laugh and see the funny side of things. 1    I have looked forward with enjoyment to things. 0    I have blamed myself unnecessarily when things went wrong. 0    I have been anxious or worried for no good reason. 0    I have felt scared or panicky for no good reason. 0    Things have been getting on top of me. 0    I have been so unhappy that I have had difficulty sleeping. 0    I have felt sad or miserable. 0    I have been so unhappy that I have been crying. 0    The thought of harming myself has occurred to me. 0    Edinburgh Postnatal Depression Scale Total 1                01/26/2022    9:51 AM 03/15/2021   10:35 AM 06/18/2020    9:51 AM  GAD 7 : Generalized Anxiety Score  Nervous, Anxious, on Edge 0 1 0  Control/stop worrying 0 0 1  Worry too much - different things 0 2 1  Trouble relaxing 0 0 0  Restless 0 0 0  Easily annoyed or irritable 0 3 1  Afraid - awful might happen 0 2 0  Total GAD 7 Score 0 8 3  Baby's course has been uncomplicated. Baby is feeding by bottle. Infant has a pediatrician/family doctor? Yes.  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  Pt has material needs met for her and baby: Yes.   Review of Systems:   Pertinent items are noted in HPI Denies Abnormal vaginal discharge w/ itching/odor/irritation, headaches, visual changes, shortness of breath, chest pain, abdominal pain, severe nausea/vomiting, or problems with urination or bowel movements. Pertinent History Reviewed:  Reviewed past medical,surgical, obstetrical and family history.  Reviewed problem list, medications and allergies. OB History  Gravida Para Term Preterm AB Living  3 2 2  0 1 2  SAB IAB Ectopic Multiple Live Births  1 0 0 0 2    # Outcome Date GA Lbr Len/2nd Weight Sex Type Anes PTL Lv  3 Term 07/26/22  [redacted]w[redacted]d 03:20 / 00:05 7 lb 13 oz (3.543 kg) M Vag-Spont EPI  LIV  2 Term 12/07/20 [redacted]w[redacted]d  8 lb 5 oz (3.771 kg) F Vag-Spont EPI N LIV     Complications: Gestational diabetes  1 SAB            Physical Assessment:   Vitals:   09/06/22 0855  BP: (!) 150/88  Pulse: 77  Weight: (!) 330 lb (149.7 kg)  Height: 5\' 5"  (1.651 m)  Body mass index is 54.91 kg/m.       Physical Examination:   General appearance: alert, well appearing, and in no distress  Mental status: alert, oriented to person, place, and time  Skin: warm & dry   Cardiovascular: normal heart rate noted   Respiratory: normal respiratory effort, no distress   Breasts: deferred, no complaints   Abdomen: soft, non-tender   Pelvic: examination not indicated. Thin prep pap obtained: No  Rectal: not examined  Extremities: Edema: none   Chaperone: N/A         No results found for this or any previous visit (from the past 24 hour(s)).  Assessment & Plan:  1) Postpartum exam 2) 6 wks s/p spontaneous vaginal delivery 3) bottle feeding 4) Depression screening 5) Contraception> Nexplanon placed 7/3 6) CHTN> on losartan prior to pregnancy, was on labetalol during pregnancy then d/c'd on lasix x 5d, labetalol 300mg  BID, and nifedipine 30mg  daily. Stopped all b/c was unsure of what to take. To resume nifedipine 30mg  daily until appt w/ PCP next Tuesday 7) Hypothyroidism> on synthroid prior to pregnancy, increased to during pregnancy. Stopped taking, needs refill. Just had labs drawn for PCP, sees them next Tuesday so will f/u w/ them about resuming/dosage 8) A2DM during pregnancy> ate this am, reschedule 2hr GTT for next week 9) Bipolar> on prozac, followed by [facility] in [place], goes back this week 10) Urinary incontinence> Kegels x , if not improved let us know and will refer to pelvic floor PT  Essential components of care per ACOG recommendations:  1.  Mood and well being:  If positive depression screen, discussed  and plan developed.  If using tobacco we discussed reduction/cessation and risk of relapse If current substance abuse, we discussed and referral to local resources was offered.   2. Infant care and feeding:  If breastfeeding, discussed returning to work, pumping, breastfeeding-associated pain, guidance regarding return to fertility while lactating if not using another method. If needed, patient was provided with a letter to be allowed to pump q 2-3hrs to support lactation in a private location with access to a refrigerator to store breastmilk.   Recommended that all  caregivers be immunized for flu, pertussis and other preventable communicable diseases If pt does not have material needs met for her/baby, referred to local resources for help obtaining these.  3. Sexuality, contraception and birth spacing Provided guidance regarding sexuality, management of dyspareunia, and resumption of intercourse Discussed avoiding interpregnancy interval <3mths and recommended birth spacing of 18 months  4. Sleep and fatigue Discussed coping options for fatigue and sleep disruption Encouraged family/partner/community support of 4 hrs of uninterrupted sleep to help with mood and fatigue  5. Physical recovery  If pt had a C/S, assessed incisional pain and providing guidance on normal vs prolonged recovery If pt had a laceration, perineal healing and pain reviewed.  If urinary or fecal incontinence, discussed management and referred to PT or uro/gyn if indicated  Patient is safe to resume physical activity. Discussed attainment of healthy weight.  6.  Chronic disease management Discussed pregnancy complications if any, and their implications for future childbearing and long-term maternal health. Review recommendations for prevention of recurrent pregnancy complications, such as 17 hydroxyprogesterone caproate to reduce risk for recurrent PTB not applicable, or aspirin to reduce risk of preeclampsia yes. Pt  had GDM: yes. If yes, 2hr GTT scheduled: yes. Reviewed medications and non-pregnant dosing including consideration of whether pt is breastfeeding using a reliable resource such as LactMed: yes Referred for f/u w/ PCP or subspecialist providers as indicated: yes  7. Health maintenance Mammogram at 34yo or earlier if indicated Pap smears as indicated  Meds: No orders of the defined types were placed in this encounter.   Follow-up: Return for next week 2hr pp GTT, then 20yr physical.   No orders of the defined types were placed in this encounter.   Cheral Marker CNM, [facility] 09/06/2022 9:34 AM

## 2022-09-14 ENCOUNTER — Other Ambulatory Visit: Payer: 59

## 2022-09-29 ENCOUNTER — Encounter: Payer: Self-pay | Admitting: Women's Health

## 2023-03-07 ENCOUNTER — Encounter: Payer: Self-pay | Admitting: Women's Health

## 2023-03-07 ENCOUNTER — Ambulatory Visit: Payer: 59 | Admitting: Women's Health

## 2023-03-07 VITALS — BP 138/91 | HR 82 | Ht 65.0 in | Wt 357.8 lb

## 2023-03-07 DIAGNOSIS — Z3009 Encounter for other general counseling and advice on contraception: Secondary | ICD-10-CM

## 2023-03-07 DIAGNOSIS — Z975 Presence of (intrauterine) contraceptive device: Secondary | ICD-10-CM | POA: Diagnosis not present

## 2023-03-07 DIAGNOSIS — I10 Essential (primary) hypertension: Secondary | ICD-10-CM

## 2023-03-07 DIAGNOSIS — N921 Excessive and frequent menstruation with irregular cycle: Secondary | ICD-10-CM

## 2023-03-07 NOTE — Progress Notes (Signed)
GYN VISIT Patient name: Madison Mcgrath MRN [id number]  Date of birth: [birth year] Chief Complaint:   nexplanon removal (Want pills/ no period 2 month)  History of Present Illness:   Madison Mcgrath is a 33 y.o. G36P2012 Caucasian female being seen today initially for Nexplanon removal. Nexplanon placed 07/27/22. No period in 2 mths, then started to bleed some. Got scared b/c in past had 2 episodes of heavy bleeding and feeling weak w/ Nexplanon. Wanted COCs, however has CHTN and bp elevated, not on meds. States PCP said bp was good and took her off meds. Returns in couple of weeks. Discussed [facility] not an option d/t elevated bp. Reviewed options of megace prn if does have heavy bleeding w/ Nexplanon vs progestin only methods (POPs, depo, IUD). Decided to keep Nexplanon.  No LMP recorded. The current method of family planning is Nexplanon.  Last pap 01/26/22. Results were: NILM w/ HRHPV negative     01/26/2022    9:50 AM 03/15/2021   10:34 AM 10/14/2020   11:04 AM 07/29/2020   10:24 AM 07/08/2020    1:54 PM  Depression screen PHQ 2/9  Decreased Interest 0 0 0 0 0  Down, Depressed, Hopeless 0 0 0 1 0  PHQ - 2 Score 0 0 0 1 0  Altered sleeping 0 0     Tired, decreased energy 0 3     Change in appetite 0 3     Feeling bad or failure about yourself  0 0     Trouble concentrating 0 2     Moving slowly or fidgety/restless 0 0     Suicidal thoughts 0 0     PHQ-9 Score 0 8           01/26/2022    9:51 AM 03/15/2021   10:35 AM 06/18/2020    9:51 AM  GAD 7 : Generalized Anxiety Score  Nervous, Anxious, on Edge 0 1 0  Control/stop worrying 0 0 1  Worry too much - different things 0 2 1  Trouble relaxing 0 0 0  Restless 0 0 0  Easily annoyed or irritable 0 3 1  Afraid - awful might happen 0 2 0  Total GAD 7 Score 0 8 3     Review of Systems:   Pertinent items are noted in HPI Denies fever/chills, dizziness, headaches, visual disturbances, fatigue, shortness of breath, chest pain, abdominal pain,  vomiting, abnormal vaginal discharge/itching/odor/irritation, problems with periods, bowel movements, urination, or intercourse unless otherwise stated above.  Pertinent History Reviewed:  Reviewed past medical,surgical, social, obstetrical and family history.  Reviewed problem list, medications and allergies. Physical Assessment:   Vitals:   03/07/23 1406 03/07/23 1424  BP: (!) 136/93 (!) 138/91  Pulse: (!) 105 82  Weight: (!) 357 lb 12.8 oz (162.3 kg)   Height: 5\' 5"  (1.651 m)   Body mass index is 59.54 kg/m.       Physical Examination:   General appearance: alert, well appearing, and in no distress  Mental status: alert, oriented to person, place, and time  Skin: warm & dry   Cardiovascular: normal heart rate noted  Respiratory: normal respiratory effort, no distress  Abdomen: soft, non-tender   Pelvic: examination not indicated  Extremities: no edema   Chaperone: N/A    No results found for this or any previous visit (from the past 24 hours).  Assessment & Plan:  1) BTB w/ Nexplanon> discussed no periods, then some bleeding is normal w/  Nexplanon. If does become heavy (changing a pad/hr, feeling weak, etc) to let us know and will rx megace  2) CHTN> no meds currently, keep f/u w/ PCP in couple of weeks and let them know what bp's were here  Meds: No orders of the defined types were placed in this encounter.   No orders of the defined types were placed in this encounter.   Return in about 1 year (around 03/06/2024) for Physical.  Cheral Marker CNM, [facility] 03/07/2023 2:35 PM

## 2023-03-09 ENCOUNTER — Other Ambulatory Visit (HOSPITAL_COMMUNITY): Payer: Self-pay

## 2023-03-22 DIAGNOSIS — I10 Essential (primary) hypertension: Secondary | ICD-10-CM | POA: Diagnosis not present

## 2023-03-22 DIAGNOSIS — R0683 Snoring: Secondary | ICD-10-CM | POA: Diagnosis not present

## 2023-03-22 DIAGNOSIS — E039 Hypothyroidism, unspecified: Secondary | ICD-10-CM | POA: Diagnosis not present

## 2023-03-22 DIAGNOSIS — E782 Mixed hyperlipidemia: Secondary | ICD-10-CM | POA: Diagnosis not present

## 2023-03-22 DIAGNOSIS — K219 Gastro-esophageal reflux disease without esophagitis: Secondary | ICD-10-CM | POA: Diagnosis not present

## 2023-03-22 DIAGNOSIS — Z79899 Other long term (current) drug therapy: Secondary | ICD-10-CM | POA: Diagnosis not present

## 2023-03-22 DIAGNOSIS — R7303 Prediabetes: Secondary | ICD-10-CM | POA: Diagnosis not present

## 2023-05-30 ENCOUNTER — Encounter: Payer: Self-pay | Admitting: Women's Health

## 2023-08-09 ENCOUNTER — Encounter: Payer: Self-pay | Admitting: Women's Health

## 2023-09-15 ENCOUNTER — Encounter: Payer: Self-pay | Admitting: Radiology

## 2023-10-05 ENCOUNTER — Encounter: Admitting: Advanced Practice Midwife

## 2023-11-27 ENCOUNTER — Encounter: Payer: Self-pay | Admitting: Radiology
